# Patient Record
Sex: Male | Born: 1965 | Race: White | Hispanic: No | Marital: Single | Smoking: Former smoker
Health system: Southern US, Community
[De-identification: ages and names within clinical notes are randomized; demographics above are authoritative.]

## PROBLEM LIST (undated history)

## (undated) DIAGNOSIS — K429 Umbilical hernia without obstruction or gangrene: Secondary | ICD-10-CM

## (undated) DIAGNOSIS — G5790 Unspecified mononeuropathy of unspecified lower limb: Secondary | ICD-10-CM

## (undated) DIAGNOSIS — Z789 Other specified health status: Secondary | ICD-10-CM

## (undated) DIAGNOSIS — J302 Other seasonal allergic rhinitis: Secondary | ICD-10-CM

## (undated) DIAGNOSIS — S99929A Unspecified injury of unspecified foot, initial encounter: Secondary | ICD-10-CM

## (undated) HISTORY — DX: Umbilical hernia without obstruction or gangrene: K42.9

## (undated) HISTORY — DX: Unspecified mononeuropathy of unspecified lower limb: G57.90

## (undated) HISTORY — DX: Unspecified injury of unspecified foot, initial encounter: S99.929A

## (undated) HISTORY — DX: Other seasonal allergic rhinitis: J30.2

## (undated) HISTORY — PX: FOOT SURGERY: SHX648

---

## 2001-03-18 HISTORY — PX: FOOT ARTHROPLASTY: SHX1657

## 2001-05-27 ENCOUNTER — Emergency Department (HOSPITAL_COMMUNITY): Admission: EM | Admit: 2001-05-27 | Discharge: 2001-05-27 | Payer: Self-pay

## 2001-06-03 ENCOUNTER — Encounter: Payer: Self-pay | Admitting: Orthopedic Surgery

## 2001-06-03 ENCOUNTER — Observation Stay (HOSPITAL_COMMUNITY): Admission: RE | Admit: 2001-06-03 | Discharge: 2001-06-04 | Payer: Self-pay | Admitting: Orthopedic Surgery

## 2001-06-04 ENCOUNTER — Encounter: Payer: Self-pay | Admitting: Orthopedic Surgery

## 2001-07-01 ENCOUNTER — Inpatient Hospital Stay (HOSPITAL_COMMUNITY): Admission: EM | Admit: 2001-07-01 | Discharge: 2001-07-03 | Payer: Self-pay | Admitting: Emergency Medicine

## 2001-07-01 ENCOUNTER — Encounter: Payer: Self-pay | Admitting: Emergency Medicine

## 2001-07-03 ENCOUNTER — Encounter: Payer: Self-pay | Admitting: Internal Medicine

## 2003-03-19 HISTORY — PX: EYE SURGERY: SHX253

## 2008-07-04 ENCOUNTER — Encounter: Payer: Self-pay | Admitting: Family Medicine

## 2009-08-17 ENCOUNTER — Encounter: Payer: Self-pay | Admitting: Family Medicine

## 2010-02-22 ENCOUNTER — Ambulatory Visit: Payer: Self-pay | Admitting: Family Medicine

## 2010-02-22 DIAGNOSIS — G5790 Unspecified mononeuropathy of unspecified lower limb: Secondary | ICD-10-CM | POA: Insufficient documentation

## 2010-02-22 DIAGNOSIS — G609 Hereditary and idiopathic neuropathy, unspecified: Secondary | ICD-10-CM

## 2010-04-17 NOTE — Letter (Signed)
Summary: Patient Questionnaire  Patient Questionnaire   Imported By: Beau Fanny 02/22/2010 14:54:33  _____________________________________________________________________  External Attachment:    Type:   Image     Comment:   External Document

## 2010-04-17 NOTE — Letter (Signed)
Summary: [facility] Records  [facility] Records   Imported By: Beau Fanny 01/25/2010 13:55:42  _____________________________________________________________________  External Attachment:    Type:   Image     Comment:   External Document

## 2010-04-19 NOTE — Assessment & Plan Note (Signed)
Summary: NEW PT TO EST/CLE   Vital Signs:  Patient profile:   45 year old male Height:      68 inches Weight:      192.50 pounds BMI:     29.38 Temp:     97.5 degrees F oral Pulse rate:   72 / minute Pulse rhythm:   regular BP sitting:   136 / 88  (left arm) Cuff size:   regular  Vitals Entered By: Lewanda Rife LPN (February 22, 2010 11:37 AM)  Serial Vital Signs/Assessments:  Time      Position  BP       Pulse  Resp  Temp     By                     132/85                         Judith Part MD  CC: New pt to establish   History of Present Illness: here as a new pt  used to go to [place] [facility] -- does not remember who he saw -- she was moving back to [place] last month  just there for a year   is on disability for injury  had a beam fall on his foot (2 ton)  multiples fractures and surgeries  no further surgeries are planned  is on gabapentin -- and does fairly well with that  has been on it since 03 since the injury  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 if asthma   136/88 first check - and hx HTN family   some upper body exercise limited by his foot   last screening labs years ago  is not interested in health mt yet- perhaps in the future   is feeling ok now    Preventive Screening-Counseling & Management  Caffeine-Diet-Exercise     Does Patient Exercise: yes      Drug Use:  no.    Allergies (verified): 1)  ! Penicillin  Past History:  Family History: Last updated: 02/22/2010 Father living High blood pressure Mother living High blood pressure, diabetes Paternal grand mother: Heart disease Paternal grandfather: Heart disease uncle with cancer (? kind)   Social History: Last updated: 02/22/2010 Occupation:Employed self -- uses machine to  clean parking lots  Divorced Alcohol use-yes Drug use-no Regular exercise-yes quit smoking after 3 years -- many years ago in his 99s some upper body exercise   Risk Factors: Exercise: yes (02/22/2010)  Past Medical History: nasal seasonal allergies with cough  foot injury - partially disabled from  chronic neuropathy both legs -- thinks it was from working on hard floors and favoring R foot    ortho-- Dr Dareen Piano (for his foot)   Past Surgical History: 2003 - foot surgery with plate put in big toe and pin in 2nd toe due to 2 ton beam falling on foot. (pt had 2 surgeries on foot).  Family History: Father living High blood pressure Mother living High blood pressure, diabetes Paternal grand mother: Heart disease Paternal grandfather: Heart disease uncle with cancer (? 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 Eyes:  Denies blurring and eye irritation. CV:  Denies chest pain or discomfort and lightheadness. Resp:  Denies cough, shortness of breath, and wheezing. GI:  Denies abdominal pain, change in bowel habits, indigestion, and nausea. MS:  Complains of joint pain and stiffness; denies joint redness, joint swelling, and cramps. Derm:  Denies lesion(s), poor wound healing, and rash. Neuro:  Complains of numbness and tingling; denies headaches. Psych:  mood is generally ok . Endo:  Denies cold intolerance, excessive thirst, excessive urination, and heat intolerance. Heme:  Denies abnormal bruising and bleeding.  Physical Exam  General:  overweight but generally well appearing   Head:  normocephalic, atraumatic, and no abnormalities  observed.   Eyes:  vision grossly intact, pupils equal, pupils round, and pupils reactive to light.  no conjunctival pallor, injection or icterus  Mouth:  pharynx pink and moist.   Neck:  supple with full rom and no masses or thyromegally, no JVD or carotid bruit  Lungs:  Normal respiratory effort, chest expands symmetrically. Lungs are clear to auscultation, no crackles or wheezes. Heart:  Normal rate and regular rhythm. S1 and S2 normal without gallop, murmur, click, rub or other extra sounds. Abdomen:  Bowel sounds positive,abdomen soft and non-tender without masses, organomegaly or hernias noted. no renal bruits  Msk:  surgical changes and hyperpigmentation noted on L foot and first toe with high arch limited rom Pulses:  R and L carotid,radial,femoral,dorsalis pedis and posterior tibial pulses are full and equal bilaterally Extremities:  no CCE Neurologic:  dec sens to light touch in L foot   DTRs symmetrical and normal.   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 One capsule by mouth three times a day. 2)  Vitamin B-12 500 Mcg Tabs (Cyanocobalamin) .... One tablet by mouth twice a day 3)  Vitamin D 400 Unit Tabs (Cholecalciferol) .... Take 1 tablet by  mouth once a day 4)  Calcium Carbonate-vitamin D 600-400 Mg-unit Tabs (Calcium carbonate-vitamin d) .... One tablet by mouth twice a day 5)  Vitamin E 400 Unit Caps (Vitamin e) .... Take 1 capsule by mouth once a day  Contraindications/Deferment of Procedures/Staging:    Test/Procedure: FLU VAX    Reason for deferment: patient declined   Patient Instructions: 1)  please send for last 2 prog notes from the [place] clinic  2)  try to keep up good exercise to prevent high blood pressure 3)  also watch salt in diet    Orders Added: 1)  New Patient Level III [16109]    Current Allergies (reviewed today): ! PENICILLIN   Preventive Care Screening  Last Tetanus Booster:    Date:  05/16/2001    Results:  Td

## 2010-04-19 NOTE — Letter (Signed)
Summary: [facility] Records  [facility] Records   Imported By: Beau Fanny 03/01/2010 09:38:38  _____________________________________________________________________  External Attachment:    Type:   Image     Comment:   External Document

## 2010-04-25 ENCOUNTER — Ambulatory Visit: Payer: Self-pay | Admitting: Family Medicine

## 2010-04-25 DIAGNOSIS — Z0289 Encounter for other administrative examinations: Secondary | ICD-10-CM

## 2010-04-30 ENCOUNTER — Encounter: Payer: Self-pay | Admitting: Family Medicine

## 2010-04-30 ENCOUNTER — Ambulatory Visit (INDEPENDENT_AMBULATORY_CARE_PROVIDER_SITE_OTHER): Payer: MEDICARE | Admitting: Family Medicine

## 2010-04-30 DIAGNOSIS — G609 Hereditary and idiopathic neuropathy, unspecified: Secondary | ICD-10-CM

## 2010-05-09 NOTE — Assessment & Plan Note (Signed)
Vital Signs:  Patient profile:   45 year old male Height:      68 inches Weight:      195.50 pounds BMI:     29.83 Temp:     98.2 degrees F oral Pulse rate:   84 / minute Pulse rhythm:   regular BP sitting:   158 / 106  (left arm) Cuff size:   regular  Vitals Entered By: Lewanda Rife LPN (April 30, 2010 2:32 PM)  Serial Vital Signs/Assessments:  Time      Position  BP       Pulse  Resp  Temp     By                     144/88                         Judith Part MD  CC: Left foot pain, injured in 2003. Pt said toes on lt foot hurt to touch at times. Pt thinks neuropathy. Comments Pt said BP up due to pt rushing.   History of Present Illness: here for foot pain and neuropathy in past gabapentin was improving it  for past 2 weeks going more into first toe-- more sensitive to the touch right on the tip  was told by his prev surgery that he needs to get into neurologist to have a block  last nerve block was behind his knee- and it did help  this bothers him more when up and about  ? if he can take extra pill     was by a Dr Yolanda Bonine in [place] -- ? what specialty   did have his orthotics re done- that is helpful  Dr Dareen Piano is his ortho       bp is up - pt says he was rushing to get here  Allergies: 1)  ! Penicillin  Past History:  Past Medical History: Last updated: 02/22/2010 nasal seasonal allergies with cough  foot injury - partially disabled from  chronic neuropathy both legs -- thinks it was from working on hard floors and favoring R foot    ortho-- Dr Dareen Piano (for his foot)   Past Surgical History: Last updated: 02/22/2010 2003 - foot surgery with plate put in big toe and pin in 2nd toe due to 2 ton beam falling on foot. (pt had 2 surgeries on foot).  Family History: Last updated: 02/22/2010 Father living High blood pressure Mother living High blood pressure, diabetes Paternal grand mother: Heart disease Paternal grandfather: Heart  disease uncle with cancer (? 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 Eyes:  Denies blurring and eye irritation. CV:  Denies chest pain or discomfort, lightheadness, and palpitations. Resp:  Denies cough and wheezing. GI:  Denies abdominal pain and change in bowel habits. GU:  Denies dysuria and incontinence. MS:  Complains of joint pain; denies joint redness and joint swelling. Derm:  Denies rash. Neuro:  Complains of numbness and tingling; denies tremors. Heme:  Denies abnormal bruising and bleeding.  Physical Exam  General:  overweight but generally well appearing   Head:  normocephalic, atraumatic,  and no abnormalities observed.   Eyes:  vision grossly intact, pupils equal, pupils round, and pupils reactive to light.  no conjunctival pallor, injection or icterus  Neck:  supple with full rom and no masses or thyromegally, no JVD or carotid bruit  Lungs:  Normal respiratory effort, chest expands symmetrically. Lungs are clear to auscultation, no crackles or wheezes. Heart:  Normal rate and regular rhythm. S1 and S2 normal without gallop, murmur, click, rub or other extra sounds. 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 1-2 by mouth up to three times a day as needed neuropathy symptoms 2)  Vitamin B-12 500 Mcg Tabs (Cyanocobalamin) .... One tablet by mouth twice a day 3)  Vitamin D 400 Unit Tabs (Cholecalciferol) .... Take 1 tablet by mouth once a day 4)  Calcium Carbonate-vitamin D 600-400 Mg-unit Tabs (Calcium carbonate-vitamin d) .... One tablet by mouth twice a day 5)  Vitamin E 400 Unit Caps (Vitamin e) .... Take 1 capsule by mouth once a day  Patient Instructions: 1)  you can take 1-2 gabapentin up to three times a day  2)  you may be a bit sleepy or dizzy getting used to dose  3)  talk to [facility] about Dr Yolanda Bonine at check out - so we can figure out what kind of doctor he is (so I can do referral )    Orders Added: 1)  Pain Clinic Referral [Pain] 2)  Est. Patient Level III [66440]    Current Allergies (reviewed today): ! PENICILLIN

## 2010-05-15 ENCOUNTER — Encounter: Payer: Self-pay | Admitting: Family Medicine

## 2010-05-29 NOTE — Letter (Signed)
Summary: [facility]  [facility]   Imported By: Kassie Mends 05/22/2010 08:27:22  _____________________________________________________________________  External Attachment:    Type:   Image     Comment:   External Document

## 2010-08-03 NOTE — Op Note (Signed)
[facility]  Patient:    ANTWONE, CAPOZZOLI Visit Number: [id number] MRN: [id number]          Service Type: SUR Location: 4W [id number] [id number] Attending Physician:  Sherri Rad Dictated by:   Sherri Rad, M.D. Proc. Date: 06/03/01 Admit Date:  06/03/2001 Discharge Date: 06/04/2001                             Operative Report  PREOPERATIVE DIAGNOSES: 1. Crushed, comminuted left great toe proximal phalanx fracture, closed. 2. Crushed, comminuted left second toe proximal phalanx fracture, closed.  POSTOPERATIVE DIAGNOSES: 1. Crushed, comminuted left great toe proximal phalanx fracture, closed. 2. Crushed, comminuted left second toe proximal phalanx fracture, closed.  OPERATION: 1. Open reduction, internal fixation left closed great toe proximal phalanx    fracture. 2. Open reduction, internal fixation left second toe proximal phalanx    fracture, closed. 3. Left second toe EDB to EDL transfer.  ANESTHESIA:  General endotracheal tube.  SURGEON:  Sherri Rad, M.D.  ASSISTANTJill Side P. Mahar, P.A.-C.  ESTIMATED BLOOD LOSS:  Minimal.  TOURNIQUET TIME:  59 minutes.  COMPLICATIONS:  None.  DISPOSITION:  Stable to PAR.  INDICATIONS:  This is a 45 year old gentleman, who had a 10 ton steel beam fall on his steel-tipped boots on his left foot.  It brushed up against his pretibial area and crushed his great toe and second toe.  He was initially seen by Dr. Kristeen Miss, who referred him to me for further evaluation and treatment.  He has been keeping this elevated.  He was explained the risks of the procedure which include infection, neurovascular injury, malunion, nonunion, hardware irritation, hardware failure, possible hardware removal, DVT, possible PE, arthritis, stiffness, contracture, possible loss of toe were all explained.  Questions were answered.  DESCRIPTION OF OPERATION:  The patient brought to the operating room and placed in  the supine position.  After adequate general endotracheal tube anesthesia was administered as well as Ancef 1 g IV piggyback, the left lower extremity was then prepped and draped in a sterile manner over a proximally-placed thigh tourniquet.  The left lower extremity was gravity exsanguinated as well as exsanguinated with a six inch ACE wrap, and the tourniquet was elevated to 290 mmHg.  The procedure commenced with a dorsal incision just lateral to the EHL.  The EHL was identified and retracted medially.  Fracture site was identified; it was comminuted.  We performed a bridge plate across this area with a quarter tubular T-plate.  We cut the third hole off, making it a two hole T-plate.  We fixed this with 2.7 mm screws once it was provisionally fixed with a K-wire.  Once the plate was applied dorsally and C-arms were obtained in the AP and lateral planes and reduction was adequate with the bridge plate, we then removed the K-wire. Copiously irrigated the wound with normal saline and closed the subcu with 3-0 Vicryl.  Skin was closed with 4-0 nylon.  Toe was well-aligned clinically.  We then made a dorsal incision over the second toe.  Dissection was carried down to extensor tendons.  The EDL was tenotomized proximal-medial, and the EDB was tenotomized distolateral.  We then reflected the tendons.  We then identified the fracture site.  A 4-5 K-wire was then fired antegrade through the distal fragment.  We then open reduced the proximal phalanx.  There was a portion that was comminuted  medially at the base.  Once we reduced the main portion of the toe back to the proximal piece, this piece indirectly reduced, we then fired the K-wire across the MTP joint with the toe held reduced with the ankle in neutral dorsiflexion.  We then observed this under C-arm and in AP and lateral planes, and it was in adequate reduction.  We then copiously irrigated the wound with normal saline.  Skin was closed  with 4-0 nylon.  Skin-relieving incisions were made on either side of the K-wire.  The K-wire was bent to 90 degrees and cut.  Cap was applied.  Sterile dressings were applied.  Jones dressing was applied.  The patient was stable to the PAR. Dictated by:   Sherri Rad, M.D. Attending Physician:  Sherri Rad DD:  06/03/01 TD:  06/04/01 Job: [id number]

## 2010-11-13 ENCOUNTER — Encounter: Payer: Self-pay | Admitting: Family Medicine

## 2010-11-14 ENCOUNTER — Encounter: Payer: Self-pay | Admitting: Family Medicine

## 2010-11-14 ENCOUNTER — Ambulatory Visit (INDEPENDENT_AMBULATORY_CARE_PROVIDER_SITE_OTHER): Payer: Medicare Other | Admitting: Family Medicine

## 2010-11-14 DIAGNOSIS — K429 Umbilical hernia without obstruction or gangrene: Secondary | ICD-10-CM

## 2010-11-14 DIAGNOSIS — G609 Hereditary and idiopathic neuropathy, unspecified: Secondary | ICD-10-CM

## 2010-11-14 HISTORY — DX: Umbilical hernia without obstruction or gangrene: K42.9

## 2010-11-14 NOTE — Progress Notes (Signed)
Subjective:    Patient ID: George Fowler, male    DOB: 1965-06-12, 45 y.o.   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 PERIPHERAL NEUROPATHY, FEET  . ELEVATED BLOOD PRESSURE WITHOUT DIAGNOSIS OF HYPERTENSION  . Umbilical hernia   Past Medical History  Diagnosis Date  . Seasonal allergic rhinitis     with cough  . Foot injury     partially disabled from  . Neuropathy of leg     Chronic; bilateral--thinks it was from working on hard floors and favoring right foot   Past Surgical History  Procedure Date  . Foot surgery 2003    plate in big toe; pin in 2nd toe due to 2 ton beam falling on foot (2 surgeries on foot)   History  Substance Use Topics  . Smoking status: Former Games developer  . Smokeless tobacco: Not on file   Comment: quit after smoking x 3 years---in his 20's  . Alcohol Use: Yes   Family History  Problem Relation Age of Onset  . Hypertension Father   . Hypertension Mother   . Diabetes Mother   . Heart disease Paternal Grandmother   . Heart disease Paternal Grandfather   . Cancer      uncle---unsure of what kind   Allergies  Allergen Reactions  . Penicillins     REACTION: rash   Current Outpatient Prescriptions on File Prior to Visit  Medication Sig Dispense Refill  . Calcium Carbonate-Vitamin D (CALCIUM 600+D) 600-400 MG-UNIT per tablet Take 1 tablet by  mouth 2 (two) times daily.        . cholecalciferol (VITAMIN D) 400 UNITS TABS Take 400 Units by mouth daily.        Marland Kitchen gabapentin (NEURONTIN) 300 MG capsule Take 300-600 mg by mouth 3 (three) times daily as needed.        . vitamin B-12 (CYANOCOBALAMIN) 500 MCG tablet Take 500 mcg by mouth 2 (two) times daily.        . vitamin E 400 UNIT capsule Take 400 Units by mouth daily.             Review of Systems Review of Systems  Constitutional: Negative for fever, appetite change, fatigue and unexpected weight change.  Eyes: Negative for pain and visual disturbance.  Respiratory: Negative for cough and shortness of breath.   Cardiovascular: Negative.for cp or palpitations   Gastrointestinal: Negative for nausea, diarrhea and constipation.  Genitourinary: Negative for urgency and frequency.  Skin: Negative for pallor. or rash  Neurological: Negative for weakness, light-headedness, numbness and headaches. pos for numbness and burning in his L foot -baseline Hematological: Negative for adenopathy. Does not bruise/bleed easily.  Psychiatric/Behavioral: Negative for dysphoric mood. The patient is not nervous/anxious.  Objective:   Physical Exam  Constitutional: He appears well-developed and well-nourished.  HENT:  Head: Normocephalic and atraumatic.  Mouth/Throat: Oropharynx is clear and moist.  Eyes: Conjunctivae and EOM are normal. Pupils are equal, round, and reactive to light.  Neck: Normal range of motion. Neck supple. No JVD present. Carotid bruit is not present.  Cardiovascular: Normal rate, regular rhythm, normal heart sounds and intact distal pulses.   Pulmonary/Chest: Effort normal and breath sounds normal. No respiratory distress. He has no wheezes.  Abdominal: Soft. Bowel sounds are normal. He exhibits no distension and no mass. There is no tenderness.       Small umbilical hernia 1-2 cm  Is reducible and entirely nontender No renal bruits   Musculoskeletal: He  exhibits tenderness. He exhibits no edema.       L ankle and foot are tender with limited rom  Also dec sensation but can feel monofilament  Lymphadenopathy:    He has no cervical adenopathy.  Neurological: He is alert. He has normal reflexes. He exhibits normal muscle tone. Coordination normal.       No tremor   Skin: Skin is warm and dry. No rash noted. No erythema. No pallor.  Psychiatric: He has a normal mood and affect.          Assessment & Plan:

## 2010-11-14 NOTE — Patient Instructions (Signed)
Watch the umbilical hernia- if it gets gets larger/ painful - let me know  Keep working on weight loss Foot seems stable  If you want to do DOT physical - make appt and bring forms at that time

## 2010-11-14 NOTE — Assessment & Plan Note (Signed)
Very small and nl exam today  Recommend further wt loss Will update if he develops pain or it gets larger

## 2010-11-14 NOTE — Assessment & Plan Note (Signed)
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

## 2010-11-30 ENCOUNTER — Encounter: Payer: Medicare Other | Admitting: Family Medicine

## 2011-07-30 ENCOUNTER — Ambulatory Visit (INDEPENDENT_AMBULATORY_CARE_PROVIDER_SITE_OTHER): Payer: Medicare Other | Admitting: Family Medicine

## 2011-07-30 ENCOUNTER — Encounter: Payer: Self-pay | Admitting: Family Medicine

## 2011-07-30 VITALS — BP 118/80 | HR 64 | Temp 97.8°F | Ht 68.0 in | Wt 193.2 lb

## 2011-07-30 DIAGNOSIS — K429 Umbilical hernia without obstruction or gangrene: Secondary | ICD-10-CM

## 2011-07-30 NOTE — Progress Notes (Signed)
Subjective:    Patient ID: George Fowler, male    DOB: 1966/02/07, 46 y.o.   MRN: [id number]  HPI Here for his umbilical hernia -- is more palpable and visible and is bothering him more  ? What caused it  Tender? At times - when he touches it just the right way  Has had it about a year   Has lost some weight over the years- is still there   Is more and more concerned over time  Is still trying to loose wt  Does like to work out and lift weights   Patient Active Problem List  Diagnoses  . PERIPHERAL NEUROPATHY, FEET  . ELEVATED BLOOD PRESSURE WITHOUT DIAGNOSIS OF HYPERTENSION  . Umbilical hernia   Past Medical History  Diagnosis Date  . Seasonal allergic rhinitis     with cough  . Foot injury     partially disabled from  . Neuropathy of leg     Chronic; bilateral--thinks it was from working on hard floors and favoring right foot   Past Surgical History  Procedure Date  . Foot surgery 2003    plate in big toe; pin in 2nd toe due to 2 ton beam falling on foot (2 surgeries on foot)   History  Substance Use Topics  . Smoking status: Former Games developer  . Smokeless tobacco: Not on file   Comment: quit after smoking x 3 years---in his 20's  . Alcohol Use: Yes   Family History  Problem Relation Age of Onset  . Hypertension Father   . Hypertension Mother   . Diabetes Mother   . Heart disease Paternal Grandmother   . Heart disease Paternal Grandfather   . Cancer      uncle---unsure of what kind   Allergies  Allergen Reactions  . Penicillins     REACTION: rash   Current Outpatient Prescriptions on File Prior to Visit  Medication Sig Dispense Refill  . Calcium Carbonate-Vitamin D (CALCIUM 600+D) 600-400 MG-UNIT per tablet Take 1 tablet by mouth 2 (two) times daily.        . cholecalciferol (VITAMIN D) 400 UNITS TABS Take 400 Units by mouth daily.        Marland Kitchen gabapentin (NEURONTIN) 300 MG capsule Take 300-600 mg by mouth 3 (three) times daily as needed.        .  vitamin B-12 (CYANOCOBALAMIN) 500 MCG tablet Take 500 mcg by mouth 2 (two) times daily.        . vitamin E 400 UNIT capsule Take 400 Units by mouth daily.            Review of Systems Review of Systems  Constitutional: Negative for fever, appetite change, fatigue and unexpected weight change.  Eyes: Negative for pain and visual disturbance.  Respiratory: Negative for cough and shortness of breath.   Cardiovascular: Negative for cp or palpitations    Gastrointestinal: Negative for nausea, diarrhea and constipation.pos for pain intermittently over umbilical hernea   Genitourinary: Negative for urgency and frequency. neg for groin hernias  Skin: Negative for pallor or rash   Neurological: Negative for weakness, light-headedness, numbness and headaches.  Hematological: Negative for adenopathy. Does not bruise/bleed easily.  Psychiatric/Behavioral: Negative for dysphoric mood. The patient is not nervous/anxious.         Objective:   Physical Exam  Constitutional: He appears well-developed and well-nourished. No distress.       Obese and well appearing   HENT:  Head: Normocephalic and atraumatic.  Eyes:  Conjunctivae and EOM are normal. Pupils are equal, round, and reactive to light.  Neck: Neck supple. No JVD present. No thyromegaly present.  Cardiovascular: Normal rate, regular rhythm, normal heart sounds and intact distal pulses.   Pulmonary/Chest: Effort normal and breath sounds normal. No respiratory distress. He has no wheezes.  Abdominal: Soft. Bowel sounds are normal. He exhibits no distension and no mass. There is no guarding.       Relatively small umbilical hernia 1-3 cm - more noticeable with upright position/ is reducible/ not incarcerated Is milldy sensitive/ tender  Neurological: He is alert.  Skin: Skin is warm and dry. No erythema. No pallor.  Psychiatric: He has a normal mood and affect.          Assessment & Plan:

## 2011-07-30 NOTE — Patient Instructions (Signed)
We will do a referral at check out to discuss umbilical hernia  If pain or size of hernia increase in the meantime - let me know

## 2011-07-30 NOTE — Assessment & Plan Note (Signed)
Small umbilical hernia that is bothersome to patient - without evidence of strangulation/ incarceration  He desires further eval  Will refer to general surgeon for eval Adv to update [place] if symptoms worsen in meantime Did disc need for wt loss

## 2011-08-16 ENCOUNTER — Ambulatory Visit (INDEPENDENT_AMBULATORY_CARE_PROVIDER_SITE_OTHER): Payer: Medicare Other | Admitting: Surgery

## 2011-08-23 ENCOUNTER — Ambulatory Visit (INDEPENDENT_AMBULATORY_CARE_PROVIDER_SITE_OTHER): Payer: Medicare Other | Admitting: Surgery

## 2011-08-23 ENCOUNTER — Encounter (INDEPENDENT_AMBULATORY_CARE_PROVIDER_SITE_OTHER): Payer: Self-pay | Admitting: Surgery

## 2011-08-23 VITALS — BP 130/90 | HR 60 | Temp 97.1°F | Resp 14 | Ht 70.0 in | Wt 188.2 lb

## 2011-08-23 DIAGNOSIS — M6208 Separation of muscle (nontraumatic), other site: Secondary | ICD-10-CM | POA: Insufficient documentation

## 2011-08-23 DIAGNOSIS — K429 Umbilical hernia without obstruction or gangrene: Secondary | ICD-10-CM

## 2011-08-23 NOTE — Patient Instructions (Signed)
We will schedule surgery to repair the hernia in your belly button

## 2011-08-23 NOTE — Progress Notes (Signed)
Patient ID: George Fowler, male   DOB: [birth year], 46 y.o.   MRN: [id number]  Chief Complaint  Patient presents with  . Umbilical Hernia    New pt. Eval Umb Hernia    HPI George Fowler is a 46 y.o. male.  He thinks he has had an umbilical hernia for about a year. He is lost some weight and has become more noticeable. It is more pronounced when he does sit-ups or other exercises. It is occasional a little tender. He always goes back in when he lies down. He is interested in having it repaired because it has become more symptomatic. This never becomes incarcerated. HPI  Past Medical History  Diagnosis Date  . Seasonal allergic rhinitis     with cough  . Foot injury     partially disabled from  . Neuropathy of leg     Chronic; bilateral--thinks it was from working on hard floors and favoring right foot    Past Surgical History  Procedure Date  . Foot surgery 2003    plate in big toe; pin in 2nd toe due to 2 ton beam falling on foot (2 surgeries on foot)    Family History  Problem Relation Age of Onset  . Hypertension Father   . Hypertension Mother   . Diabetes Mother   . Heart disease Paternal Grandmother   . Heart disease Paternal Grandfather   . Cancer      uncle---unsure of what kind    Social History History  Substance Use Topics  . Smoking status: Former Smoker    Quit date: 03/18/1990  . Smokeless tobacco: Never Used   Comment: quit after smoking x 3 years---in his 20's  . Alcohol Use: 0.0 oz/week    1-2 Cans of beer per week     1-2 per night    Allergies  Allergen Reactions  . Penicillins     REACTION: rash    Current Outpatient Prescriptions  Medication Sig Dispense Refill  . Calcium Carbonate-Vitamin D (CALCIUM 600+D) 600-400 MG-UNIT per tablet Take 1 tablet by mouth 2 (two) times daily.        . cholecalciferol (VITAMIN D) 400 UNITS TABS Take 400 Units by mouth daily.        Marland Kitchen gabapentin (NEURONTIN) 300 MG capsule Take 300-600 mg by mouth 3  (three) times daily as needed.        . vitamin B-12 (CYANOCOBALAMIN) 500 MCG tablet Take 500 mcg by mouth 2 (two) times daily.        . vitamin E 400 UNIT capsule Take 400 Units by mouth daily.          Review of Systems Review of Systems  Constitutional: Negative for fever, chills and unexpected weight change.  HENT: Negative for hearing loss, congestion, sore throat, trouble swallowing and voice change.   Eyes: Negative for visual disturbance.  Respiratory: Negative for cough and wheezing.   Cardiovascular: Negative for chest pain, palpitations and leg swelling.  Gastrointestinal: Negative for nausea, vomiting, abdominal pain, diarrhea, constipation, blood in stool, abdominal distention, anal bleeding and rectal pain.  Genitourinary: Negative for hematuria and difficulty urinating.  Musculoskeletal: Negative for arthralgias.  Skin: Negative for rash and wound.  Neurological: Negative for seizures, syncope, weakness and headaches.  Hematological: Negative for adenopathy. Does not bruise/bleed easily.  Psychiatric/Behavioral: Negative for confusion.    Blood pressure 130/90, pulse 60, temperature 97.1 F (36.2 C), temperature source Temporal, resp. rate 14, height 5\' 10"  (1.778 m),  weight 188 lb 3.2 oz (85.367 kg).  Physical Exam Physical Exam  Vitals reviewed. Constitutional: He is oriented to person, place, and time. He appears well-developed and well-nourished. No distress.  HENT:  Head: Normocephalic and atraumatic.  Eyes: Conjunctivae and EOM are normal. Pupils are equal, round, and reactive to light. Left eye exhibits no discharge.  Neck: Normal range of motion. Neck supple. No tracheal deviation present. No thyromegaly present.  Cardiovascular: Normal rate, regular rhythm, normal heart sounds and intact distal pulses.  Exam reveals no gallop and no friction rub.   No murmur heard. Pulmonary/Chest: Effort normal and breath sounds normal. No respiratory distress. He has no  wheezes. He has no rales.  Abdominal: Soft. Bowel sounds are normal. He exhibits no distension and no mass. There is no tenderness. There is no rebound and no guarding.       There is a small easily reducible umbilical hernia. It presents more towards the top of the umbilicus. He also has a diastasis recti  Musculoskeletal: Normal range of motion. He exhibits no edema.  Neurological: He is alert and oriented to person, place, and time.  Skin: Skin is warm and dry. No erythema.  Psychiatric: He has a normal mood and affect. His behavior is normal. Judgment and thought content normal.    Data Reviewed Notes from Dr Milinda Antis in Epic  Assessment    Reducible, symptomatic umbilical hernia; diastasis recti    Plan    Repair as an OP, general anesthesia. I have discussed with the patient the fact that he has an umbilical hernia and reviewed the pathophysiology of that diagnosis. I have given him educational materials about this. I discussed options for treatment including observation or repair and have discussed both laparoscopic and open inguinal hernia repairs as potential techniques. We have discussed the use of mesh. We have discussed risks of surgery including bleeding, infection, recurrence, postoperative pain and possible chronic groin pain, testicular injury, and potential issues with voiding. I believe the patient's questions have been answered and that he has a good understanding of the issues        George Fowler 08/23/2011, 12:10 PM

## 2011-09-23 ENCOUNTER — Encounter (HOSPITAL_BASED_OUTPATIENT_CLINIC_OR_DEPARTMENT_OTHER): Payer: Self-pay | Admitting: *Deleted

## 2011-09-23 NOTE — Progress Notes (Signed)
No labs needed Denies ht or resp problems

## 2011-09-25 ENCOUNTER — Ambulatory Visit (HOSPITAL_BASED_OUTPATIENT_CLINIC_OR_DEPARTMENT_OTHER)
Admission: RE | Admit: 2011-09-25 | Discharge: 2011-09-25 | Disposition: A | Discharge status: Home / Self Care | Payer: Medicare Other | Source: Ambulatory Visit | Attending: Surgery | Admitting: Surgery

## 2011-09-25 ENCOUNTER — Ambulatory Visit (HOSPITAL_BASED_OUTPATIENT_CLINIC_OR_DEPARTMENT_OTHER): Payer: Medicare Other | Admitting: Anesthesiology

## 2011-09-25 ENCOUNTER — Encounter (HOSPITAL_BASED_OUTPATIENT_CLINIC_OR_DEPARTMENT_OTHER): Payer: Self-pay | Admitting: *Deleted

## 2011-09-25 ENCOUNTER — Encounter (HOSPITAL_BASED_OUTPATIENT_CLINIC_OR_DEPARTMENT_OTHER)
Admission: RE | Disposition: A | Discharge status: Home / Self Care | Payer: Self-pay | Source: Ambulatory Visit | Attending: Surgery

## 2011-09-25 ENCOUNTER — Encounter (HOSPITAL_BASED_OUTPATIENT_CLINIC_OR_DEPARTMENT_OTHER): Payer: Self-pay | Admitting: Anesthesiology

## 2011-09-25 DIAGNOSIS — K429 Umbilical hernia without obstruction or gangrene: Secondary | ICD-10-CM

## 2011-09-25 DIAGNOSIS — G5790 Unspecified mononeuropathy of unspecified lower limb: Secondary | ICD-10-CM | POA: Insufficient documentation

## 2011-09-25 HISTORY — PX: UMBILICAL HERNIA REPAIR: SHX196

## 2011-09-25 HISTORY — DX: Other specified health status: Z78.9

## 2011-09-25 SURGERY — REPAIR, HERNIA, UMBILICAL, ADULT
Anesthesia: General | Site: Abdomen | Wound class: Clean

## 2011-09-25 MED ORDER — PROPOFOL 10 MG/ML IV EMUL
INTRAVENOUS | Status: DC | PRN
  Administered 2011-09-25: 200 mg via INTRAVENOUS

## 2011-09-25 MED ORDER — FENTANYL CITRATE 0.05 MG/ML IJ SOLN
INTRAMUSCULAR | Status: DC | PRN
  Administered 2011-09-25 (×2): 50 ug via INTRAVENOUS

## 2011-09-25 MED ORDER — CIPROFLOXACIN IN D5W 400 MG/200ML IV SOLN
400.0000 mg | Freq: Once | INTRAVENOUS | Status: AC
  Administered 2011-09-25 (×2): 400 mg via INTRAVENOUS

## 2011-09-25 MED ORDER — MIDAZOLAM HCL 5 MG/5ML IJ SOLN
INTRAMUSCULAR | Status: DC | PRN
  Administered 2011-09-25: 2 mg via INTRAVENOUS

## 2011-09-25 MED ORDER — LACTATED RINGERS IV SOLN
INTRAVENOUS | Status: DC
  Administered 2011-09-25: 08:00:00 via INTRAVENOUS

## 2011-09-25 MED ORDER — CHLORHEXIDINE GLUCONATE 4 % EX LIQD: 1.0000 "application " | Freq: Once | CUTANEOUS | Status: DC

## 2011-09-25 MED ORDER — BUPIVACAINE HCL (PF) 0.25 % IJ SOLN
INTRAMUSCULAR | Status: DC | PRN
  Administered 2011-09-25: 17.5 mL

## 2011-09-25 MED ORDER — ONDANSETRON HCL 4 MG/2ML IJ SOLN
INTRAMUSCULAR | Status: DC | PRN
  Administered 2011-09-25: 4 mg via INTRAVENOUS

## 2011-09-25 MED ORDER — HYDROCODONE-ACETAMINOPHEN 5-325 MG PO TABS: 1.0000 | ORAL_TABLET | ORAL | Status: AC | PRN

## 2011-09-25 MED ORDER — LIDOCAINE HCL (CARDIAC) 20 MG/ML IV SOLN
INTRAVENOUS | Status: DC | PRN
  Administered 2011-09-25: 100 mg via INTRAVENOUS

## 2011-09-25 MED ORDER — HYDROMORPHONE HCL PF 1 MG/ML IJ SOLN: 0.2500 mg | INTRAMUSCULAR | Status: DC | PRN

## 2011-09-25 MED ORDER — DEXAMETHASONE SODIUM PHOSPHATE 4 MG/ML IJ SOLN
INTRAMUSCULAR | Status: DC | PRN
  Administered 2011-09-25: 10 mg via INTRAVENOUS

## 2011-09-25 SURGICAL SUPPLY — 58 items
ADH SKN CLS APL DERMABOND .7 (GAUZE/BANDAGES/DRESSINGS) ×1
APL SKNCLS STERI-STRIP NONHPOA (GAUZE/BANDAGES/DRESSINGS)
BALL CTTN LRG ABS STRL LF (GAUZE/BANDAGES/DRESSINGS) ×1
BENZOIN TINCTURE PRP APPL 2/3 (GAUZE/BANDAGES/DRESSINGS) IMPLANT
BLADE HEX COATED 2.75 (ELECTRODE) ×2 IMPLANT
BLADE SURG 15 STRL LF DISP TIS (BLADE) ×1 IMPLANT
BLADE SURG 15 STRL SS (BLADE) ×2
BLADE SURG ROTATE 9660 (MISCELLANEOUS) ×1 IMPLANT
CANISTER SUCTION 1200CC (MISCELLANEOUS) IMPLANT
CHLORAPREP W/TINT 26ML (MISCELLANEOUS) ×2 IMPLANT
CLOTH BEACON ORANGE TIMEOUT ST (SAFETY) ×2 IMPLANT
COTTONBALL LRG STERILE PKG (GAUZE/BANDAGES/DRESSINGS) ×2 IMPLANT
COVER MAYO STAND STRL (DRAPES) ×2 IMPLANT
COVER TABLE BACK 60X90 (DRAPES) ×2 IMPLANT
DECANTER SPIKE VIAL GLASS SM (MISCELLANEOUS) ×2 IMPLANT
DERMABOND ADVANCED (GAUZE/BANDAGES/DRESSINGS) ×1
DERMABOND ADVANCED .7 DNX12 (GAUZE/BANDAGES/DRESSINGS) IMPLANT
DRAPE PED LAPAROTOMY (DRAPES) ×2 IMPLANT
DRAPE UTILITY XL STRL (DRAPES) ×2 IMPLANT
ELECT REM PT RETURN 9FT ADLT (ELECTROSURGICAL) ×2
ELECTRODE REM PT RTRN 9FT ADLT (ELECTROSURGICAL) ×1 IMPLANT
GAUZE SPONGE 4X4 12PLY STRL LF (GAUZE/BANDAGES/DRESSINGS) ×4 IMPLANT
GLOVE BIO SURGEON STRL SZ7 (GLOVE) ×2 IMPLANT
GLOVE BIOGEL PI IND STRL 7.0 (GLOVE) IMPLANT
GLOVE BIOGEL PI INDICATOR 7.0 (GLOVE) ×1
GLOVE EUDERMIC 7 POWDERFREE (GLOVE) ×2 IMPLANT
GOWN PREVENTION PLUS XLARGE (GOWN DISPOSABLE) ×4 IMPLANT
GOWN PREVENTION PLUS XXLARGE (GOWN DISPOSABLE) ×1 IMPLANT
MESH HERNIA 3X6 (Mesh General) ×1 IMPLANT
NDL HYPO 25X1 1.5 SAFETY (NEEDLE) ×1 IMPLANT
NEEDLE HYPO 25X1 1.5 SAFETY (NEEDLE) ×2 IMPLANT
NS IRRIG 1000ML POUR BTL (IV SOLUTION) ×1 IMPLANT
PACK BASIN DAY SURGERY FS (CUSTOM PROCEDURE TRAY) ×2 IMPLANT
PENCIL BUTTON HOLSTER BLD 10FT (ELECTRODE) ×2 IMPLANT
SLEEVE SCD COMPRESS KNEE MED (MISCELLANEOUS) ×1 IMPLANT
SPONGE LAP 4X18 X RAY DECT (DISPOSABLE) ×2 IMPLANT
STRIP CLOSURE SKIN 1/2X4 (GAUZE/BANDAGES/DRESSINGS) IMPLANT
SUT CHROMIC 2 0 SH (SUTURE) IMPLANT
SUT MNCRL AB 4-0 PS2 18 (SUTURE) ×2 IMPLANT
SUT PROLENE 0 CT 1 CR/8 (SUTURE) ×2 IMPLANT
SUT PROLENE 0 CT 2 (SUTURE) IMPLANT
SUT PROLENE 2 0 CT2 30 (SUTURE) IMPLANT
SUT SILK 2 0 TIES 17X18 (SUTURE)
SUT SILK 2-0 18XBRD TIE BLK (SUTURE) IMPLANT
SUT SILK 4 0 TIES 17X18 (SUTURE) IMPLANT
SUT VIC AB 2-0 CT1 27 (SUTURE)
SUT VIC AB 2-0 CT1 TAPERPNT 27 (SUTURE) IMPLANT
SUT VIC AB 3-0 54X BRD REEL (SUTURE) IMPLANT
SUT VIC AB 3-0 BRD 54 (SUTURE)
SUT VIC AB 3-0 CT1 27 (SUTURE) ×2
SUT VIC AB 3-0 CT1 27XBRD (SUTURE) ×1 IMPLANT
SUT VICRYL 3-0 CR8 SH (SUTURE) IMPLANT
SYR CONTROL 10ML LL (SYRINGE) ×2 IMPLANT
TOWEL OR 17X24 6PK STRL BLUE (TOWEL DISPOSABLE) ×2 IMPLANT
TOWEL OR NON WOVEN STRL DISP B (DISPOSABLE) ×1 IMPLANT
TUBE CONNECTING 20X1/4 (TUBING) IMPLANT
WATER STERILE IRR 1000ML POUR (IV SOLUTION) ×1 IMPLANT
YANKAUER SUCT BULB TIP NO VENT (SUCTIONS) IMPLANT

## 2011-09-25 NOTE — Anesthesia Postprocedure Evaluation (Signed)
  Anesthesia Post-op Note  Patient: George Fowler  Procedure(s) Performed: Procedure(s) (LRB): HERNIA REPAIR UMBILICAL ADULT (N/A)  Patient Location: PACU  Anesthesia Type: General  Level of Consciousness: awake and alert   Airway and Oxygen Therapy: Patient Spontanous Breathing and Patient connected to nasal cannula oxygen  Post-op Pain: none  Post-op Assessment: Post-op Vital signs reviewed, Patient's Cardiovascular Status Stable, Respiratory Function Stable, Patent Airway and No signs of Nausea or vomiting  Post-op Vital Signs: Reviewed and stable  Complications: No apparent anesthesia complications

## 2011-09-25 NOTE — Op Note (Signed)
George Fowler 1965/04/20 [id number] 08/23/2011  Preoperative diagnosis: Umbilical hernia   Postoperative diagnosis: same  Procedure: Umbilical Hernia Repair  Surgeon: Currie Paris, MD, FACS  Assistant: Charissa Bash, MSIII  Anesthesia: General Clinical History and Indications: Patient presents with a symptomatic umbilical hernia  Procedure: The patient was seen in the preoperative area and the plans for the procedure reviewed again. He  had no further questions. I marked the area of the umbilicus as the operative site.  The patient was taken to the operating room and after satisfactory General anesthesia had been obtained the area was clipped as needed, prepped and draped. The timeout was performed.  I used some 0.25% marcaine anesthesia to help with postoperative pain management. This was infiltrated around the umbilical area and additional infiltrated as I worked.  A curvilinear incision was made on the inferior aspect of the umbilicus. The umbilical skin was elevated off of the hernia sac. The hernia sac was dissected free of the subcutaneous tissues.  The defect was less than a centimeter and contained some pre-peritoneal fat which was reduced. The sub-q was elevated off the fascia in all directiona and I made sure there was no secondary defect higher up. The defect was then closed with three sutures of 0 prolene with a small Bard mesh plug placed in the defect. Once the repair was complete the incision was closed by using some 3-0 Vicryl subcutaneous and 4-0 Monocryl subcuticular sutures.  The patient tolerated the procedure well. There were no operative complications. There was minimal blood loss. All counts were correct. He was taken to the PACU in satisfactory condition.  Currie Paris, MD, FACS 09/25/2011 9:15 AM

## 2011-09-25 NOTE — Transfer of Care (Signed)
Immediate Anesthesia Transfer of Care Note  Patient: George Fowler  Procedure(s) Performed: Procedure(s) (LRB): HERNIA REPAIR UMBILICAL ADULT (N/A)  Patient Location: PACU  Anesthesia Type: General  Level of Consciousness: awake and alert   Airway & Oxygen Therapy: Patient Spontanous Breathing and Patient connected to face mask oxygen  Post-op Assessment: Report given to PACU RN and Post -op Vital signs reviewed and stable  Post vital signs: Reviewed and stable  Complications: No apparent anesthesia complications

## 2011-09-25 NOTE — H&P (Signed)
Patient ID: George Fowler, male DOB: 02-10-1966, 46 y.o. MRN: [id number]  Chief Complaint   Patient presents with   .  Umbilical Hernia     New pt. Eval Umb Hernia   HPI  George Fowler is a 46 y.o. male. He was seen in the office and scheduled for surgery today. He has had no intercurrent problems.   Past Medical History   Diagnosis  Date   .  Seasonal allergic rhinitis      with cough   .  Foot injury      partially disabled from   .  Neuropathy of leg      Chronic; bilateral--thinks it was from working on hard floors and favoring right foot    Past Surgical History   Procedure  Date   .  Foot surgery  2003     plate in big toe; pin in 2nd toe due to 2 ton beam falling on foot (2 surgeries on foot)    Family History   Problem  Relation  Age of Onset   .  Hypertension  Father    .  Hypertension  Mother    .  Diabetes  Mother    .  Heart disease  Paternal Grandmother    .  Heart disease  Paternal Grandfather    .  Cancer        uncle---unsure of what kind   Social History  History   Substance Use Topics   .  Smoking status:  Former Smoker     Quit date:  03/18/1990   .  Smokeless tobacco:  Never Used     Comment: quit after smoking x 3 years---in his 20's    .  Alcohol Use:  0.0 oz/week     1-2 Cans of beer per week      1-2 per night    Allergies   Allergen  Reactions   .  Penicillins      REACTION: rash    Current Outpatient Prescriptions   Medication  Sig  Dispense  Refill   .  Calcium Carbonate-Vitamin D (CALCIUM 600+D) 600-400 MG-UNIT per tablet  Take 1 tablet by mouth 2 (two) times daily.     .  cholecalciferol (VITAMIN D) 400 UNITS TABS  Take 400 Units by mouth daily.     Marland Kitchen  gabapentin (NEURONTIN) 300 MG capsule  Take 300-600 mg by mouth 3 (three) times daily as needed.     .  vitamin B-12 (CYANOCOBALAMIN) 500 MCG tablet  Take 500 mcg by mouth 2 (two) times daily.     .  vitamin E 400 UNIT capsule  Take 400 Units by mouth daily.     Review of  Systems  Review of Systems  Constitutional: Negative for fever, chills and unexpected weight change.  HENT: Negative for hearing loss, congestion, sore throat, trouble swallowing and voice change.  Eyes: Negative for visual disturbance.  Respiratory: Negative for cough and wheezing.  Cardiovascular: Negative for chest pain, palpitations and leg swelling.  Gastrointestinal: Negative for nausea, vomiting, abdominal pain, diarrhea, constipation, blood in stool, abdominal distention, anal bleeding and rectal pain.  Genitourinary: Negative for hematuria and difficulty urinating.  Musculoskeletal: Negative for arthralgias.  Skin: Negative for rash and wound.  Neurological: Negative for seizures, syncope, weakness and headaches.  Hematological: Negative for adenopathy. Does not bruise/bleed easily.  Psychiatric/Behavioral: Negative for confusion.  Blood pressure 130/90, pulse 60, temperature 97.1 F (36.2 C), temperature source Temporal, resp.  rate 14, height 5\' 10"  (1.778 m), weight 188 lb 3.2 oz (85.367 kg).  Physical Exam  Physical Exam  Vitals reviewed.  Constitutional: He is oriented to person, place, and time. He appears well-developed and well-nourished. No distress.  HENT:  Head: Normocephalic and atraumatic.  Eyes: Conjunctivae and EOM are normal. Pupils are equal, round, and reactive to light. Left eye exhibits no discharge.  Neck: Normal range of motion. Neck supple. No tracheal deviation present. No thyromegaly present.  Cardiovascular: Normal rate, regular rhythm, normal heart sounds and intact distal pulses. Exam reveals no gallop and no friction rub.  No murmur heard.  Pulmonary/Chest: Effort normal and breath sounds normal. No respiratory distress. He has no wheezes. He has no rales.  Abdominal: Soft. Bowel sounds are normal. He exhibits no distension and no mass. There is no tenderness. There is no rebound and no guarding.  There is a small easily reducible umbilical hernia. It  presents more towards the top of the umbilicus. He also has a diastasis recti  Musculoskeletal: Normal range of motion. He exhibits no edema.  Neurological: He is alert and oriented to person, place, and time.  Skin: Skin is warm and dry. No erythema.  Psychiatric: He has a normal mood and affect. His behavior is normal. Judgment and thought content normal.  Data Reviewed  Notes from Dr Milinda Antis in Epic  Assessment  Reducible, symptomatic umbilical hernia; diastasis recti  Plan  Surgical repair today. I have again reviewed the plans with the patient and answered questions. He wishes to proceed.

## 2011-09-25 NOTE — Anesthesia Procedure Notes (Signed)
Procedure Name: LMA Insertion Date/Time: 09/25/2011 8:35 AM Performed by: Zenia Resides D Pre-anesthesia Checklist: Patient identified, Emergency Drugs available, Suction available, Patient being monitored and Timeout performed Patient Re-evaluated:Patient Re-evaluated prior to inductionOxygen Delivery Method: Circle System Utilized Preoxygenation: Pre-oxygenation with 100% oxygen Intubation Type: IV induction Ventilation: Mask ventilation without difficulty LMA: LMA inserted LMA Size: 5.0 Number of attempts: 1 Airway Equipment and Method: bite block Placement Confirmation: positive ETCO2 and breath sounds checked- equal and bilateral Tube secured with: Tape Dental Injury: Teeth and Oropharynx as per pre-operative assessment

## 2011-09-25 NOTE — Anesthesia Preprocedure Evaluation (Signed)
Anesthesia Evaluation  Patient identified by MRN, date of birth, ID band Patient awake    Reviewed: Allergy & Precautions, H&P , NPO status , Patient's Chart, lab work & pertinent test results  Airway Mallampati: I TM Distance: >3 FB Neck ROM: Full    Dental No notable dental hx. (+) Teeth Intact and Dental Advisory Given   Pulmonary neg pulmonary ROS,  breath sounds clear to auscultation  Pulmonary exam normal       Cardiovascular negative cardio ROS  Rhythm:Regular Rate:Normal     Neuro/Psych  Neuromuscular disease negative psych ROS   GI/Hepatic negative GI ROS, Neg liver ROS,   Endo/Other  negative endocrine ROS  Renal/GU negative Renal ROS  negative genitourinary   Musculoskeletal   Abdominal   Peds  Hematology negative hematology ROS (+)   Anesthesia Other Findings   Reproductive/Obstetrics negative OB ROS                           Anesthesia Physical Anesthesia Plan  ASA: I  Anesthesia Plan: General   Post-op Pain Management:    Induction: Intravenous  Airway Management Planned: LMA  Additional Equipment:   Intra-op Plan:   Post-operative Plan: Extubation in OR  Informed Consent: I have reviewed the patients History and Physical, chart, labs and discussed the procedure including the risks, benefits and alternatives for the proposed anesthesia with the patient or authorized representative who has indicated his/her understanding and acceptance.   Dental advisory given  Plan Discussed with: CRNA and Surgeon  Anesthesia Plan Comments:         Anesthesia Quick Evaluation

## 2011-09-27 ENCOUNTER — Encounter (HOSPITAL_BASED_OUTPATIENT_CLINIC_OR_DEPARTMENT_OTHER): Payer: Self-pay | Admitting: Surgery

## 2011-10-11 ENCOUNTER — Encounter (INDEPENDENT_AMBULATORY_CARE_PROVIDER_SITE_OTHER): Payer: Self-pay | Admitting: Surgery

## 2011-10-11 ENCOUNTER — Telehealth (INDEPENDENT_AMBULATORY_CARE_PROVIDER_SITE_OTHER): Payer: Self-pay | Admitting: General Surgery

## 2011-10-11 ENCOUNTER — Ambulatory Visit (INDEPENDENT_AMBULATORY_CARE_PROVIDER_SITE_OTHER): Payer: Medicare Other | Admitting: Surgery

## 2011-10-11 VITALS — BP 136/80 | HR 64 | Temp 97.3°F | Resp 16 | Ht 70.0 in | Wt 186.0 lb

## 2011-10-11 DIAGNOSIS — K429 Umbilical hernia without obstruction or gangrene: Secondary | ICD-10-CM

## 2011-10-11 NOTE — Telephone Encounter (Signed)
Pt's mother calling to ask if OK to drive a straight-drive vehicle.  Told her it is OK, but he should not lift heavy objects, as instructed.

## 2011-10-11 NOTE — Progress Notes (Signed)
NAME: George Fowler                                            DOB: [birth year] DATE: 10/11/2011                                                  MRN: [id number]  CC: Post op   HPI: This patient comes in for post op follow-up.Heunderwent Repair of an umbilical hernia on 09/25/11. He feels that he is doing well.  PE:  VITAL SIGNS: BP 136/80  Pulse 64  Temp 97.3 F (36.3 C) (Temporal)  Resp 16  Ht 5\' 10"  (1.778 m)  Wt 186 lb (84.369 kg)  BMI 26.69 kg/m2  General: The patient appears to be healthy, NAD The incision is healing nicely. The repair appears solid. There is no evidence of infection.  DATA REVIEWED: No new data  IMPRESSION: The patient is doing well S/P Umbilical hernia repair.    PLAN: I recommended that he not lift more than 15 pounds until he is 6 weeks postop. We will see him back here on a when necessary basis.

## 2011-10-11 NOTE — Patient Instructions (Signed)
Don't lift more than 15 pounds until you are six weeks from the surgery We will see you again on an as needed basis. Please call the office at [phone] if you have any questions or concerns. Thank you for allowing [place] to take care of you.

## 2011-11-25 ENCOUNTER — Ambulatory Visit (INDEPENDENT_AMBULATORY_CARE_PROVIDER_SITE_OTHER): Payer: Medicare Other | Admitting: Family Medicine

## 2011-11-25 ENCOUNTER — Encounter: Payer: Self-pay | Admitting: Family Medicine

## 2011-11-25 VITALS — BP 130/72 | HR 62 | Temp 97.7°F | Ht 70.0 in | Wt 188.8 lb

## 2011-11-25 DIAGNOSIS — B351 Tinea unguium: Secondary | ICD-10-CM | POA: Insufficient documentation

## 2011-11-25 DIAGNOSIS — M79672 Pain in left foot: Secondary | ICD-10-CM | POA: Insufficient documentation

## 2011-11-25 DIAGNOSIS — M79609 Pain in unspecified limb: Secondary | ICD-10-CM

## 2011-11-25 NOTE — Patient Instructions (Addendum)
I think you have a toenail fungal infection and also some athlete's foot Get some antifungal spray or powder or cream over the counter (like lotrimin) - and keep feet clean and dry  I think that toenail is growing out  Get appt with Dr Dareen Piano to discuss foot pain and orthotics

## 2011-11-25 NOTE — Progress Notes (Signed)
Subjective:    Patient ID: George Fowler, male    DOB: Dec 12, 1965, 46 y.o.   MRN: [id number]  HPI Here for L foot pain  Last week -- was out walking and his foot felt like it would "give way" Thinks that may be due to a remote injury (2003)  Also great toenail is cracked on the R-no recent trauma  This just started - was painful for a while not improved   He does have less strength in the L foot- with what he thinks is foot drop from previous injury Has been walking more lately  Thinks his orhotic may be worn out  Sees Dr Dareen Piano in [place] for ortho- and likely needs new orthotics  Patient Active Problem List  Diagnosis  . PERIPHERAL NEUROPATHY, FEET  . ELEVATED BLOOD PRESSURE WITHOUT DIAGNOSIS OF HYPERTENSION  . Diastasis recti   Past Medical History  Diagnosis Date  . Seasonal allergic rhinitis     with cough  . Foot injury     partially disabled from  . Neuropathy of leg     Chronic; bilateral--thinks it was from working on hard floors and favoring right foot  . No pertinent past medical history   . Umbilical hernia 11/14/2010    Repaired 09/25/11    Past Surgical History  Procedure Date  . Foot surgery 2003-lt    plate in big toe; pin in 2nd toe due to 2 ton beam falling on foot (2 surgeries on foot)  . Foot arthroplasty 2003    lt foot x3  . Eye surgery 2005    rt   . Umbilical hernia repair 09/25/2011    Procedure: HERNIA REPAIR UMBILICAL ADULT;  Surgeon: Currie Paris, MD;  Location: [facility];  Service: General;  Laterality: N/A;  umbilical hernia repair    History  Substance Use Topics  . Smoking status: Former Smoker    Quit date: 03/18/1990  . Smokeless tobacco: Never Used   Comment: quit after smoking x 3 years---in his 20's  . Alcohol Use: 0.0 oz/week    1-2 Cans of beer per week     1-2 per night   Family History  Problem Relation Age of Onset  . Hypertension Father   . Hypertension Mother   . Diabetes Mother   .  Heart disease Paternal Grandmother   . Heart disease Paternal Grandfather   . Cancer      uncle---unsure of what kind   Allergies  Allergen Reactions  . Penicillins     REACTION: rash   Current Outpatient Prescriptions on File Prior to Visit  Medication Sig Dispense Refill  . Calcium Carbonate-Vitamin D (CALCIUM 600+D) 600-400 MG-UNIT per tablet Take 1 tablet by mouth 2 (two) times daily.        . cholecalciferol (VITAMIN D) 400 UNITS TABS Take 400 Units by mouth daily.        Marland Kitchen gabapentin (NEURONTIN) 300 MG capsule Take 300-600 mg by mouth 3 (three) times daily as needed.       . vitamin B-12 (CYANOCOBALAMIN) 500 MCG tablet Take 500 mcg by mouth 2 (two) times daily.        . vitamin E 400 UNIT capsule Take 400 Units by mouth daily.             Review of Systems Review of Systems  Constitutional: Negative for fever, appetite change, fatigue and unexpected weight change.  Eyes: Negative for pain and visual disturbance.  Respiratory: Negative  for cough and shortness of breath.   Cardiovascular: Negative for cp or palpitations    Gastrointestinal: Negative for nausea, diarrhea and constipation.  Genitourinary: Negative for urgency and frequency.  Skin: Negative for pallor or rash  pos for cracked toe nail MSK pos for foot pain , neg for joint swelling  Neurological: Negative for weakness, light-headedness, numbness and headaches.  Hematological: Negative for adenopathy. Does not bruise/bleed easily.  Psychiatric/Behavioral: Negative for dysphoric mood. The patient is not nervous/anxious.         Objective:   Physical Exam  Constitutional: He appears well-developed and well-nourished. No distress.  HENT:  Head: Normocephalic and atraumatic.  Eyes: Conjunctivae normal and EOM are normal. Pupils are equal, round, and reactive to light.  Neck: Normal range of motion. Neck supple.  Musculoskeletal: He exhibits tenderness. He exhibits no edema.       Very high arches in  feet Surgical changes in L ankle and foot with tenderness in the arch and slt over the heel Well perfused  Fair sensation  Toe and foot dorsiflexion are not as sharp on the L - but no witnessed foot drop with gait (does favor the R foot)  Neurological: He is alert. He has normal reflexes. He exhibits normal muscle tone. Coordination normal.  Skin: Skin is warm and dry. No rash noted. No erythema.       Some maceration of skin between toes of both feet  R great nail is thickened with a horizontal split in middle - and base of nail appears normal and clear   Psychiatric: He has a normal mood and affect.          Assessment & Plan:

## 2011-11-25 NOTE — Assessment & Plan Note (Signed)
In arch  Pt has neuropathy and very high arch- with hx of injury in 2--3 and surgery  Will ref to his surgeon Dr Dareen Piano for re eval- to consider new orthotics ? If AFO would help for mild foot drop also

## 2011-11-25 NOTE — Assessment & Plan Note (Signed)
R great toe with signs of athlete's foot The toenail is 1/2 way grown out - proximal nail looks good Adv to tx feet with antifungal otc products and keep clean and dry- update if no imp

## 2012-05-27 ENCOUNTER — Encounter: Payer: Self-pay | Admitting: Family Medicine

## 2012-05-27 ENCOUNTER — Ambulatory Visit (INDEPENDENT_AMBULATORY_CARE_PROVIDER_SITE_OTHER): Payer: Medicare Other | Admitting: Family Medicine

## 2012-05-27 VITALS — BP 138/88 | HR 72 | Temp 98.7°F | Ht 68.0 in | Wt 197.8 lb

## 2012-05-27 DIAGNOSIS — Z0000 Encounter for general adult medical examination without abnormal findings: Secondary | ICD-10-CM

## 2012-05-27 DIAGNOSIS — Z23 Encounter for immunization: Secondary | ICD-10-CM

## 2012-05-27 LAB — POCT URINALYSIS DIPSTICK
Bilirubin, UA: NEGATIVE
Glucose, UA: NEGATIVE
Nitrite, UA: NEGATIVE
Spec Grav, UA: <=1.005
Urobilinogen, UA: 0.2

## 2012-05-27 NOTE — Patient Instructions (Addendum)
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

## 2012-05-27 NOTE — Assessment & Plan Note (Signed)
Reviewed health habits including diet and exercise and skin cancer prevention Also reviewed health mt list, fam hx and immunizations  Pt declined labs today- enc him to re consider in the future  No restriction for DOT PE Nl DRE Tdap today

## 2012-05-27 NOTE — Progress Notes (Signed)
Subjective:    Patient ID: George Fowler, male    DOB: 1965/09/12, 47 y.o.   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    [employer] is up 9 lb with bmi of 30 Less active in the winter time  Is exercising now - working out about 4 days per week at the gym Health habits- needs to eat better also - now eating grilled chicken and some green vegetables  Flu vaccine- did not get one  Td was 3/03- has not had one since then, will do it today   Declines labs today   Is apprehensive about prostate exam , but will do it  No nocturia    Mood-pretty good , not depressed   Vision ok without correction  Hearing ok  BP Readings from Last 3 Encounters:  05/27/12 138/88  11/25/11 130/72  10/11/11 136/80     fam hx -no cancer  Patient Active Problem List  Diagnosis  . PERIPHERAL NEUROPATHY, FEET  . ELEVATED BLOOD PRESSURE WITHOUT DIAGNOSIS OF HYPERTENSION  . Diastasis recti  . Foot pain, left  . Fungal toenail infection  . Routine general medical examination at a health care facility   Past Medical History  Diagnosis Date  . Seasonal allergic rhinitis     with cough  . Foot injury     partially disabled from  . Neuropathy of leg     Chronic; bilateral--thinks it was from working on hard floors and favoring right foot  . No pertinent past medical history   . Umbilical hernia 11/14/2010    Repaired 09/25/11    Past Surgical History  Procedure Laterality Date  . Foot surgery  2003-lt    plate in big toe; pin in 2nd toe due to 2 ton beam falling on foot (2 surgeries on foot)  . Foot arthroplasty  2003    lt foot x3  . Eye surgery  2005    rt   . Umbilical hernia repair  09/25/2011    Procedure: HERNIA REPAIR UMBILICAL ADULT;  Surgeon: Currie Paris, MD;  Location: [facility];  Service:  General;  Laterality: N/A;  umbilical hernia repair    History  Substance Use Topics  . Smoking status: Former Smoker    Quit date: 03/18/1990  . Smokeless tobacco: Never Used     Comment: quit after smoking x 3 years---in his 20's  . Alcohol Use: 0.0 oz/week    1-2 Cans of beer per week     Comment: 1-2 per night   Family History  Problem Relation Age of Onset  . Hypertension Father   . Hypertension Mother   . Diabetes Mother   . Heart disease Paternal Grandmother   . Heart disease Paternal Grandfather   . Cancer      uncle---unsure of what kind   Allergies  Allergen Reactions  . Penicillins     REACTION: rash   Current Outpatient Prescriptions on File Prior to Visit  Medication Sig Dispense Refill  . Calcium Carbonate-Vitamin D (CALCIUM 600+D) 600-400 MG-UNIT per tablet Take 1 tablet by mouth daily.       . cholecalciferol (VITAMIN D) 400 UNITS TABS Take 400 Units by mouth daily.        Marland Kitchen gabapentin (NEURONTIN) 300  MG capsule Take 300-600 mg by mouth 3 (three) times daily as needed.       . vitamin B-12 (CYANOCOBALAMIN) 500 MCG tablet Take 500 mcg by mouth 2 (two) times daily.        . vitamin E 400 UNIT capsule Take 400 Units by mouth daily.         No current facility-administered medications on file prior to visit.    Review of Systems Review of Systems  Constitutional: Negative for fever, appetite change, fatigue and unexpected weight change.  Eyes: Negative for pain and visual disturbance.  Respiratory: Negative for cough and shortness of breath.   Cardiovascular: Negative for cp or palpitations    Gastrointestinal: Negative for nausea, diarrhea and constipation.  Genitourinary: Negative for urgency and frequency.  Skin: Negative for pallor or rash   Neurological: Negative for weakness, light-headedness, numbness and headaches.  Hematological: Negative for adenopathy. Does not bruise/bleed easily.  Psychiatric/Behavioral: Negative for dysphoric mood. The  patient is not nervous/anxious.         Objective:   Physical Exam  Constitutional: He appears well-developed and well-nourished. No distress.  HENT:  Head: Normocephalic and atraumatic.  Right Ear: External ear normal.  Left Ear: External ear normal.  Nose: Nose normal.  Mouth/Throat: Oropharynx is clear and moist.  Eyes: Conjunctivae and EOM are normal. Pupils are equal, round, and reactive to light. No scleral icterus.  Neck: Normal range of motion. Neck supple. No JVD present. Carotid bruit is not present. No thyromegaly present.  Cardiovascular: Normal rate, regular rhythm, normal heart sounds and intact distal pulses.  Exam reveals no gallop.   Pulmonary/Chest: Effort normal and breath sounds normal. No respiratory distress. He has no wheezes. He exhibits no tenderness.  Abdominal: Soft. Bowel sounds are normal. He exhibits no distension, no abdominal bruit and no mass. There is no tenderness.  Genitourinary: Prostate normal. Prostate is not enlarged and not tender.  Musculoskeletal: He exhibits no edema and no tenderness.  Lymphadenopathy:    He has no cervical adenopathy.  Neurological: He is alert. He has normal reflexes. No cranial nerve deficit. He exhibits normal muscle tone. Coordination normal.  Skin: Skin is warm and dry. No rash noted. No erythema. No pallor.  Psychiatric: He has a normal mood and affect.          Assessment & Plan:

## 2012-11-20 ENCOUNTER — Encounter: Payer: Self-pay | Admitting: Family Medicine

## 2012-11-20 ENCOUNTER — Ambulatory Visit (INDEPENDENT_AMBULATORY_CARE_PROVIDER_SITE_OTHER): Payer: Medicare Other | Admitting: Family Medicine

## 2012-11-20 VITALS — BP 144/88 | HR 82 | Temp 98.7°F | Ht 68.0 in | Wt 184.5 lb

## 2012-11-20 DIAGNOSIS — L259 Unspecified contact dermatitis, unspecified cause: Secondary | ICD-10-CM

## 2012-11-20 DIAGNOSIS — L309 Dermatitis, unspecified: Secondary | ICD-10-CM | POA: Insufficient documentation

## 2012-11-20 DIAGNOSIS — L255 Unspecified contact dermatitis due to plants, except food: Secondary | ICD-10-CM

## 2012-11-20 DIAGNOSIS — S90221A Contusion of right lesser toe(s) with damage to nail, initial encounter: Secondary | ICD-10-CM

## 2012-11-20 DIAGNOSIS — L237 Allergic contact dermatitis due to plants, except food: Secondary | ICD-10-CM

## 2012-11-20 DIAGNOSIS — S90229A Contusion of unspecified lesser toe(s) with damage to nail, initial encounter: Secondary | ICD-10-CM | POA: Insufficient documentation

## 2012-11-20 DIAGNOSIS — S90129A Contusion of unspecified lesser toe(s) without damage to nail, initial encounter: Secondary | ICD-10-CM

## 2012-11-20 MED ORDER — MOMETASONE FUROATE 0.1 % EX CREA: TOPICAL_CREAM | Freq: Every day | CUTANEOUS | Status: DC

## 2012-11-20 NOTE — Progress Notes (Signed)
Subjective:    Patient ID: George Fowler, male    DOB: March 14, 1966, 47 y.o.   MRN: [id number]  HPI Here with a rough spot on his hand  Every winter top of L hand gets red and scaley and rough  Little bit of itching He uses otc "glove in a bottle" No joint swelling  No other areas   ? Due to weather  His hand does sit near window for driving   He avoids using too much detergent for clothing  Uses dove soap   A few bumps on other hand also -new  ? 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 Injury Has been there 2 weeks   Patient Active Problem List   Diagnosis Date Noted  . Eczema 11/20/2012  . 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 Routine general medical examination at a health care facility 05/27/2012  . Foot pain, left 11/25/2011  . Fungal toenail infection 11/25/2011  . Diastasis recti 08/23/2011  . 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 ELEVATED BLOOD PRESSURE WITHOUT DIAGNOSIS OF HYPERTENSION 02/22/2010   Past Medical History  Diagnosis Date  . Seasonal allergic rhinitis     with cough  . Foot injury     partially disabled from  . Neuropathy of leg     Chronic; bilateral--thinks it was from working on hard floors and favoring right foot  . No pertinent past medical history   . Umbilical hernia 11/14/2010    Repaired 09/25/11    Past Surgical History  Procedure Laterality Date  . Foot surgery  2003-lt    plate in big toe; pin in 2nd toe due to 2 ton beam falling on foot (2 surgeries on foot)  . Foot arthroplasty  2003    lt foot x3  . Eye surgery  2005    rt   . Umbilical hernia repair  09/25/2011    Procedure: HERNIA REPAIR UMBILICAL ADULT;  Surgeon: Currie Paris, MD;  Location: [facility];  Service: General;  Laterality: N/A;  umbilical hernia repair    History  Substance Use Topics  . Smoking status: Former Smoker    Quit date: 03/18/1990  .  Smokeless tobacco: Never Used     Comment: quit after smoking x 3 years---in his 20's  . Alcohol Use: 0.0 oz/week    1-2 Cans of beer per week     Comment: 1-2 per night   Family History  Problem Relation Age of Onset  . Hypertension Father   . Hypertension Mother   . Diabetes Mother   . Heart disease Paternal Grandmother   . Heart disease Paternal Grandfather   . Cancer      uncle---unsure of what kind   Allergies  Allergen Reactions  . Penicillins     REACTION: rash   Current Outpatient Prescriptions on File Prior to Visit  Medication Sig Dispense Refill  . Calcium Carbonate-Vitamin D (CALCIUM 600+D) 600-400 MG-UNIT per tablet Take 1 tablet by mouth daily.       . cholecalciferol (VITAMIN D) 400 UNITS TABS Take 400 Units by mouth daily.        Marland Kitchen gabapentin (NEURONTIN) 300 MG capsule Take 300-600 mg by mouth 3 (three) times daily as needed.       . vitamin B-12 (CYANOCOBALAMIN) 500 MCG tablet Take 500 mcg by mouth 2 (two) times daily.        Marland Kitchen  vitamin E 400 UNIT capsule Take 400 Units by mouth daily.         No current facility-administered medications on file prior to visit.    Review of Systems    Review of Systems  Constitutional: Negative for fever, appetite change, fatigue and unexpected weight change.  Eyes: Negative for pain and visual disturbance.  Respiratory: Negative for cough and shortness of breath.   Cardiovascular: Negative for cp or palpitations    Gastrointestinal: Negative for nausea, diarrhea and constipation.  Genitourinary: Negative for urgency and frequency.  Skin: Negative for pallor and pos for rash   Neurological: Negative for weakness, light-headedness, numbness and headaches.  Hematological: Negative for adenopathy. Does not bruise/bleed easily.  Psychiatric/Behavioral: Negative for dysphoric mood. The patient is not nervous/anxious.      Objective:   Physical Exam  Constitutional: He appears well-developed and well-nourished. No distress.   HENT:  Head: Normocephalic and atraumatic.  Eyes: Conjunctivae and EOM are normal. Pupils are equal, round, and reactive to light.  Neck: Normal range of motion. Neck supple.  Musculoskeletal: He exhibits no edema.  Lymphadenopathy:    He has no cervical adenopathy.  Neurological: He is alert. He has normal reflexes.  Skin: Skin is warm and dry. Rash noted. There is erythema.  R hand - several areas of vesicles -erythematous, crusting over- consistent with plant dermatitis  L hand- some mild scale and erythema (dry without cracking) over dorsal hand medially and no nail changes (consistent with eczema)  L foot- some bruising under 2nd nail/ nt     Psychiatric: He has a normal mood and affect.          Assessment & Plan:

## 2012-11-20 NOTE — Patient Instructions (Signed)
If the area on your left 2nd toenail does not grow out in a month- call so I can refer you to dermatology Use the elocon cream on the poison ivy on right hand and also the eczema on right hand when needed Use dove soap Use a "free" clothing detergent  Avoid fragrances also  Avoid very hot water  Use moisturizer without color or fragrance (like eucerin or lubriderm)

## 2012-11-22 NOTE — Assessment & Plan Note (Signed)
Initial encounter- will watch to make sure this grows out  If not - ref to derm for poss nevus

## 2012-11-22 NOTE — Assessment & Plan Note (Signed)
On L hand -initial encounter but has been intermittent for pt  tx with elocon cream prn  Disc avoiding detergents/ hot water and using moisturizers  Will update it not improved  See AVS

## 2012-11-22 NOTE — Assessment & Plan Note (Signed)
Initial encounter tx with elocon for mild dermatitis on R hand  Update if not starting to improve in a week or if worsening  Disc avoidance and cleaning clothing that may have touched the plant

## 2013-03-19 ENCOUNTER — Telehealth: Payer: Self-pay | Admitting: Family Medicine

## 2013-03-19 NOTE — Telephone Encounter (Signed)
See prev note I will call the phone # to authorize his renewal if it is okay with you, please advise

## 2013-03-19 NOTE — Telephone Encounter (Signed)
Pt walked in and needs renewal to [employer].  Stated that there is not a form any longer that "Dr. Melynda Kellerr CMA need to call number on back of [employer] card [phone] to authorize."  Best number to call patient if any questions [phone]

## 2013-03-21 NOTE — Telephone Encounter (Signed)
Please authorize that, thanks

## 2013-03-25 NOTE — Telephone Encounter (Signed)
Called phone # and was on hold for and then automated sys said no one available to take call and to leave message, left message requesting rep to call me back

## 2013-04-01 NOTE — Telephone Encounter (Signed)
Spoke with [employer] rep and they said that the pt has to call his insurance company and they have to authorize it. Pt notified and he is in the process of getting it approved through his insurance now. Pt said they may fax over a form for Dr. Milinda Antisower to fill out but he isn't sure just yet but he will let us know but as of right now we don't need to do anything else.  Copy of his [employer] card sent for scanning so we can have it in his chart

## 2013-04-19 ENCOUNTER — Telehealth: Payer: Self-pay | Admitting: Family Medicine

## 2013-04-19 NOTE — Telephone Encounter (Signed)
Patient Information:  Caller Name: Casimiro NeedleMichael  Phone: [phone]  Patient: Benancio DeedsMurray, Jacub D  Gender: Male  DOB: [birth year]  Age: 48 Years  PCP: Roxy Mannsower, [facility] Practice)  Office Follow Up:  Does the office need to follow up with this patient?: No  Instructions For The Office: N/A  RN Note:  Afebrile. Girlfriend was diagnosed with the flu and she kissed him Friday, 04/17/2013. He has no symptoms and wondering if he needed Tamiflu. RN/CAN increased information regarding Flu/Tamiflu and did not advise him taking the medication after reviewing his chart. He will call back if he gets any symptoms.  Symptoms  Reason For Call & Symptoms: His girlfriend tested positive for the flu.  Reviewed Health History In EMR: Yes  Reviewed Medications In EMR: Yes  Reviewed Allergies In EMR: Yes  Reviewed Surgeries / Procedures: Yes  Date of Onset of Symptoms: 04/19/2013  Guideline(s) Used:  Influenza Exposure  Disposition Per Guideline:   Home Care  Reason For Disposition Reached:   Influenza EXPOSURE (Close Contact) within last 7 days and NO respiratory symptoms  Advice Given:  Reassurance - Influenza Exposure More Than 7 Days Ago:   Although you were exposed to flu, you do not have any symptoms.  There are some things that you can do to help prevent getting flu.  Here is some care advice that should help.  Reassurance - Influenza Exposure Within Past 7 Days  Although you were exposed to flu (influenza), you do not have any symptoms.  Influenza - Key Points:  Influenza is a respiratory illness that is easily spread from person-to-person.  The most common symptoms are the sudden onset of fever, muscle aches, cough, runny nose, sore throat, fatigue and headache.  The best way to prevent flu is to get the vaccine every year.  Influenza - Symptoms:  Symptoms of flu are similar to a common cold: runny nose, sore throat, and a bad cough.  Call Back If  You have more questions.  Influenza -  Treatment With Antiviral Drugs:  There are two antiviral medications that are helpful in treating this infection: oseltamivir (brand name Tamiflu) and zanamivir (brand name Relenza).  Treatment is recommended for High Risk patients (e.g., age over 47 years, pregnant, HIV+, or chronic medical condition) with influenza or any patient with severe symptoms (per CDC).  Treatment is usually not needed for most healthy people who have mild or moderate flu symptoms.. Most patients recover without taking antiviral medications.  The benefits are limited. Antiviral medications may reduce the time you are sick by 1 to 1.5 days. It helps reduce the symptoms, but does not cure the disease.  For best results, antiviral medications should be started within 48 hours of the onset of flu symptoms.  How To Protect Yourself From Getting Sick:  Wash hands often with soap and water.  Alcohol-based hand cleaners are also effective.  Avoid touching the eyes, nose or mouth. Germs on the hands can spread this way.  Do not share eating utensils (e.g., spoon, fork).  Try to avoid close contact with sick people.  Try to avoid unnecessary visits to the emergency department and urgent care centers. You are more likely to be exposed to influenza in these places if you don't have it.  How To Protect Others - Stay Home When Sick:  Cover the nose and mouth with a tissue when coughing or sneezing.  Wash hands often with soap and water, especially after coughing or sneezing. Alcohol-based hand cleaners  are also effective.  Limit contact with others to keep from infecting them.  Stay home from school or work for at least 24 hours after the fever is gone.  Patient Will Follow Care Advice:  YES

## 2013-04-19 NOTE — Telephone Encounter (Signed)
I do not think he needs to take it based on the fact he is healthy and not immunocompromised Do watch out for any flu symptoms and keep us posted

## 2013-04-20 NOTE — Telephone Encounter (Signed)
Pt notified of Dr. Tower's comments and verbalized understanding  

## 2013-04-20 NOTE — Telephone Encounter (Signed)
I have never been able to get ins to cover a gym - since it is not a pharmacologic product or drug - I do not think it will help --however if he checks with his insurance and they say a letter will help- I will happily write it

## 2013-04-20 NOTE — Telephone Encounter (Signed)
Pt advise me that his insurance no longer covers the silver sneakers program. Pt wanted to know if there is anything we can do to help. Pt is requesting that you write a letter saying that due to his foot injury 2846yrs ago and the neuropathy/pain from that injury that he needs to go to the gym and keep exercising that foot. I advise pt that Dr. Milinda Antisower may write the letter but he needs to call his insurance to see if that letter will help get his gym cost covered and/or call a few local gyms to see if they would offer a discount or help if we write the letter

## 2013-04-20 NOTE — Telephone Encounter (Signed)
Mr. Dayton ScrapeMurray notified as instructed by telephone.  He will check with his insurance to find out if a letter will help get coverage for a gym membership and he will call us back if he indeed needs a letter from Dr. Milinda Antisower.

## 2013-05-28 ENCOUNTER — Encounter: Payer: Medicare Other | Admitting: Family Medicine

## 2013-06-01 ENCOUNTER — Encounter: Payer: Medicare Other | Admitting: Family Medicine

## 2013-07-14 ENCOUNTER — Encounter: Payer: Medicare Other | Admitting: Family Medicine

## 2013-11-23 ENCOUNTER — Encounter: Payer: Self-pay | Admitting: Family Medicine

## 2013-11-23 ENCOUNTER — Ambulatory Visit (INDEPENDENT_AMBULATORY_CARE_PROVIDER_SITE_OTHER): Payer: Medicare Other | Admitting: Family Medicine

## 2013-11-23 ENCOUNTER — Ambulatory Visit: Payer: Medicare Other | Admitting: Family Medicine

## 2013-11-23 VITALS — BP 120/82 | HR 80 | Temp 98.6°F | Ht 68.0 in | Wt 182.5 lb

## 2013-11-23 DIAGNOSIS — L259 Unspecified contact dermatitis, unspecified cause: Secondary | ICD-10-CM

## 2013-11-23 DIAGNOSIS — T63441A Toxic effect of venom of bees, accidental (unintentional), initial encounter: Secondary | ICD-10-CM

## 2013-11-23 DIAGNOSIS — T63461A Toxic effect of venom of wasps, accidental (unintentional), initial encounter: Secondary | ICD-10-CM

## 2013-11-23 DIAGNOSIS — T6391XA Toxic effect of contact with unspecified venomous animal, accidental (unintentional), initial encounter: Secondary | ICD-10-CM

## 2013-11-23 DIAGNOSIS — L309 Dermatitis, unspecified: Secondary | ICD-10-CM

## 2013-11-23 MED ORDER — MUPIROCIN 2 % EX OINT: 1.0000 "application " | TOPICAL_OINTMENT | Freq: Every day | CUTANEOUS | Status: DC

## 2013-11-23 MED ORDER — MOMETASONE FUROATE 0.1 % EX CREA: TOPICAL_CREAM | Freq: Every day | CUTANEOUS | Status: DC

## 2013-11-23 NOTE — Assessment & Plan Note (Signed)
Pt has had multiple stings in the past month-in different stages of healing on both hands  Area on proximal L index finger is worst -disc risk of infx Also has sting on L thumb and R dorsal hand and lateral hand Disc hygeine Px bactroban oint to use once daily and then elocon cream to use 12 hours later If worse or fever/other infx symptoms -inst to call

## 2013-11-23 NOTE — Progress Notes (Signed)
Pre visit review using our clinic review tool, if applicable. No additional management support is needed unless otherwise documented below in the visit note. 

## 2013-11-23 NOTE — Progress Notes (Signed)
Subjective:    Patient ID: George Fowler, male    DOB: 13-Dec-1965, 48 y.o.   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 Bee sting reaction 11/23/2013  . Eczema 11/20/2012  . 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 Toenail bruise 11/20/2012  . Routine general medical examination at a health care facility 05/27/2012  . Foot pain, left 11/25/2011  . Fungal toenail infection 11/25/2011  . Diastasis recti 08/23/2011  . 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 ELEVATED BLOOD PRESSURE WITHOUT DIAGNOSIS OF HYPERTENSION 02/22/2010   Past Medical History  Diagnosis Date  . Seasonal allergic rhinitis     with cough  . Foot injury     partially disabled from  . Neuropathy of leg     Chronic; bilateral--thinks it was from working on hard floors and favoring right foot  . No pertinent past medical history   . Umbilical hernia 11/14/2010    Repaired 09/25/11    Past Surgical History  Procedure Laterality Date  . Foot surgery  2003-lt    plate in big toe; pin in 2nd toe due to 2 ton beam falling on foot (2 surgeries on foot)  . Foot arthroplasty  2003    lt foot x3  . Eye surgery  2005    rt   . Umbilical hernia repair  09/25/2011    Procedure: HERNIA REPAIR UMBILICAL ADULT;  Surgeon: Currie Paris, MD;   Location: [facility];  Service: General;  Laterality: N/A;  umbilical hernia repair    History  Substance Use Topics  . Smoking status: Former Smoker    Quit date: 03/18/1990  . Smokeless tobacco: Never Used     Comment: quit after smoking x 3 years---in his 20's  . Alcohol Use: 0.0 oz/week    1-2 Cans of beer per week     Comment: 1-2 per night   Family History  Problem Relation Age of Onset  . Hypertension Father   . Hypertension Mother   . Diabetes Mother   . Heart disease Paternal Grandmother   . Heart disease Paternal Grandfather   . Cancer      uncle---unsure of what kind   Allergies  Allergen Reactions  . Penicillins     REACTION: rash   Current Outpatient Prescriptions on File Prior to Visit  Medication Sig Dispense Refill  . Calcium Carbonate-Vitamin D (CALCIUM 600+D) 600-400 MG-UNIT per tablet Take 1 tablet by mouth daily.       . cholecalciferol (VITAMIN D) 400 UNITS TABS Take 400 Units by mouth daily.        Marland Kitchen gabapentin (NEURONTIN) 300  MG capsule Take 300-600 mg by mouth 3 (three) times daily as needed.       . vitamin B-12 (CYANOCOBALAMIN) 500 MCG tablet Take 500 mcg by mouth 2 (two) times daily.        . vitamin E 400 UNIT capsule Take 400 Units by mouth daily.         No current facility-administered medications on file prior to visit.    Review of Systems    Review of Systems  Constitutional: Negative for fever, appetite change, fatigue and unexpected weight change.  Eyes: Negative for pain and visual disturbance.  Respiratory: Negative for cough and shortness of breath.   Cardiovascular: Negative for cp or palpitations    Gastrointestinal: Negative for nausea, diarrhea and constipation.  Genitourinary: Negative for urgency and frequency.  Skin: Negative for pallor or rash  pos for insect stings on hands  Neurological: Negative for weakness, light-headedness, numbness and headaches.  Hematological: Negative for adenopathy. Does not  bruise/bleed easily.  Psychiatric/Behavioral: Negative for dysphoric mood. The patient is not nervous/anxious.    Objective:   Physical Exam  Constitutional: He appears well-developed and well-nourished.  HENT:  Head: Normocephalic and atraumatic.  Mouth/Throat: Oropharynx is clear and moist.  Eyes: Conjunctivae and EOM are normal. Pupils are equal, round, and reactive to light.  Neck: Normal range of motion. Neck supple.  Cardiovascular: Normal rate, regular rhythm and normal heart sounds.   Pulmonary/Chest: Effort normal and breath sounds normal. He has no wheezes.  Lymphadenopathy:    He has no cervical adenopathy.  Neurological: He is alert.  Skin: Skin is warm and dry. There is erythema. No pallor.  Sites of insect stings L index finger and thumb R dorsal hand and lateral hand   No stingers present  Scabs present with some peeling Erythema and slt swelling w/o discharge on L index finger  No eczema noted today  Psychiatric: He has a normal mood and affect.          Assessment & Plan:   Problem List Items Addressed This Visit     Musculoskeletal and Integument   Eczema - Primary     This has not acted up much yet this season -worse in fall /winter on hands  Enc to keep using dove soap and avoid hot water Refilled elocon cream       Other   Bee sting reaction     Pt has had multiple stings in the past month-in different stages of healing on both hands  Area on proximal L index finger is worst -disc risk of infx Also has sting on L thumb and R dorsal hand and lateral hand Disc hygeine Px bactroban oint to use once daily and then elocon cream to use 12 hours later If worse or fever/other infx symptoms -inst to call

## 2013-11-23 NOTE — Assessment & Plan Note (Signed)
This has not acted up much yet this season -worse in fall /winter on hands  Enc to keep using dove soap and avoid hot water Refilled elocon cream

## 2013-11-23 NOTE — Patient Instructions (Signed)
Wash the wounds carefully with antibacterial soap and water  Use bactroban ointment (this is an antibacterial ointment) once daily  12 hours later use elocon cream (steroid cream)  Update if not starting to improve in a week or if worsening

## 2014-02-07 ENCOUNTER — Other Ambulatory Visit: Payer: Self-pay | Admitting: Family Medicine

## 2014-02-07 NOTE — Telephone Encounter (Signed)
Electronic refill request, please advise  

## 2014-02-07 NOTE — Telephone Encounter (Signed)
Please refill times one  

## 2014-02-28 ENCOUNTER — Ambulatory Visit: Payer: Medicare Other | Admitting: Internal Medicine

## 2014-02-28 ENCOUNTER — Encounter: Payer: Self-pay | Admitting: Family Medicine

## 2014-02-28 ENCOUNTER — Ambulatory Visit (INDEPENDENT_AMBULATORY_CARE_PROVIDER_SITE_OTHER): Payer: Medicare Other | Admitting: Family Medicine

## 2014-02-28 VITALS — BP 138/88 | HR 72 | Temp 97.8°F | Ht 68.0 in | Wt 185.0 lb

## 2014-02-28 DIAGNOSIS — J309 Allergic rhinitis, unspecified: Secondary | ICD-10-CM | POA: Insufficient documentation

## 2014-02-28 MED ORDER — FLUTICASONE PROPIONATE 50 MCG/ACT NA SUSP: 2.0000 | Freq: Every day | NASAL | Status: DC

## 2014-02-28 NOTE — Progress Notes (Signed)
Subjective:    Patient ID: George Fowler, male    DOB: [birth year], 48 y.o.   MRN: [id number]  HPI Here with sinus drainage (about 2 mo)  Coughing -interrupts him when trying to talk -- improved over the weekend  Dry cough  Was blowing nose - originally clear mucous - now cannot get anything out  No sinus pain  No fever   Thinks this may be allergy related  He has moved-different exposures (has a basement with moisture)  Tried mucinex and then regular "allergy med" -- [facility] "allergy med"- generic - did not say what it was  No nasal sprays     Patient Active Problem List   Diagnosis Date Noted  . Bee sting reaction 11/23/2013  . Eczema 11/20/2012  . 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 Toenail bruise 11/20/2012  . Routine general medical examination at a health care facility 05/27/2012  . Foot pain, left 11/25/2011  . Fungal toenail infection 11/25/2011  . Diastasis recti 08/23/2011  . 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 ELEVATED BLOOD PRESSURE WITHOUT DIAGNOSIS OF HYPERTENSION 02/22/2010   Past Medical History  Diagnosis Date  . Seasonal allergic rhinitis     with cough  . Foot injury     partially disabled from  . Neuropathy of leg     Chronic; bilateral--thinks it was from working on hard floors and favoring right foot  . No pertinent past medical history   . Umbilical hernia 11/14/2010    Repaired 09/25/11    Past Surgical History  Procedure Laterality Date  . Foot surgery  2003-lt    plate in big toe; pin in 2nd toe due to 2 ton beam falling on foot (2 surgeries on foot)  . Foot arthroplasty  2003    lt foot x3  . Eye surgery  2005    rt   . Umbilical hernia repair  09/25/2011    Procedure: HERNIA REPAIR UMBILICAL ADULT;  Surgeon: Currie Parishristian J Streck, MD;  Location: [facility];  Service: General;  Laterality: N/A;  umbilical hernia repair    History  Substance Use Topics  . Smoking status: Former Smoker    Quit date:  03/18/1990  . Smokeless tobacco: Never Used     Comment: quit after smoking x 3 years---in his 20's  . Alcohol Use: 0.0 oz/week    1-2 Cans of beer per week     Comment: 1-2 per night   Family History  Problem Relation Age of Onset  . Hypertension Father   . Hypertension Mother   . Diabetes Mother   . Heart disease Paternal Grandmother   . Heart disease Paternal Grandfather   . Cancer      uncle---unsure of what kind   Allergies  Allergen Reactions  . Penicillins     REACTION: rash   Current Outpatient Prescriptions on File Prior to Visit  Medication Sig Dispense Refill  . Calcium Carbonate-Vitamin D (CALCIUM 600+D) 600-400 MG-UNIT per tablet Take 1 tablet by mouth daily.     . cholecalciferol (VITAMIN D) 400 UNITS TABS Take 400 Units by mouth daily.      Marland Kitchen. gabapentin (NEURONTIN) 300 MG capsule Take 300-600 mg by mouth 3 (three) times daily as needed.     . mometasone (ELOCON) 0.1 % cream APPLY  CREAM TOPICALLY TO AFFECTED AREA ONCE DAILY 15 g 0  . mupirocin ointment (BACTROBAN) 2 % Apply 1 application topically daily. 22 g 1  . vitamin B-12 (  CYANOCOBALAMIN) 500 MCG tablet Take 500 mcg by mouth 2 (two) times daily.      . vitamin E 400 UNIT capsule Take 400 Units by mouth daily.       No current facility-administered medications on file prior to visit.     Review of Systems Review of Systems  Constitutional: Negative for fever, appetite change, fatigue and unexpected weight change.  ENT pos for congestion /rhinorrhea and sneezing/ neg for sinus pain  Eyes: Negative for pain and visual disturbance.  Respiratory: Negative for wheeze and shortness of breath.   Cardiovascular: Negative for cp or palpitations    Gastrointestinal: Negative for nausea, diarrhea and constipation.  Genitourinary: Negative for urgency and frequency.  Skin: Negative for pallor or rash   Neurological: Negative for weakness, light-headedness, numbness and headaches.  Hematological: Negative for  adenopathy. Does not bruise/bleed easily.  Psychiatric/Behavioral: Negative for dysphoric mood. The patient is not nervous/anxious.         Objective:   Physical Exam  Constitutional: He appears well-developed and well-nourished. No distress.  HENT:  Head: Normocephalic and atraumatic.  Right Ear: External ear normal.  Left Ear: External ear normal.  Mouth/Throat: Oropharynx is clear and moist. No oropharyngeal exudate.  Nares are pale and boggy No sinus tenderness Throat clear Clear rhinorrhea   Eyes: Conjunctivae and EOM are normal. Right eye exhibits no discharge. Left eye exhibits no discharge.  Neck: Normal range of motion. Neck supple.  Cardiovascular: Normal rate and regular rhythm.   Pulmonary/Chest: Effort normal and breath sounds normal. No respiratory distress. He has no wheezes. He has no rales.  Lymphadenopathy:    He has no cervical adenopathy.  Neurological: He is alert.  Skin: Skin is warm and dry. No rash noted. No erythema.  Psychiatric: He has a normal mood and affect.          Assessment & Plan:   Problem List Items Addressed This Visit      Respiratory   Allergic rhinitis - Primary    With post nasal drip/ cough for 2 mo  ? Due to mold in basement Trial of otc zyrtec and also flonase   Update if not starting to improve in a week or if worsening

## 2014-02-28 NOTE — Progress Notes (Signed)
Pre visit review using our clinic review tool, if applicable. No additional management support is needed unless otherwise documented below in the visit note. 

## 2014-02-28 NOTE — Patient Instructions (Signed)
Get over the counter zyrtec (store brand of zyrtec is fine) and take 10 mg once daily  Also use flonase nasal spray (sent to pharmacy) once daily as directed  If not improved in 1-2 weeks let me know  If worse or sinus pain or fever or worse cough- let me know  Try to get the mold cleaned out of basement if that is the problem

## 2014-02-28 NOTE — Assessment & Plan Note (Signed)
With post nasal drip/ cough for 2 mo  ? Due to mold in basement Trial of otc zyrtec and also flonase   Update if not starting to improve in a week or if worsening

## 2014-03-15 ENCOUNTER — Other Ambulatory Visit: Payer: Self-pay | Admitting: Family Medicine

## 2014-03-22 ENCOUNTER — Encounter: Payer: Medicare Other | Admitting: Family Medicine

## 2014-05-25 ENCOUNTER — Encounter: Payer: Self-pay | Admitting: Family Medicine

## 2014-05-25 ENCOUNTER — Ambulatory Visit (INDEPENDENT_AMBULATORY_CARE_PROVIDER_SITE_OTHER): Payer: Medicare Other | Admitting: Family Medicine

## 2014-05-25 VITALS — BP 138/82 | HR 78 | Temp 98.3°F | Ht 68.0 in | Wt 193.5 lb

## 2014-05-25 DIAGNOSIS — J069 Acute upper respiratory infection, unspecified: Secondary | ICD-10-CM

## 2014-05-25 DIAGNOSIS — B9789 Other viral agents as the cause of diseases classified elsewhere: Principal | ICD-10-CM

## 2014-05-25 MED ORDER — ALBUTEROL SULFATE HFA 108 (90 BASE) MCG/ACT IN AERS: 2.0000 | INHALATION_SPRAY | RESPIRATORY_TRACT | Status: DC | PRN

## 2014-05-25 NOTE — Progress Notes (Signed)
Pre visit review using our clinic review tool, if applicable. No additional management support is needed unless otherwise documented below in the visit note. 

## 2014-05-25 NOTE — Progress Notes (Signed)
Subjective:    Patient ID: George Fowler, male    DOB: Jun 11, 1965, 49 y.o.   MRN: [id number]  HPI Here for uri symptoms since last Friday -and symptoms are worsening  Started with a little cough and post nasal drip    Otc: "severe multi symptom allergy"- like tylenol sinus - generic (from [employer])-   Now nose if running and head feels stuffy and a cough from the chest   no facial pain  Low grade temp last night   Patient Active Problem List   Diagnosis Date Noted  . Allergic rhinitis 02/28/2014  . Bee sting reaction 11/23/2013  . Eczema 11/20/2012  . 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 Toenail bruise 11/20/2012  . Routine general medical examination at a health care facility 05/27/2012  . Foot pain, left 11/25/2011  . Fungal toenail infection 11/25/2011  . Diastasis recti 08/23/2011  . 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 ELEVATED BLOOD PRESSURE WITHOUT DIAGNOSIS OF HYPERTENSION 02/22/2010   Past Medical History  Diagnosis Date  . Seasonal allergic rhinitis     with cough  . Foot injury     partially disabled from  . Neuropathy of leg     Chronic; bilateral--thinks it was from working on hard floors and favoring right foot  . No pertinent past medical history   . Umbilical hernia 11/14/2010    Repaired 09/25/11    Past Surgical History  Procedure Laterality Date  . Foot surgery  2003-lt    plate in big toe; pin in 2nd toe due to 2 ton beam falling on foot (2 surgeries on foot)  . Foot arthroplasty  2003    lt foot x3  . Eye surgery  2005    rt   . Umbilical hernia repair  09/25/2011    Procedure: HERNIA REPAIR UMBILICAL ADULT;  Surgeon: Currie Paris, MD;  Location: [facility];  Service: General;  Laterality: N/A;  umbilical hernia repair    History  Substance Use Topics  . Smoking status: Former Smoker    Quit date: 03/18/1990  . Smokeless tobacco: Never Used     Comment: quit after smoking x 3 years---in his  20's  . Alcohol Use: 0.0 oz/week    1-2 Cans of beer per week     Comment: 1-2 per night   Family History  Problem Relation Age of Onset  . Hypertension Father   . Hypertension Mother   . Diabetes Mother   . Heart disease Paternal Grandmother   . Heart disease Paternal Grandfather   . Cancer      uncle---unsure of what kind   Allergies  Allergen Reactions  . Penicillins     REACTION: rash   Current Outpatient Prescriptions on File Prior to Visit  Medication Sig Dispense Refill  . Calcium Carbonate-Vitamin D (CALCIUM 600+D) 600-400 MG-UNIT per tablet Take 1 tablet by mouth daily.     . cholecalciferol (VITAMIN D) 400 UNITS TABS Take 400 Units by mouth daily.      . fluticasone (FLONASE) 50 MCG/ACT nasal spray Place 2 sprays into both nostrils daily. 16 g 6  . gabapentin (NEURONTIN) 300 MG capsule Take 300-600 mg by mouth 3 (three) times daily as needed.     . mometasone (ELOCON) 0.1 % cream APPLY TO AFFECTED AREA ONCE DAILY 15 g 0  . mupirocin ointment (BACTROBAN) 2 % Apply 1 application topically daily. 22 g 1  . vitamin B-12 (CYANOCOBALAMIN) 500 MCG tablet  Take 500 mcg by mouth 2 (two) times daily.      . vitamin E 400 UNIT capsule Take 400 Units by mouth daily.       No current facility-administered medications on file prior to visit.        Review of Systems Review of Systems  Constitutional: Negative for fever, appetite change,  and unexpected weight change. pos for fatigue ENT pos for cong and rhinorrhea and st and drip Eyes: Negative for pain and visual disturbance.  Respiratory: Negative for shortnessof breath.  pos for cough and mild wheeze at night  Cardiovascular: Negative for cp or palpitations    Gastrointestinal: Negative for nausea, diarrhea and constipation.  Genitourinary: Negative for urgency and frequency.  Skin: Negative for pallor or rash   Neurological: Negative for weakness, light-headedness, numbness and headaches.  Hematological: Negative for  adenopathy. Does not bruise/bleed easily.  Psychiatric/Behavioral: Negative for dysphoric mood. The patient is not nervous/anxious.         Objective:   Physical Exam  Constitutional: He appears well-developed and well-nourished. No distress.  HENT:  Head: Normocephalic and atraumatic.  Right Ear: External ear normal.  Left Ear: External ear normal.  Mouth/Throat: Oropharynx is clear and moist. No oropharyngeal exudate.  Nares are injected and congested  No sinus tenderness TMs dull  Clear rhinorrhea   Eyes: Conjunctivae and EOM are normal. Pupils are equal, round, and reactive to light. Right eye exhibits no discharge. Left eye exhibits no discharge.  Neck: Normal range of motion. Neck supple.  Cardiovascular: Normal rate and regular rhythm.   Pulmonary/Chest: Effort normal and breath sounds normal. No respiratory distress. He has no wheezes. He has no rales. He exhibits no tenderness.  Lymphadenopathy:    He has no cervical adenopathy.  Neurological: He is alert.  Skin: Skin is warm and dry. No rash noted.  Psychiatric: He has a normal mood and affect.          Assessment & Plan:   Problem List Items Addressed This Visit      Respiratory   Viral URI with cough - Primary    Re assuring exam Given albuterol mdi px with inst for use for wheezing if needed Disc symptomatic care - see instructions on AVS  Update if not starting to improve in a week or if worsening

## 2014-05-25 NOTE — Assessment & Plan Note (Signed)
Re assuring exam Given albuterol mdi px with inst for use for wheezing if needed Disc symptomatic care - see instructions on AVS  Update if not starting to improve in a week or if worsening

## 2014-05-25 NOTE — Patient Instructions (Signed)
I think you have a viral head and chest cold  It may still get worse before it gets better  Rest when you can and drink lots of fluids Use the inhaler as needed for wheezing -if much worse let me know  Tylenol for fever if needed mucinex for congestion and cough (mucinex DM would help cough)  Nasal saline spray may also help congestion    Update if not starting to improve in a week or if worsening

## 2014-06-03 ENCOUNTER — Telehealth: Payer: Self-pay | Admitting: Family Medicine

## 2014-06-03 MED ORDER — LEVOFLOXACIN 500 MG PO TABS: 500.0000 mg | ORAL_TABLET | Freq: Every day | ORAL | Status: DC

## 2014-06-03 NOTE — Telephone Encounter (Signed)
Patient notified as instructed by telephone and verbalized understanding. 

## 2014-06-03 NOTE — Telephone Encounter (Signed)
I sent levaquin to [facility]  Take as directed  Update if not starting to improve in a week or if worsening

## 2014-06-03 NOTE — Telephone Encounter (Signed)
Pt came in office today requesting abx for his sinus infection. Pt could not get inhaler filled due to cost. 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 Pt has pressure under and above eyes. He was taking generic sudafed. His cough is still bad with coughing up green mucus. Pt bp was high during cpe yesterday; 174 systolic, does not remember bottom number. He states his bp is usually very good. He states it could have been nervousness yesterday but not sure.   [facility] [address]  Pt request c/b with what he should do b/c his work wont let him come back until his sinus inf is gone # [phone]

## 2014-06-24 ENCOUNTER — Ambulatory Visit (INDEPENDENT_AMBULATORY_CARE_PROVIDER_SITE_OTHER): Payer: Medicare Other | Admitting: Family Medicine

## 2014-06-24 ENCOUNTER — Encounter: Payer: Self-pay | Admitting: Family Medicine

## 2014-06-24 VITALS — BP 141/82 | HR 64 | Temp 97.8°F | Ht 68.0 in | Wt 186.0 lb

## 2014-06-24 DIAGNOSIS — R030 Elevated blood-pressure reading, without diagnosis of hypertension: Secondary | ICD-10-CM | POA: Insufficient documentation

## 2014-06-24 NOTE — Patient Instructions (Signed)
You may be developing high blood pressure  Aim to work up to exercise 30 minutes or more 5 days per week  Take a look at the [facility] diet plan  Follow up with me in about 3 weeks

## 2014-06-24 NOTE — Progress Notes (Signed)
Subjective:    Patient ID: George Fowler, male    DOB: Mar 24, 1965, 49 y.o.   MRN: [id number]  HPI Here for concerns about blood pressure   Was told to f/u after sinus infection  bp was elevated - it was 176/81  He has to keep up with DOT physicals -has until May   BP Readings from Last 3 Encounters:  06/24/14 140/88  05/25/14 138/82  02/28/14 138/88   re check 141/82 Is up just a little today   Has fam hx of HTN - both parents and his older brother Is overwt  Does exercise (unless he is sick) - goes to the gym  Over his sinus illness - feels better now and ready to go back   Diet - eats chicken / green beans / corn etc Avoids junk food -is pretty careful   Avoids bread/ french fries - except occ on the weekends  Eats a lot cheese   Patient Active Problem List   Diagnosis Date Noted  . Viral URI with cough 05/25/2014  . Allergic rhinitis 02/28/2014  . Bee sting reaction 11/23/2013  . Eczema 11/20/2012  . 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 Toenail bruise 11/20/2012  . Routine general medical examination at a health care facility 05/27/2012  . Foot pain, left 11/25/2011  . Fungal toenail infection 11/25/2011  . Diastasis recti 08/23/2011  . 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 ELEVATED BLOOD PRESSURE WITHOUT DIAGNOSIS OF HYPERTENSION 02/22/2010   Past Medical History  Diagnosis Date  . Seasonal allergic rhinitis     with cough  . Foot injury     partially disabled from  . Neuropathy of leg     Chronic; bilateral--thinks it was from working on hard floors and favoring right foot  . No pertinent past medical history   . Umbilical hernia 11/14/2010    Repaired 09/25/11    Past Surgical History  Procedure Laterality Date  . Foot surgery  2003-lt    plate in big toe; pin in 2nd toe due to 2 ton beam falling on foot (2 surgeries on foot)  . Foot arthroplasty  2003    lt foot x3  . Eye surgery  2005    rt   . Umbilical hernia repair   09/25/2011    Procedure: HERNIA REPAIR UMBILICAL ADULT;  Surgeon: Currie Paris, MD;  Location: [facility];  Service: General;  Laterality: N/A;  umbilical hernia repair    History  Substance Use Topics  . Smoking status: Former Smoker    Quit date: 03/18/1990  . Smokeless tobacco: Never Used     Comment: quit after smoking x 3 years---in his 20's  . Alcohol Use: 0.0 oz/week    1-2 Cans of beer per week     Comment: 1-2 per night   Family History  Problem Relation Age of Onset  . Hypertension Father   . Hypertension Mother   . Diabetes Mother   . Heart disease Paternal Grandmother   . Heart disease Paternal Grandfather   . Cancer      uncle---unsure of what kind   Allergies  Allergen Reactions  . Penicillins     REACTION: rash   Current Outpatient Prescriptions on File Prior to Visit  Medication Sig Dispense Refill  . albuterol (PROVENTIL HFA;VENTOLIN HFA) 108 (90 BASE) MCG/ACT inhaler Inhale 2 puffs into the lungs every 4 (four) hours as needed for wheezing. 1 Inhaler 0  . Calcium Carbonate-Vitamin D (  CALCIUM 600+D) 600-400 MG-UNIT per tablet Take 1 tablet by mouth daily.     . Chlorphen-PE-Acetaminophen (COLD MULTI-SYMPTOM PO) Take by mouth as needed.    . cholecalciferol (VITAMIN D) 400 UNITS TABS Take 400 Units by mouth daily.      . fluticasone (FLONASE) 50 MCG/ACT nasal spray Place 2 sprays into both nostrils daily. 16 g 6  . gabapentin (NEURONTIN) 300 MG capsule Take 300-600 mg by mouth 3 (three) times daily as needed.     . mometasone (ELOCON) 0.1 % cream APPLY TO AFFECTED AREA ONCE DAILY 15 g 0  . mupirocin ointment (BACTROBAN) 2 % Apply 1 application topically daily. 22 g 1  . vitamin B-12 (CYANOCOBALAMIN) 500 MCG tablet Take 500 mcg by mouth 2 (two) times daily.      . vitamin E 400 UNIT capsule Take 400 Units by mouth daily.       No current facility-administered medications on file prior to visit.       Review of Systems Review of  Systems  Constitutional: Negative for fever, appetite change, fatigue and unexpected weight change.  Eyes: Negative for pain and visual disturbance.  Respiratory: Negative for cough and shortness of breath.   Cardiovascular: Negative for cp or palpitations    Gastrointestinal: Negative for nausea, diarrhea and constipation.  Genitourinary: Negative for urgency and frequency.  Skin: Negative for pallor or rash   Neurological: Negative for weakness, light-headedness, numbness and headaches.  Hematological: Negative for adenopathy. Does not bruise/bleed easily.  Psychiatric/Behavioral: Negative for dysphoric mood. The patient is not nervous/anxious.         Objective:   Physical Exam  Constitutional: He appears well-developed and well-nourished. No distress.  overwt and well appearing   HENT:  Head: Normocephalic and atraumatic.  Mouth/Throat: Oropharynx is clear and moist.  Eyes: Conjunctivae and EOM are normal. Pupils are equal, round, and reactive to light. Right eye exhibits no discharge. Left eye exhibits no discharge. No scleral icterus.  Neck: Normal range of motion. Neck supple. No JVD present. Carotid bruit is not present. No thyromegaly present.  Cardiovascular: Normal rate, regular rhythm, normal heart sounds and intact distal pulses.  Exam reveals no gallop.   Pulmonary/Chest: Effort normal and breath sounds normal. No respiratory distress. He has no wheezes. He has no rales. He exhibits no tenderness.  No crackles  Abdominal: Soft. Bowel sounds are normal. He exhibits no distension, no abdominal bruit and no mass. There is no tenderness.  Musculoskeletal: He exhibits no edema.  Lymphadenopathy:    He has no cervical adenopathy.  Neurological: He is alert. He has normal reflexes. No cranial nerve deficit. He exhibits normal muscle tone. Coordination normal.  Skin: Skin is warm and dry. No rash noted. No erythema. No pallor.  Psychiatric: He has a normal mood and affect.           Assessment & Plan:   Problem List Items Addressed This Visit      Other   Elevated blood pressure (not hypertension) - Primary   ELEVATED BLOOD PRESSURE WITHOUT DIAGNOSIS OF HYPERTENSION    BP: (!) 141/82 mmHg    Will work on lifestyle change  Rev fam hx and soc hx  Given copy of DASH eating plan  Will work on exercise F/u in 3 mo

## 2014-06-24 NOTE — Progress Notes (Signed)
Pre visit review using our clinic review tool, if applicable. No additional management support is needed unless otherwise documented below in the visit note. 

## 2014-06-26 NOTE — Assessment & Plan Note (Addendum)
BP: (!) 141/82 mmHg    Will work on lifestyle change  Rev fam hx and soc hx  Given copy of DASH eating plan  Will work on exercise F/u in 3 weeks

## 2014-07-15 ENCOUNTER — Ambulatory Visit (INDEPENDENT_AMBULATORY_CARE_PROVIDER_SITE_OTHER): Payer: Medicare Other | Admitting: Family Medicine

## 2014-07-15 ENCOUNTER — Encounter: Payer: Self-pay | Admitting: Family Medicine

## 2014-07-15 VITALS — BP 135/85 | HR 54 | Temp 98.2°F | Ht 68.0 in | Wt 182.0 lb

## 2014-07-15 DIAGNOSIS — N181 Chronic kidney disease, stage 1: Secondary | ICD-10-CM | POA: Insufficient documentation

## 2014-07-15 DIAGNOSIS — I10 Essential (primary) hypertension: Secondary | ICD-10-CM | POA: Diagnosis not present

## 2014-07-15 DIAGNOSIS — R030 Elevated blood-pressure reading, without diagnosis of hypertension: Secondary | ICD-10-CM

## 2014-07-15 DIAGNOSIS — I129 Hypertensive chronic kidney disease with stage 1 through stage 4 chronic kidney disease, or unspecified chronic kidney disease: Secondary | ICD-10-CM | POA: Insufficient documentation

## 2014-07-15 DIAGNOSIS — N1831 Chronic kidney disease, stage 3a: Secondary | ICD-10-CM | POA: Insufficient documentation

## 2014-07-15 MED ORDER — AMLODIPINE BESYLATE 5 MG PO TABS: 5.0000 mg | ORAL_TABLET | Freq: Every day | ORAL | Status: DC

## 2014-07-15 NOTE — Progress Notes (Signed)
Pre visit review using our clinic review tool, if applicable. No additional management support is needed unless otherwise documented below in the visit note. 

## 2014-07-15 NOTE — Patient Instructions (Signed)
Blood pressure is improving  Great job with lifestyle change  Keep up the great exercise  Try amlodipine 5 mg daily - if any side effects let me know  Then you can try coming off of it if bp stays below 140/90

## 2014-07-15 NOTE — Progress Notes (Signed)
Subjective:    Patient ID: George Fowler, male    DOB: 1966/01/16, 49 y.o.   MRN: [id number]  HPI Here for f/u of elevated bp   Wt is down 4-5 lb with bmi of 27  Following DASH eating plan  Watching processed foods and salt Walking for exercise and working out at the gym  Uses an app that counts steps also   BP Readings from Last 3 Encounters:  07/15/14 130/86  06/24/14 141/82  05/25/14 138/82    Checked it at home - on Wed was 137/81 with 73 pulse   Has DOT physical coming up   Patient Active Problem List   Diagnosis Date Noted  . Elevated blood pressure (not hypertension) 06/24/2014  . Viral URI with cough 05/25/2014  . Allergic rhinitis 02/28/2014  . Bee sting reaction 11/23/2013  . Eczema 11/20/2012  . 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 Toenail bruise 11/20/2012  . Routine general medical examination at a health care facility 05/27/2012  . Foot pain, left 11/25/2011  . Fungal toenail infection 11/25/2011  . Diastasis recti 08/23/2011  . 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 ELEVATED BLOOD PRESSURE WITHOUT DIAGNOSIS OF HYPERTENSION 02/22/2010   Past Medical History  Diagnosis Date  . Seasonal allergic rhinitis     with cough  . Foot injury     partially disabled from  . Neuropathy of leg     Chronic; bilateral--thinks it was from working on hard floors and favoring right foot  . No pertinent past medical history   . Umbilical hernia 11/14/2010    Repaired 09/25/11    Past Surgical History  Procedure Laterality Date  . Foot surgery  2003-lt    plate in big toe; pin in 2nd toe due to 2 ton beam falling on foot (2 surgeries on foot)  . Foot arthroplasty  2003    lt foot x3  . Eye surgery  2005    rt   . Umbilical hernia repair  09/25/2011    Procedure: HERNIA REPAIR UMBILICAL ADULT;  Surgeon: Currie Paris, MD;  Location: [facility];  Service: General;  Laterality: N/A;  umbilical hernia repair    History    Substance Use Topics  . Smoking status: Former Smoker    Quit date: 03/18/1990  . Smokeless tobacco: Never Used     Comment: quit after smoking x 3 years---in his 20's  . Alcohol Use: 0.0 oz/week    1-2 Cans of beer per week     Comment: 1-2 per night   Family History  Problem Relation Age of Onset  . Hypertension Father   . Hypertension Mother   . Diabetes Mother   . Heart disease Paternal Grandmother   . Heart disease Paternal Grandfather   . Cancer      uncle---unsure of what kind   Allergies  Allergen Reactions  . Penicillins     REACTION: rash   Current Outpatient Prescriptions on File Prior to Visit  Medication Sig Dispense Refill  . albuterol (PROVENTIL HFA;VENTOLIN HFA) 108 (90 BASE) MCG/ACT inhaler Inhale 2 puffs into the lungs every 4 (four) hours as needed for wheezing. 1 Inhaler 0  . Calcium Carbonate-Vitamin D (CALCIUM 600+D) 600-400 MG-UNIT per tablet Take 1 tablet by mouth daily.     . Chlorphen-PE-Acetaminophen (COLD MULTI-SYMPTOM PO) Take by mouth as needed.    . cholecalciferol (VITAMIN D) 400 UNITS TABS Take 400 Units by mouth daily.      Marland Kitchen  fluticasone (FLONASE) 50 MCG/ACT nasal spray Place 2 sprays into both nostrils daily. 16 g 6  . gabapentin (NEURONTIN) 300 MG capsule Take 300-600 mg by mouth 3 (three) times daily as needed.     . mometasone (ELOCON) 0.1 % cream APPLY TO AFFECTED AREA ONCE DAILY 15 g 0  . mupirocin ointment (BACTROBAN) 2 % Apply 1 application topically daily. 22 g 1  . vitamin B-12 (CYANOCOBALAMIN) 500 MCG tablet Take 500 mcg by mouth 2 (two) times daily.      . vitamin E 400 UNIT capsule Take 400 Units by mouth daily.       No current facility-administered medications on file prior to visit.      Review of Systems Review of Systems  Constitutional: Negative for fever, appetite change, fatigue and unexpected weight change.  Eyes: Negative for pain and visual disturbance.  Respiratory: Negative for cough and shortness of breath.    Cardiovascular: Negative for cp or palpitations    Gastrointestinal: Negative for nausea, diarrhea and constipation.  Genitourinary: Negative for urgency and frequency.  Skin: Negative for pallor or rash   Neurological: Negative for weakness, light-headedness, numbness and headaches.  Hematological: Negative for adenopathy. Does not bruise/bleed easily.  Psychiatric/Behavioral: Negative for dysphoric mood. The patient is not nervous/anxious.         Objective:   Physical Exam  Constitutional: He appears well-developed and well-nourished. No distress.  overwt and well app  HENT:  Head: Normocephalic and atraumatic.  Mouth/Throat: Oropharynx is clear and moist.  Eyes: Conjunctivae and EOM are normal. Pupils are equal, round, and reactive to light.  Neck: Normal range of motion. Neck supple. No JVD present. Carotid bruit is not present. No thyromegaly present.  Cardiovascular: Normal rate, regular rhythm, normal heart sounds and intact distal pulses.  Exam reveals no gallop.   Pulmonary/Chest: Effort normal and breath sounds normal. No respiratory distress. He has no wheezes. He has no rales.  No crackles  Abdominal: Soft. Bowel sounds are normal. He exhibits no distension, no abdominal bruit and no mass. There is no tenderness.  Musculoskeletal: He exhibits no edema.  Lymphadenopathy:    He has no cervical adenopathy.  Neurological: He is alert. He has normal reflexes.  Skin: Skin is warm and dry. No rash noted.  Psychiatric: He has a normal mood and affect.          Assessment & Plan:   Problem List Items Addressed This Visit      Cardiovascular and Mediastinum   Essential hypertension, benign    Doing very well with lifestyle change BP: 135/85 mmHg   Much imp  Will take amlodipine 5 mg daily- disc poss side eff  After DOT physical if well controlled-will likely be able to come off of it -esp if more wt loss   F/u 3 mo  Needs labs/ is needle phobic-will disc next  time      Relevant Medications   amLODipine (NORVASC) 5 MG tablet     Other   RESOLVED: Elevated blood pressure (not hypertension) - Primary

## 2014-07-15 NOTE — Assessment & Plan Note (Signed)
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

## 2014-10-14 ENCOUNTER — Ambulatory Visit: Payer: Medicare Other | Admitting: Family Medicine

## 2014-10-14 ENCOUNTER — Telehealth: Payer: Self-pay | Admitting: Family Medicine

## 2014-10-14 ENCOUNTER — Ambulatory Visit (INDEPENDENT_AMBULATORY_CARE_PROVIDER_SITE_OTHER): Payer: Medicare Other | Admitting: Family Medicine

## 2014-10-14 ENCOUNTER — Encounter: Payer: Self-pay | Admitting: Family Medicine

## 2014-10-14 VITALS — BP 130/86 | HR 66 | Temp 98.5°F | Ht 68.0 in | Wt 184.8 lb

## 2014-10-14 DIAGNOSIS — I10 Essential (primary) hypertension: Secondary | ICD-10-CM

## 2014-10-14 NOTE — Telephone Encounter (Signed)
Patient was seen this morning by Dr.Tower.  Dr.Tower mentioned lab work being done.  Patient called back and said he would like to have lab work done.

## 2014-10-14 NOTE — Patient Instructions (Signed)
Stop the blood pressure medicine if you do not want to take it  By living a healthstyle- that may help a little - exercise and low sodium diet and weight control  If you change your mind- come back  If you are ever interested in any health preventative measures - come back

## 2014-10-14 NOTE — Addendum Note (Signed)
Addended by: Roxy Manns A on: 10/14/2014 04:57 PM   Modules accepted: Orders

## 2014-10-14 NOTE — Progress Notes (Signed)
Subjective:    Patient ID: George Fowler, male    DOB: 05/13/1965, 49 y.o.   MRN: [id number]  HPI Here for f/u of HTN  bp is stable today  No cp or palpitations or headaches or edema  No side effects to medicines  BP Readings from Last 3 Encounters:  10/14/14 130/86  07/15/14 135/85  06/24/14 141/82     Pretty stable bp overall  Today is the first time he has taken his medication  Does not want to take it   At home -it was "a bit high" - ? 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 Essential hypertension, benign 07/15/2014  . Allergic rhinitis 02/28/2014  . Bee sting reaction 11/23/2013  . Eczema 11/20/2012  . 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 Toenail bruise 11/20/2012  . Routine general medical examination at a health care facility 05/27/2012  . Foot pain, left 11/25/2011  . Fungal toenail infection 11/25/2011  . Diastasis recti 08/23/2011  . 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 ELEVATED BLOOD PRESSURE WITHOUT DIAGNOSIS OF HYPERTENSION 02/22/2010   Past Medical History  Diagnosis Date  . Seasonal allergic rhinitis     with cough  . Foot injury     partially disabled from  . Neuropathy of leg     Chronic; bilateral--thinks it was from working on hard floors and favoring right foot  . No pertinent past medical history   . Umbilical hernia 11/14/2010    Repaired 09/25/11    Past Surgical History  Procedure Laterality Date  . Foot surgery  2003-lt    plate in big toe; pin in 2nd toe due to 2 ton beam falling on foot (2 surgeries on foot)  . Foot arthroplasty  2003    lt foot x3  . Eye surgery  2005    rt   . Umbilical hernia repair  09/25/2011    Procedure:  HERNIA REPAIR UMBILICAL ADULT;  Surgeon: Currie Paris, MD;  Location: [facility];  Service: General;  Laterality: N/A;  umbilical hernia repair    History  Substance Use Topics  . Smoking status: Former Smoker    Quit date: 03/18/1990  . Smokeless tobacco: Never Used     Comment: quit after smoking x 3 years---in his 20's  . Alcohol Use: 0.0 oz/week    1-2 Cans of beer per week     Comment: 1-2 per night   Family History  Problem Relation Age of Onset  . Hypertension Father   . Hypertension Mother   . Diabetes Mother   . Heart disease Paternal Grandmother   . Heart disease Paternal Grandfather   . Cancer      uncle---unsure of what kind   Allergies  Allergen Reactions  . Penicillins     REACTION: rash   Current Outpatient Prescriptions on File Prior to Visit  Medication Sig Dispense Refill  . albuterol (PROVENTIL HFA;VENTOLIN HFA) 108 (90 BASE) MCG/ACT inhaler Inhale 2 puffs into the lungs every 4 (four) hours as needed for wheezing. 1 Inhaler 0  . amLODipine (NORVASC) 5 MG tablet Take 1 tablet (  5 mg total) by mouth daily. 30 tablet 5  . Calcium Carbonate-Vitamin D (CALCIUM 600+D) 600-400 MG-UNIT per tablet Take 1 tablet by mouth daily.     . Chlorphen-PE-Acetaminophen (COLD MULTI-SYMPTOM PO) Take by mouth as needed.    . cholecalciferol (VITAMIN D) 400 UNITS TABS Take 400 Units by mouth daily.      . fluticasone (FLONASE) 50 MCG/ACT nasal spray Place 2 sprays into both nostrils daily. 16 g 6  . gabapentin (NEURONTIN) 300 MG capsule Take 300-600 mg by mouth 3 (three) times daily as needed.     . mometasone (ELOCON) 0.1 % cream APPLY TO AFFECTED AREA ONCE DAILY 15 g 0  . mupirocin ointment (BACTROBAN) 2 % Apply 1 application topically daily. 22 g 1  . vitamin B-12 (CYANOCOBALAMIN) 500 MCG tablet Take 500 mcg by mouth 2 (two) times daily.      . vitamin E 400 UNIT capsule Take 400 Units by mouth daily.       No current facility-administered medications on  file prior to visit.    Review of Systems    Review of Systems  Constitutional: Negative for fever, appetite change, fatigue and unexpected weight change.  Eyes: Negative for pain and visual disturbance.  Respiratory: Negative for cough and shortness of breath.   Cardiovascular: Negative for cp or palpitations    Gastrointestinal: Negative for nausea, diarrhea and constipation.  Genitourinary: Negative for urgency and frequency.  Skin: Negative for pallor or rash   Neurological: Negative for weakness, light-headedness, numbness and headaches.  Hematological: Negative for adenopathy. Does not bruise/bleed easily.  Psychiatric/Behavioral: Negative for dysphoric mood. The patient is not nervous/anxious.      Objective:   Physical Exam  Constitutional: He appears well-developed and well-nourished. No distress.  overwt and well app  HENT:  Head: Normocephalic and atraumatic.  Mouth/Throat: Oropharynx is clear and moist.  Eyes: Conjunctivae and EOM are normal. Pupils are equal, round, and reactive to light.  Neck: Normal range of motion. Neck supple. No JVD present. Carotid bruit is not present. No thyromegaly present.  Cardiovascular: Normal rate, regular rhythm, normal heart sounds and intact distal pulses.  Exam reveals no gallop.   Pulmonary/Chest: Effort normal and breath sounds normal. No respiratory distress. He has no wheezes. He has no rales.  No crackles  Abdominal: Soft. Bowel sounds are normal. He exhibits no distension, no abdominal bruit and no mass. There is no tenderness.  Musculoskeletal: He exhibits no edema.  Lymphadenopathy:    He has no cervical adenopathy.  Neurological: He is alert. He has normal reflexes.  Skin: Skin is warm and dry. No rash noted.  Psychiatric: He has a normal mood and affect.          Assessment & Plan:   Problem List Items Addressed This Visit    Essential hypertension, benign - Primary    Pt took one dose of amlodipine this am - but  has decided not to take any more  He states he has thought about the risks and would rather not treat the condition Disc risks of HTN over time incl vascular dz/MI/CVA/vision problems and kidney damage- he voiced understanding  He also declines any health mt at all / declines preventative measures or screening    I urged him to return if he changes his mind   Also urged him to continue good lifestyle habits with diet/exercise (DASH)

## 2014-10-14 NOTE — Assessment & Plan Note (Addendum)
Pt took one dose of amlodipine this am - but has decided not to take any more  He states he has thought about the risks and would rather not treat the condition Disc risks of HTN over time incl vascular dz/MI/CVA/vision problems and kidney damage- he voiced understanding  He also declines any health mt at all / declines preventative measures or screening    I urged him to return if he changes his mind   Also urged him to continue good lifestyle habits with diet/exercise (DASH)

## 2014-10-14 NOTE — Telephone Encounter (Signed)
I will put in a future lab order for whenever he wants to schedule  Come fasting please (or eat light)

## 2014-10-14 NOTE — Progress Notes (Signed)
Pre visit review using our clinic review tool, if applicable. No additional management support is needed unless otherwise documented below in the visit note. 

## 2014-10-17 NOTE — Telephone Encounter (Signed)
appt scheduled

## 2014-10-19 ENCOUNTER — Other Ambulatory Visit (INDEPENDENT_AMBULATORY_CARE_PROVIDER_SITE_OTHER): Payer: Medicare Other

## 2014-10-19 DIAGNOSIS — I10 Essential (primary) hypertension: Secondary | ICD-10-CM | POA: Diagnosis not present

## 2014-10-19 LAB — CBC WITH DIFFERENTIAL/PLATELET
BASOS ABS: 0 10*3/uL (ref 0.0–0.1)
BASOS PCT: 0.5 % (ref 0.0–3.0)
Eosinophils Absolute: 0.1 10*3/uL (ref 0.0–0.7)
Eosinophils Relative: 1.5 % (ref 0.0–5.0)
HEMATOCRIT: 45.4 % (ref 39.0–52.0)
HEMOGLOBIN: 15.4 g/dL (ref 13.0–17.0)
Lymphocytes Relative: 24 % (ref 12.0–46.0)
Lymphs Abs: 1.8 10*3/uL (ref 0.7–4.0)
MCHC: 33.8 g/dL (ref 30.0–36.0)
MCV: 93.8 fl (ref 78.0–100.0)
MONO ABS: 0.5 10*3/uL (ref 0.1–1.0)
Monocytes Relative: 7.1 % (ref 3.0–12.0)
NEUTROS ABS: 5 10*3/uL (ref 1.4–7.7)
NEUTROS PCT: 66.9 % (ref 43.0–77.0)
Platelets: 166 10*3/uL (ref 150.0–400.0)
RBC: 4.84 Mil/uL (ref 4.22–5.81)
RDW: 12.8 % (ref 11.5–15.5)
WBC: 7.5 10*3/uL (ref 4.0–10.5)

## 2014-10-19 LAB — COMPREHENSIVE METABOLIC PANEL
ALBUMIN: 4.3 g/dL (ref 3.5–5.2)
ALT: 28 U/L (ref 0–53)
AST: 23 U/L (ref 0–37)
Alkaline Phosphatase: 70 U/L (ref 39–117)
BUN: 18 mg/dL (ref 6–23)
CALCIUM: 9.3 mg/dL (ref 8.4–10.5)
CHLORIDE: 104 meq/L (ref 96–112)
CO2: 30 meq/L (ref 19–32)
CREATININE: 1.28 mg/dL (ref 0.40–1.50)
GFR: 63.86 mL/min (ref >60.00)
Glucose, Bld: 97 mg/dL (ref 70–99)
Potassium: 4.5 mEq/L (ref 3.5–5.1)
Sodium: 140 mEq/L (ref 135–145)
TOTAL PROTEIN: 6.7 g/dL (ref 6.0–8.3)
Total Bilirubin: 0.7 mg/dL (ref 0.2–1.2)

## 2014-10-19 LAB — LIPID PANEL
CHOLESTEROL: 192 mg/dL (ref 0–200)
HDL: 35.8 mg/dL — AB (ref >39.00)
NonHDL: 156.57
Total CHOL/HDL Ratio: 5
Triglycerides: 265 mg/dL — ABNORMAL HIGH (ref 0.0–149.0)
VLDL: 53 mg/dL — AB (ref 0.0–40.0)

## 2014-10-19 LAB — LDL CHOLESTEROL, DIRECT: Direct LDL: 105 mg/dL

## 2014-10-19 LAB — TSH: TSH: 1.5 u[IU]/mL (ref 0.35–4.50)

## 2014-10-21 ENCOUNTER — Encounter: Payer: Self-pay | Admitting: *Deleted

## 2015-04-17 ENCOUNTER — Encounter: Payer: Self-pay | Admitting: Family Medicine

## 2015-04-17 ENCOUNTER — Ambulatory Visit (INDEPENDENT_AMBULATORY_CARE_PROVIDER_SITE_OTHER): Payer: Medicare Other | Admitting: Family Medicine

## 2015-04-17 VITALS — BP 122/80 | HR 62 | Temp 98.5°F | Ht 68.0 in | Wt 190.5 lb

## 2015-04-17 DIAGNOSIS — I10 Essential (primary) hypertension: Secondary | ICD-10-CM | POA: Diagnosis not present

## 2015-04-17 DIAGNOSIS — E785 Hyperlipidemia, unspecified: Secondary | ICD-10-CM | POA: Insufficient documentation

## 2015-04-17 DIAGNOSIS — E786 Lipoprotein deficiency: Secondary | ICD-10-CM

## 2015-04-17 MED ORDER — AMLODIPINE BESYLATE 5 MG PO TABS: 5.0000 mg | ORAL_TABLET | Freq: Every day | ORAL | Status: DC

## 2015-04-17 NOTE — Assessment & Plan Note (Signed)
bp in fair control at this time  BP Readings from Last 1 Encounters:  04/17/15 122/80   No changes needed Disc lifstyle change with low sodium diet and exercise  Pleased with how he is doing  Commended the exercise  Enc wt loss

## 2015-04-17 NOTE — Progress Notes (Signed)
Subjective:    Patient ID: George Fowler, male    DOB: 1965/08/06, 50 y.o.   MRN: [id number]  HPI Here for f/u of chronic health problems   bp is stable today  No cp or palpitations or headaches or edema  No side effects to medicines  BP Readings from Last 3 Encounters:  04/17/15 132/88  10/14/14 130/86  07/15/14 135/85     Takes amlodipine 5 mg daily   Wt is up 6 lb with bmi of 28 - in overwt category  Is trying to eat a healthy diet- staying away from bread in general/carbs  occ pita bread  Also sweets (occasional)   Is getting exercise - still goes to the gym for that 3 days per week    Lab on 10/19/2014  Component Date Value Ref Range Status  . WBC 10/19/2014 7.5  4.0 - 10.5 K/uL Final  . RBC 10/19/2014 4.84  4.22 - 5.81 Mil/uL Final  . Hemoglobin 10/19/2014 15.4  13.0 - 17.0 g/dL Final  . HCT 40/98/1191 45.4  39.0 - 52.0 % Final  . MCV 10/19/2014 93.8  78.0 - 100.0 fl Final  . MCHC 10/19/2014 33.8  30.0 - 36.0 g/dL Final  . RDW 47/82/9562 12.8  11.5 - 15.5 % Final  . Platelets 10/19/2014 166.0  150.0 - 400.0 K/uL Final  . Neutrophils Relative % 10/19/2014 66.9  43.0 - 77.0 % Final  . Lymphocytes Relative 10/19/2014 24.0  12.0 - 46.0 % Final  . Monocytes Relative 10/19/2014 7.1  3.0 - 12.0 % Final  . Eosinophils Relative 10/19/2014 1.5  0.0 - 5.0 % Final  . Basophils Relative 10/19/2014 0.5  0.0 - 3.0 % Final  . Neutro Abs 10/19/2014 5.0  1.4 - 7.7 K/uL Final  . Lymphs Abs 10/19/2014 1.8  0.7 - 4.0 K/uL Final  . Monocytes Absolute 10/19/2014 0.5  0.1 - 1.0 K/uL Final  . Eosinophils Absolute 10/19/2014 0.1  0.0 - 0.7 K/uL Final  . Basophils Absolute 10/19/2014 0.0  0.0 - 0.1 K/uL Final  . Sodium 10/19/2014 140  135 - 145 mEq/L Final  . Potassium 10/19/2014 4.5  3.5 - 5.1 mEq/L Final  . Chloride 10/19/2014 104  96 - 112 mEq/L Final  . CO2 10/19/2014 30  19 - 32 mEq/L Final  . Glucose, Bld 10/19/2014 97  70 - 99 mg/dL Final  . BUN 13/10/6576 18  6 - 23 mg/dL  Final  . Creatinine, Ser 10/19/2014 1.28  0.40 - 1.50 mg/dL Final  . Total Bilirubin 10/19/2014 0.7  0.2 - 1.2 mg/dL Final  . Alkaline Phosphatase 10/19/2014 70  39 - 117 U/L Final  . AST 10/19/2014 23  0 - 37 U/L Final  . ALT 10/19/2014 28  0 - 53 U/L Final  . Total Protein 10/19/2014 6.7  6.0 - 8.3 g/dL Final  . Albumin 46/96/2952 4.3  3.5 - 5.2 g/dL Final  . Calcium 84/13/2440 9.3  8.4 - 10.5 mg/dL Final  . GFR 01/12/2535 63.86  >60.00 mL/min Final  . Cholesterol 10/19/2014 192  0 - 200 mg/dL Final   ATP III Classification       Desirable:  < 200 mg/dL               Borderline High:  200 - 239 mg/dL          High:  > = 644 mg/dL  . Triglycerides 10/19/2014 265.0* 0.0 - 149.0 mg/dL Final   Normal:  <034  mg/dLBorderline High:  150 - 199 mg/dL  . HDL 10/19/2014 35.80* >39.00 mg/dL Final  . VLDL 13/10/6576 53.0* 0.0 - 40.0 mg/dL Final  . Total CHOL/HDL Ratio 10/19/2014 5   Final                  Men          Women1/2 Average Risk     3.4          3.3Average Risk          5.0          4.42X Average Risk          9.6          7.13X Average Risk          15.0          11.0                      . NonHDL 10/19/2014 156.57   Final   NOTE:  Non-HDL goal should be 30 mg/dL higher than patient's LDL goal (i.e. LDL goal of < 70 mg/dL, would have non-HDL goal of < 100 mg/dL)  . TSH 10/19/2014 1.50  0.35 - 4.50 uIU/mL Final  . Direct LDL 10/19/2014 105.0   Final   Optimal:  <100 mg/dLNear or Above Optimal:  100-129 mg/dLBorderline High:  130-159 mg/dLHigh:  160-189 mg/dLVery High:  >190 mg/dL    Triglycerides high and HDL not optimal  Has never taken fish oil/does not like fish  For triglycerides - he is already watching his sugar, and avoids fat by eating grilled foods Perhaps too much cheese  Laid off the whole eggs    Patient Active Problem List   Diagnosis Date Noted  . Low HDL (under 40) 04/17/2015  . Essential hypertension, benign 07/15/2014  . Allergic rhinitis 02/28/2014  . Bee sting  reaction 11/23/2013  . Eczema 11/20/2012  . 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 Toenail bruise 11/20/2012  . Fungal toenail infection 11/25/2011  . Diastasis recti 08/23/2011  . PERIPHERAL NEUROPATHY, FEET 02/22/2010   Past Medical History  Diagnosis Date  . Seasonal allergic rhinitis     with cough  . Foot injury     partially disabled from  . Neuropathy of leg     Chronic; bilateral--thinks it was from working on hard floors and favoring right foot  . No pertinent past medical history   . Umbilical hernia 11/14/2010    Repaired 09/25/11    Past Surgical History  Procedure Laterality Date  . Foot surgery  2003-lt    plate in big toe; pin in 2nd toe due to 2 ton beam falling on foot (2 surgeries on foot)  . Foot arthroplasty  2003    lt foot x3  . Eye surgery  2005    rt   . Umbilical hernia repair  09/25/2011    Procedure: HERNIA REPAIR UMBILICAL ADULT;  Surgeon: Currie Paris, MD;  Location: [facility];  Service: General;  Laterality: N/A;  umbilical hernia repair    Social History  Substance Use Topics  . Smoking status: Former Smoker    Quit date: 03/18/1990  . Smokeless tobacco: Never Used     Comment: quit after smoking x 3 years---in his 20's  . Alcohol Use: 0.0 oz/week    1-2 Cans of beer per week     Comment: 1-2 per night   Family History  Problem Relation  Age of Onset  . Hypertension Father   . Hypertension Mother   . Diabetes Mother   . Heart disease Paternal Grandmother   . Heart disease Paternal Grandfather   . Cancer      uncle---unsure of what kind   Allergies  Allergen Reactions  . Penicillins     REACTION: rash   Current Outpatient Prescriptions on File Prior to Visit  Medication Sig Dispense Refill  . albuterol (PROVENTIL HFA;VENTOLIN HFA) 108 (90 BASE) MCG/ACT inhaler Inhale 2 puffs into the lungs every 4 (four) hours as needed for wheezing. 1 Inhaler 0  . Calcium Carbonate-Vitamin D (CALCIUM 600+D) 600-400  MG-UNIT per tablet Take 1 tablet by mouth daily.     . Chlorphen-PE-Acetaminophen (COLD MULTI-SYMPTOM PO) Take by mouth as needed.    . cholecalciferol (VITAMIN D) 400 UNITS TABS Take 400 Units by mouth daily.      . fluticasone (FLONASE) 50 MCG/ACT nasal spray Place 2 sprays into both nostrils daily. 16 g 6  . gabapentin (NEURONTIN) 300 MG capsule Take 300-600 mg by mouth 3 (three) times daily as needed.     . mometasone (ELOCON) 0.1 % cream APPLY TO AFFECTED AREA ONCE DAILY 15 g 0  . mupirocin ointment (BACTROBAN) 2 % Apply 1 application topically daily. 22 g 1  . vitamin B-12 (CYANOCOBALAMIN) 500 MCG tablet Take 500 mcg by mouth 2 (two) times daily.      . vitamin E 400 UNIT capsule Take 400 Units by mouth daily.       No current facility-administered medications on file prior to visit.     Review of Systems Review of Systems  Constitutional: Negative for fever, appetite change, fatigue and unexpected weight change.  Eyes: Negative for pain and visual disturbance.  Respiratory: Negative for cough and shortness of breath.   Cardiovascular: Negative for cp or palpitations    Gastrointestinal: Negative for nausea, diarrhea and constipation.  Genitourinary: Negative for urgency and frequency.  Skin: Negative for pallor or rash   Neurological: Negative for weakness, light-headedness, numbness and headaches.  Hematological: Negative for adenopathy. Does not bruise/bleed easily.  Psychiatric/Behavioral: Negative for dysphoric mood. The patient is not nervous/anxious.         Objective:   Physical Exam  Constitutional: He appears well-developed and well-nourished. No distress.  overwt and well appearing  HENT:  Head: Normocephalic and atraumatic.  Mouth/Throat: Oropharynx is clear and moist.  Eyes: Conjunctivae and EOM are normal. Pupils are equal, round, and reactive to light.  Neck: Normal range of motion. Neck supple. No JVD present. Carotid bruit is not present. No thyromegaly  present.  Cardiovascular: Normal rate, regular rhythm, normal heart sounds and intact distal pulses.  Exam reveals no gallop.   Pulmonary/Chest: Effort normal and breath sounds normal. No respiratory distress. He has no wheezes. He has no rales.  No crackles  Abdominal: Soft. Bowel sounds are normal. He exhibits no distension, no abdominal bruit and no mass. There is no tenderness.  Musculoskeletal: He exhibits no edema.  Lymphadenopathy:    He has no cervical adenopathy.  Neurological: He is alert. He has normal reflexes.  Skin: Skin is warm and dry. No rash noted.  Psychiatric: He has a normal mood and affect.          Assessment & Plan:   Problem List Items Addressed This Visit      Cardiovascular and Mediastinum   Essential hypertension, benign - Primary    bp in fair control at this  time  BP Readings from Last 1 Encounters:  04/17/15 122/80   No changes needed Disc lifstyle change with low sodium diet and exercise  Pleased with how he is doing  Commended the exercise  Enc wt loss       Relevant Medications   amLODipine (NORVASC) 5 MG tablet     Other   Low HDL (under 40)    Enc exercise Since he does not like fish-enc trial of omega 3 fish oil 1000-3000 mg daily as tolerated Continue to follow  Disc goals for lipids and reasons to control them Rev labs with pt Rev low sat fat diet in detail

## 2015-04-17 NOTE — Assessment & Plan Note (Signed)
Enc exercise Since he does not like fish-enc trial of omega 3 fish oil 1000-3000 mg daily as tolerated Continue to follow  Disc goals for lipids and reasons to control them Rev labs with pt Rev low sat fat diet in detail

## 2015-04-17 NOTE — Progress Notes (Signed)
Pre visit review using our clinic review tool, if applicable. No additional management support is needed unless otherwise documented below in the visit note. 

## 2015-04-17 NOTE — Patient Instructions (Signed)
BP is good  Take care of yourself  For triglycerides eat less sugar and fat  For HDL (to raise it)- try omega 3 fish oil - 1000- 3000 mg daily and also keep exercising

## 2015-08-08 DIAGNOSIS — M79673 Pain in unspecified foot: Secondary | ICD-10-CM | POA: Diagnosis not present

## 2015-08-21 ENCOUNTER — Encounter: Payer: Self-pay | Admitting: Family Medicine

## 2015-08-21 ENCOUNTER — Ambulatory Visit (INDEPENDENT_AMBULATORY_CARE_PROVIDER_SITE_OTHER): Payer: Medicare Other | Admitting: Family Medicine

## 2015-08-21 VITALS — BP 122/80 | HR 74 | Temp 98.3°F | Ht 68.0 in | Wt 187.0 lb

## 2015-08-21 DIAGNOSIS — R197 Diarrhea, unspecified: Secondary | ICD-10-CM | POA: Diagnosis not present

## 2015-08-21 LAB — CBC WITH DIFFERENTIAL/PLATELET
BASOS ABS: 0 10*3/uL (ref 0.0–0.1)
BASOS PCT: 0.4 % (ref 0.0–3.0)
Eosinophils Absolute: 0.2 10*3/uL (ref 0.0–0.7)
Eosinophils Relative: 2.8 % (ref 0.0–5.0)
HEMATOCRIT: 44.8 % (ref 39.0–52.0)
Hemoglobin: 15.1 g/dL (ref 13.0–17.0)
LYMPHS PCT: 25.6 % (ref 12.0–46.0)
Lymphs Abs: 2.2 10*3/uL (ref 0.7–4.0)
MCHC: 33.7 g/dL (ref 30.0–36.0)
MCV: 92.9 fl (ref 78.0–100.0)
MONOS PCT: 6.4 % (ref 3.0–12.0)
Monocytes Absolute: 0.6 10*3/uL (ref 0.1–1.0)
NEUTROS ABS: 5.6 10*3/uL (ref 1.4–7.7)
Neutrophils Relative %: 64.8 % (ref 43.0–77.0)
PLATELETS: 182 10*3/uL (ref 150.0–400.0)
RBC: 4.82 Mil/uL (ref 4.22–5.81)
RDW: 12.6 % (ref 11.5–15.5)
WBC: 8.7 10*3/uL (ref 4.0–10.5)

## 2015-08-21 LAB — COMPREHENSIVE METABOLIC PANEL
ALBUMIN: 4.4 g/dL (ref 3.5–5.2)
ALK PHOS: 76 U/L (ref 39–117)
ALT: 23 U/L (ref 0–53)
AST: 23 U/L (ref 0–37)
BILIRUBIN TOTAL: 0.7 mg/dL (ref 0.2–1.2)
BUN: 13 mg/dL (ref 6–23)
CALCIUM: 9.7 mg/dL (ref 8.4–10.5)
CO2: 30 meq/L (ref 19–32)
Chloride: 105 mEq/L (ref 96–112)
Creatinine, Ser: 1.35 mg/dL (ref 0.40–1.50)
GFR: 59.84 mL/min — AB (ref >60.00)
Glucose, Bld: 112 mg/dL — ABNORMAL HIGH (ref 70–99)
Potassium: 4.9 mEq/L (ref 3.5–5.1)
Sodium: 139 mEq/L (ref 135–145)
Total Protein: 6.9 g/dL (ref 6.0–8.3)

## 2015-08-21 LAB — TSH: TSH: 2.52 u[IU]/mL (ref 0.35–4.50)

## 2015-08-21 NOTE — Assessment & Plan Note (Signed)
More frequent stools that are looser than usual w/o other GI symptoms or diet change  Disc poss of IBS and literature given (enc to try a fiber supplement)  Lab today  Also stool cx and c diff test  If all neg and no improvement - would consider ref to GI  Has not had colonoscopy yet due to age less than 50

## 2015-08-21 NOTE — Patient Instructions (Signed)
Labs today and stool tests for frequent stools We will see how results look and send you to a GI specialist if all is normal  Keep drinking water  You can try a fiber supplement like citrucel to help bulk up and slow down stools in the meantime

## 2015-08-21 NOTE — Progress Notes (Signed)
Pre visit review using our clinic review tool, if applicable. No additional management support is needed unless otherwise documented below in the visit note. 

## 2015-08-21 NOTE — Progress Notes (Signed)
Subjective:    Patient ID: George Fowler, male    DOB: Sep 25, 1965, 50 y.o.   MRN: [id number]  HPI Here for bowel issues   He has bm 2-3 per day  Not normal for him Started 2 1/2 weeks ago == improved a little bit  Looser than they usually are  No urgency  Does not wake up to have bm  Typically happens right after he eats   He initially d/c all his vitamins except calcium (was formerly on a bunch of new vitamins) Takes gabapentin and bp med   No other GI symptoms at all  No susp illness  ? Food poisoning initially - wondered if he ate cereal with milk that was expired - but it smelled and tasted fine No hx of food sensitivity or lactose intol   Has not had a colonoscopy yet due to age  No family hx of bowel dz or cancer that he knows of   No abx or visit to health facility   No change in diet Eats very healthy   Patient Active Problem List   Diagnosis Date Noted  . Diarrhea 08/21/2015  . Low HDL (under 40) 04/17/2015  . Essential hypertension, benign 07/15/2014  . Allergic rhinitis 02/28/2014  . Bee sting reaction 11/23/2013  . Eczema 11/20/2012  . 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 Toenail bruise 11/20/2012  . Fungal toenail infection 11/25/2011  . Diastasis recti 08/23/2011  . PERIPHERAL NEUROPATHY, FEET 02/22/2010   Past Medical History  Diagnosis Date  . Seasonal allergic rhinitis     with cough  . Foot injury     partially disabled from  . Neuropathy of leg     Chronic; bilateral--thinks it was from working on hard floors and favoring right foot  . No pertinent past medical history   . Umbilical hernia 11/14/2010    Repaired 09/25/11    Past Surgical History  Procedure Laterality Date  . Foot surgery  2003-lt    plate in big toe; pin in 2nd toe due to 2 ton beam falling on foot (2 surgeries on foot)  . Foot arthroplasty  2003    lt foot x3  . Eye surgery  2005    rt   . Umbilical hernia repair  09/25/2011    Procedure: HERNIA  REPAIR UMBILICAL ADULT;  Surgeon: Currie Parishristian J Streck, MD;  Location: [facility];  Service: General;  Laterality: N/A;  umbilical hernia repair    Social History  Substance Use Topics  . Smoking status: Former Smoker    Quit date: 03/18/1990  . Smokeless tobacco: Never Used     Comment: quit after smoking x 3 years---in his 20's  . Alcohol Use: 0.0 oz/week    1-2 Cans of beer per week     Comment: 1-2 drinks at night on the weekend   Family History  Problem Relation Age of Onset  . Hypertension Father   . Hypertension Mother   . Diabetes Mother   . Heart disease Paternal Grandmother   . Heart disease Paternal Grandfather   . Cancer      uncle---unsure of what kind   Allergies  Allergen Reactions  . Penicillins     REACTION: rash   Current Outpatient Prescriptions on File Prior to Visit  Medication Sig Dispense Refill  . albuterol (PROVENTIL HFA;VENTOLIN HFA) 108 (90 BASE) MCG/ACT inhaler Inhale 2 puffs into the lungs every 4 (four) hours as needed for wheezing. 1 Inhaler  0  . amLODipine (NORVASC) 5 MG tablet Take 1 tablet (5 mg total) by mouth daily. 90 tablet 3  . Calcium Carbonate-Vitamin D (CALCIUM 600+D) 600-400 MG-UNIT per tablet Take 1 tablet by mouth daily.     . Chlorphen-PE-Acetaminophen (COLD MULTI-SYMPTOM PO) Take by mouth as needed.    . cholecalciferol (VITAMIN D) 400 UNITS TABS Take 400 Units by mouth daily.      . fluticasone (FLONASE) 50 MCG/ACT nasal spray Place 2 sprays into both nostrils daily. 16 g 6  . gabapentin (NEURONTIN) 300 MG capsule Take 300-600 mg by mouth 3 (three) times daily as needed.     . mometasone (ELOCON) 0.1 % cream APPLY TO AFFECTED AREA ONCE DAILY 15 g 0  . vitamin B-12 (CYANOCOBALAMIN) 500 MCG tablet Take 500 mcg by mouth 2 (two) times daily.      . vitamin E 400 UNIT capsule Take 400 Units by mouth daily.       No current facility-administered medications on file prior to visit.    Review of Systems Review of Systems   Constitutional: Negative for fever, appetite change, fatigue and unexpected weight change.  Eyes: Negative for pain and visual disturbance.  Respiratory: Negative for cough and shortness of breath.   Cardiovascular: Negative for cp or palpitations    Gastrointestinal: Negative for nausea, diarrhea and constipation. pos for loose stool that is more frequent, neg for blood in stool/dark stool or abd pain  Genitourinary: Negative for urgency and frequency.  Skin: Negative for pallor or rash   Neurological: Negative for weakness, light-headedness, numbness and headaches.  Hematological: Negative for adenopathy. Does not bruise/bleed easily.  Psychiatric/Behavioral: Negative for dysphoric mood. The patient is not nervous/anxious.         Objective:   Physical Exam  Constitutional: He appears well-developed and well-nourished. No distress.  Well appearing   HENT:  Head: Normocephalic and atraumatic.  Mouth/Throat: Oropharynx is clear and moist.  Eyes: Conjunctivae and EOM are normal. Pupils are equal, round, and reactive to light. No scleral icterus.  Neck: Normal range of motion. Neck supple.  Cardiovascular: Normal rate, regular rhythm and normal heart sounds.   Pulmonary/Chest: Effort normal and breath sounds normal. No respiratory distress. He has no wheezes. He has no rales.  Abdominal: Soft. Bowel sounds are normal. He exhibits no distension, no abdominal bruit and no mass. There is no hepatosplenomegaly. There is no tenderness. There is no rebound, no guarding, no CVA tenderness, no tenderness at McBurney's point and negative Murphy's sign.  Lymphadenopathy:    He has no cervical adenopathy.  Neurological: He is alert.  Skin: Skin is warm and dry. No erythema. No pallor.  Psychiatric: He has a normal mood and affect.          Assessment & Plan:   Problem List Items Addressed This Visit      Other   Diarrhea - Primary    More frequent stools that are looser than usual w/o  other GI symptoms or diet change  Disc poss of IBS and literature given (enc to try a fiber supplement)  Lab today  Also stool cx and c diff test  If all neg and no improvement - would consider ref to GI  Has not had colonoscopy yet due to age less than 50      Relevant Orders   CBC with Differential/Platelet (Completed)   Comprehensive metabolic panel (Completed)   TSH (Completed)   C. difficile GDH and Toxin A/B  Stool culture

## 2015-08-22 ENCOUNTER — Encounter: Payer: Self-pay | Admitting: *Deleted

## 2015-08-22 NOTE — Addendum Note (Signed)
Addended by: Baldomero LamyHAVERS, NATASHA C on: 08/22/2015 02:41 PM   Modules accepted: Orders

## 2015-10-22 ENCOUNTER — Encounter: Payer: Self-pay | Admitting: Family Medicine

## 2015-10-22 ENCOUNTER — Telehealth: Payer: Self-pay | Admitting: Family Medicine

## 2015-10-22 DIAGNOSIS — R739 Hyperglycemia, unspecified: Secondary | ICD-10-CM | POA: Insufficient documentation

## 2015-10-22 DIAGNOSIS — I10 Essential (primary) hypertension: Secondary | ICD-10-CM

## 2015-10-22 NOTE — Telephone Encounter (Signed)
-----   Message from Alvina Chouerri J Walsh sent at 10/20/2015  8:50 AM EDT ----- Regarding: Lab orders for Wednesday, 8.9.17 Lab orders for a f/u

## 2015-10-25 ENCOUNTER — Other Ambulatory Visit: Payer: Medicare Other

## 2015-11-02 ENCOUNTER — Other Ambulatory Visit (INDEPENDENT_AMBULATORY_CARE_PROVIDER_SITE_OTHER): Payer: Medicare Other

## 2015-11-02 DIAGNOSIS — R739 Hyperglycemia, unspecified: Secondary | ICD-10-CM

## 2015-11-02 DIAGNOSIS — R7989 Other specified abnormal findings of blood chemistry: Secondary | ICD-10-CM

## 2015-11-02 DIAGNOSIS — I10 Essential (primary) hypertension: Secondary | ICD-10-CM

## 2015-11-02 LAB — LIPID PANEL
CHOL/HDL RATIO: 6
Cholesterol: 210 mg/dL — ABNORMAL HIGH (ref 0–200)
HDL: 36.8 mg/dL — ABNORMAL LOW (ref >39.00)
NONHDL: 173.34
Triglycerides: 225 mg/dL — ABNORMAL HIGH (ref 0.0–149.0)
VLDL: 45 mg/dL — ABNORMAL HIGH (ref 0.0–40.0)

## 2015-11-02 LAB — COMPREHENSIVE METABOLIC PANEL
ALBUMIN: 4.4 g/dL (ref 3.5–5.2)
ALT: 24 U/L (ref 0–53)
AST: 20 U/L (ref 0–37)
Alkaline Phosphatase: 77 U/L (ref 39–117)
BUN: 14 mg/dL (ref 6–23)
CHLORIDE: 103 meq/L (ref 96–112)
CO2: 31 mEq/L (ref 19–32)
Calcium: 9.4 mg/dL (ref 8.4–10.5)
Creatinine, Ser: 1.53 mg/dL — ABNORMAL HIGH (ref 0.40–1.50)
GFR: 51.75 mL/min — ABNORMAL LOW (ref >60.00)
Glucose, Bld: 103 mg/dL — ABNORMAL HIGH (ref 70–99)
POTASSIUM: 4.3 meq/L (ref 3.5–5.1)
SODIUM: 139 meq/L (ref 135–145)
Total Bilirubin: 0.9 mg/dL (ref 0.2–1.2)
Total Protein: 6.9 g/dL (ref 6.0–8.3)

## 2015-11-02 LAB — LDL CHOLESTEROL, DIRECT: LDL DIRECT: 130 mg/dL

## 2015-11-02 LAB — HEMOGLOBIN A1C: HEMOGLOBIN A1C: 5.7 % (ref 4.6–6.5)

## 2015-11-06 ENCOUNTER — Encounter: Payer: Self-pay | Admitting: Family Medicine

## 2015-11-06 ENCOUNTER — Ambulatory Visit (INDEPENDENT_AMBULATORY_CARE_PROVIDER_SITE_OTHER): Payer: Medicare Other | Admitting: Family Medicine

## 2015-11-06 VITALS — BP 116/72 | HR 65 | Temp 98.1°F | Ht 68.0 in | Wt 187.2 lb

## 2015-11-06 DIAGNOSIS — E786 Lipoprotein deficiency: Secondary | ICD-10-CM | POA: Diagnosis not present

## 2015-11-06 DIAGNOSIS — R7989 Other specified abnormal findings of blood chemistry: Secondary | ICD-10-CM

## 2015-11-06 DIAGNOSIS — R739 Hyperglycemia, unspecified: Secondary | ICD-10-CM | POA: Diagnosis not present

## 2015-11-06 DIAGNOSIS — R748 Abnormal levels of other serum enzymes: Secondary | ICD-10-CM

## 2015-11-06 NOTE — Progress Notes (Signed)
Subjective:    Patient ID: George DeedsMichael D Fowler, male    DOB: 12/16/1965, 50 y.o.   MRN: [id number]  HPI Here for f/u of lab results  Wt Readings from Last 3 Encounters:  11/06/15 187 lb 4 oz (84.9 kg)  08/21/15 187 lb (84.8 kg)  04/17/15 190 lb 8 oz (86.4 kg)   bmi is 28.4    Chemistry      Component Value Date/Time   NA 139 11/02/2015 0824   K 4.3 11/02/2015 0824   CL 103 11/02/2015 0824   CO2 31 11/02/2015 0824   BUN 14 11/02/2015 0824   CREATININE 1.53 (H) 11/02/2015 0824      Component Value Date/Time   CALCIUM 9.4 11/02/2015 0824   ALKPHOS 77 11/02/2015 0824   AST 20 11/02/2015 0824   ALT 24 11/02/2015 0824   BILITOT 0.9 11/02/2015 0824    Has not drank enough water in general No urinary symptoms   Lab Results  Component Value Date   CHOL 210 (H) 11/02/2015   CHOL 192 10/19/2014   Lab Results  Component Value Date   HDL 36.80 (L) 11/02/2015   HDL 35.80 (L) 10/19/2014   No results found for: [facility] Lab Results  Component Value Date   TRIG 225.0 (H) 11/02/2015   TRIG 265.0 (H) 10/19/2014   Lab Results  Component Value Date   CHOLHDL 6 11/02/2015   CHOLHDL 5 10/19/2014   Lab Results  Component Value Date   LDLDIRECT 130.0 11/02/2015   LDLDIRECT 105.0 10/19/2014   HDL is always been low  Is exercising more  LDL is up a bit      Lab Results  Component Value Date   HGBA1C 5.7 11/02/2015  glucose is 29103  Mother- DM Father was dx with DM also   Diet is very good overall  He did go on vacation 2 weeks ago-ate badly- ate pizza and other unhealthy things   When not on vacation  Does not eat sweets  He does drink sweet tea (2-3 glasses a day)  He cannot tolerate unsweet tea  Bread is his weakness , but not pasta and rice and potatoes as a rule (except for macaroni salad)   He exercises regularly  Goes to the gym - as much as an hour per day Review of Systems Review of Systems  Constitutional: Negative for fever, appetite change, fatigue  and unexpected weight change.  Eyes: Negative for pain and visual disturbance.  Respiratory: Negative for cough and shortness of breath.   Cardiovascular: Negative for cp or palpitations    Gastrointestinal: Negative for nausea, diarrhea and constipation.  Genitourinary: Negative for urgency and frequency. no polyuria or dipsia  Skin: Negative for pallor or rash   Neurological: Negative for weakness, light-headedness, numbness and headaches.  Hematological: Negative for adenopathy. Does not bruise/bleed easily.  Psychiatric/Behavioral: Negative for dysphoric mood. The patient is not nervous/anxious.         Objective:   Physical Exam  Constitutional: He appears well-developed and well-nourished.  overwt and well appearing   HENT:  Head: Normocephalic and atraumatic.  Musculoskeletal: He exhibits no edema.  Psychiatric: He has a normal mood and affect.          Assessment & Plan:   Problem List Items Addressed This Visit      Other   Low HDL (under 40)    Disc goals for lipids and reasons to control them Rev labs with pt Rev low sat fat  diet in detail Is going to start more exercise/ should be helpful       Hyperglycemia - Primary    Pt has DM in both mother and father  No symptoms  Lab Results  Component Value Date   HGBA1C 5.7 11/02/2015   Adv to stop sweet tea/sugar drinks  Disc plan to dec carb servings also  Will re check 3-6 mo        Elevated serum creatinine    Pt thinks this is from dehydration/poor water intake Rev intake of fluids and goals for water to prevent dehydration  Re check planned  No urinary symptoms        Other Visit Diagnoses   None.

## 2015-11-06 NOTE — Assessment & Plan Note (Signed)
Pt thinks this is from dehydration/poor water intake Rev intake of fluids and goals for water to prevent dehydration  Re check planned  No urinary symptoms

## 2015-11-06 NOTE — Assessment & Plan Note (Signed)
Disc goals for lipids and reasons to control them Rev labs with pt Rev low sat fat diet in detail Is going to start more exercise/ should be helpful

## 2015-11-06 NOTE — Assessment & Plan Note (Addendum)
Pt has DM in both mother and father  Long discussion regarding origin of this problem- incl genetics and obesity as risk factors No symptoms  Lab Results  Component Value Date   HGBA1C 5.7 11/02/2015   Adv to stop sweet tea/sugar drinks  Disc plan to dec carb servings also  Will re check 3-6 mo

## 2015-11-06 NOTE — Patient Instructions (Addendum)
Stop sweet tea and drink water instead  Cut back carbs like macaroni salad and bread (also potato and rice and pasta)  Goal for exercise is 30 minutes or more a day 5 days per week  Pump up the water intake for kidney health (don't forget on vacation   Take care of yourself  I would like to re check labs for hyperglycemia and elevated creatinine in 3-6 months

## 2015-11-07 ENCOUNTER — Ambulatory Visit: Payer: Medicare Other | Admitting: Family Medicine

## 2016-02-16 ENCOUNTER — Ambulatory Visit: Payer: Medicare Other | Admitting: Family Medicine

## 2016-02-19 ENCOUNTER — Ambulatory Visit (INDEPENDENT_AMBULATORY_CARE_PROVIDER_SITE_OTHER): Payer: Medicare Other | Admitting: Family Medicine

## 2016-02-19 ENCOUNTER — Encounter: Payer: Self-pay | Admitting: Family Medicine

## 2016-02-19 VITALS — BP 122/70 | HR 75 | Temp 99.1°F | Ht 68.0 in | Wt 173.2 lb

## 2016-02-19 DIAGNOSIS — J209 Acute bronchitis, unspecified: Secondary | ICD-10-CM

## 2016-02-19 MED ORDER — GUAIFENESIN-CODEINE 100-10 MG/5ML PO SYRP: 5.0000 mL | ORAL_SOLUTION | Freq: Every evening | ORAL | 0 refills | Status: DC | PRN

## 2016-02-19 MED ORDER — AZITHROMYCIN 250 MG PO TABS: ORAL_TABLET | ORAL | 0 refills | Status: DC

## 2016-02-19 NOTE — Progress Notes (Signed)
Subjective:    Patient ID: George Fowler, male    DOB: 06-Sep-1965, 50 y.o.   MRN: [id number]  HPI Here with uri symptoms -not getting better , ? Sinus infection  10 days ago - ST and pnd  Followed by congestion  Hoarse voice Coughing a lot - productive green mucous   Did have sinus pain and pressure- that is improving   Had elevated temp at night -did not check it  99.1 now  Took tylenol this am   delysm - does help a little with cough   Patient Active Problem List   Diagnosis Date Noted  . Acute bronchitis 02/19/2016  . Elevated serum creatinine 11/06/2015  . Hyperglycemia 10/22/2015  . Diarrhea 08/21/2015  . Low HDL (under 40) 04/17/2015  . Essential hypertension, benign 07/15/2014  . Allergic rhinitis 02/28/2014  . Bee sting reaction 11/23/2013  . Eczema 11/20/2012  . 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 Toenail bruise 11/20/2012  . Fungal toenail infection 11/25/2011  . Diastasis recti 08/23/2011  . PERIPHERAL NEUROPATHY, FEET 02/22/2010   Past Medical History:  Diagnosis Date  . Foot injury    partially disabled from  . Neuropathy of leg    Chronic; bilateral--thinks it was from working on hard floors and favoring right foot  . No pertinent past medical history   . Seasonal allergic rhinitis    with cough  . Umbilical hernia 11/14/2010   Repaired 09/25/11    Past Surgical History:  Procedure Laterality Date  . EYE SURGERY  2005   rt   . FOOT ARTHROPLASTY  2003   lt foot x3  . FOOT SURGERY  2003-lt   plate in big toe; pin in 2nd toe due to 2 ton beam falling on foot (2 surgeries on foot)  . UMBILICAL HERNIA REPAIR  09/25/2011   Procedure: HERNIA REPAIR UMBILICAL ADULT;  Surgeon: Currie Parishristian J Streck, MD;  Location: [facility];  Service: General;  Laterality: N/A;  umbilical hernia repair    Social History  Substance Use Topics  . Smoking status: Former Smoker    Quit date: 03/18/1990  . Smokeless tobacco: Never Used   Comment: quit after smoking x 3 years---in his 20's  . Alcohol use 0.0 oz/week    1 - 2 Cans of beer per week     Comment: 1-2 drinks at night on the weekend   Family History  Problem Relation Age of Onset  . Hypertension Father   . Hypertension Mother   . Diabetes Mother   . Heart disease Paternal Grandmother   . Heart disease Paternal Grandfather   . Cancer      uncle---unsure of what kind   Allergies  Allergen Reactions  . Penicillins     REACTION: rash   Current Outpatient Prescriptions on File Prior to Visit  Medication Sig Dispense Refill  . albuterol (PROVENTIL HFA;VENTOLIN HFA) 108 (90 BASE) MCG/ACT inhaler Inhale 2 puffs into the lungs every 4 (four) hours as needed for wheezing. 1 Inhaler 0  . amLODipine (NORVASC) 5 MG tablet Take 1 tablet (5 mg total) by mouth daily. 90 tablet 3  . Calcium Carbonate-Vitamin D (CALCIUM 600+D) 600-400 MG-UNIT per tablet Take 1 tablet by mouth daily.     . Chlorphen-PE-Acetaminophen (COLD MULTI-SYMPTOM PO) Take by mouth as needed.    . cholecalciferol (VITAMIN D) 400 UNITS TABS Take 400 Units by mouth daily.      . fluticasone (FLONASE) 50 MCG/ACT nasal spray Place  2 sprays into both nostrils daily. 16 g 6  . gabapentin (NEURONTIN) 300 MG capsule Take 300-600 mg by mouth 3 (three) times daily as needed.     . mometasone (ELOCON) 0.1 % cream APPLY TO AFFECTED AREA ONCE DAILY 15 g 0  . vitamin B-12 (CYANOCOBALAMIN) 500 MCG tablet Take 500 mcg by mouth 2 (two) times daily.      . vitamin E 400 UNIT capsule Take 400 Units by mouth daily.       No current facility-administered medications on file prior to visit.       Review of Systems  Constitutional: Positive for appetite change, fatigue and fever.  HENT: Positive for congestion, postnasal drip, rhinorrhea, sinus pressure, sneezing and sore throat. Negative for ear pain.   Eyes: Negative for pain and discharge.  Respiratory: Positive for cough. Negative for shortness of breath, wheezing  and stridor.   Cardiovascular: Negative for chest pain.  Gastrointestinal: Negative for diarrhea, nausea and vomiting.  Genitourinary: Negative for frequency, hematuria and urgency.  Musculoskeletal: Negative for arthralgias and myalgias.  Skin: Negative for rash.  Neurological: Positive for headaches. Negative for dizziness, weakness and light-headedness.  Psychiatric/Behavioral: Negative for confusion and dysphoric mood.       Objective:   Physical Exam  Constitutional: He appears well-developed and well-nourished. No distress.  Well appearing   HENT:  Head: Normocephalic and atraumatic.  Right Ear: External ear normal.  Left Ear: External ear normal.  Mouth/Throat: Oropharynx is clear and moist.  Nares are injected and congested  No sinus tenderness Clear rhinorrhea and post nasal drip   Eyes: Conjunctivae and EOM are normal. Pupils are equal, round, and reactive to light. Right eye exhibits no discharge. Left eye exhibits no discharge.  Neck: Normal range of motion. Neck supple.  Cardiovascular: Normal rate and normal heart sounds.   Pulmonary/Chest: Effort normal and breath sounds normal. No respiratory distress. He has no wheezes. He has no rales. He exhibits no tenderness.  Harsh bs occ rhonchi No wheeze or stridor  Good air exch   Lymphadenopathy:    He has no cervical adenopathy.  Neurological: He is alert.  Skin: Skin is warm and dry. No rash noted.  Psychiatric: He has a normal mood and affect.          Assessment & Plan:   Problem List Items Addressed This Visit      Respiratory   Acute bronchitis    S/p uri day 10 Cover with zpak in light of length of illness Fluids/rest Disc symptomatic care - see instructions on AVS  Robitussin ac at bedtime for cough prn  Update if not starting to improve in a week or if worsening

## 2016-02-19 NOTE — Patient Instructions (Signed)
I think you have some early bronchitis Drink lots of fluids  Rest when you can  Take the zpak as directed  Robitussin AC cough syrup at night- caution of sedation   Update if not starting to improve in a week or if worsening

## 2016-02-19 NOTE — Assessment & Plan Note (Signed)
S/p uri day 10 Cover with zpak in light of length of illness Fluids/rest Disc symptomatic care - see instructions on AVS  Robitussin ac at bedtime for cough prn  Update if not starting to improve in a week or if worsening

## 2016-02-19 NOTE — Progress Notes (Signed)
Pre visit review using our clinic review tool, if applicable. No additional management support is needed unless otherwise documented below in the visit note. 

## 2016-02-26 ENCOUNTER — Other Ambulatory Visit: Payer: Medicare Other

## 2016-03-22 ENCOUNTER — Other Ambulatory Visit (INDEPENDENT_AMBULATORY_CARE_PROVIDER_SITE_OTHER): Payer: Medicare Other

## 2016-03-22 DIAGNOSIS — R739 Hyperglycemia, unspecified: Secondary | ICD-10-CM | POA: Diagnosis not present

## 2016-03-22 DIAGNOSIS — R7989 Other specified abnormal findings of blood chemistry: Secondary | ICD-10-CM

## 2016-03-22 LAB — BASIC METABOLIC PANEL
BUN: 13 mg/dL (ref 6–23)
CALCIUM: 8.8 mg/dL (ref 8.4–10.5)
CO2: 30 mEq/L (ref 19–32)
CREATININE: 1.17 mg/dL (ref 0.40–1.50)
Chloride: 108 mEq/L (ref 96–112)
GFR: 70.41 mL/min (ref >60.00)
Glucose, Bld: 99 mg/dL (ref 70–99)
POTASSIUM: 4 meq/L (ref 3.5–5.1)
Sodium: 142 mEq/L (ref 135–145)

## 2016-03-22 LAB — HEMOGLOBIN A1C: Hgb A1c MFr Bld: 5.7 % (ref 4.6–6.5)

## 2016-05-02 ENCOUNTER — Ambulatory Visit (INDEPENDENT_AMBULATORY_CARE_PROVIDER_SITE_OTHER): Payer: Medicare Other | Admitting: Internal Medicine

## 2016-05-02 ENCOUNTER — Encounter: Payer: Self-pay | Admitting: Internal Medicine

## 2016-05-02 ENCOUNTER — Encounter: Payer: Self-pay | Admitting: General Surgery

## 2016-05-02 VITALS — BP 130/90 | HR 68 | Resp 16 | Ht 68.0 in | Wt 182.0 lb

## 2016-05-02 DIAGNOSIS — S97102S Crushing injury of unspecified left toe(s), sequela: Secondary | ICD-10-CM

## 2016-05-02 DIAGNOSIS — I10 Essential (primary) hypertension: Secondary | ICD-10-CM | POA: Diagnosis not present

## 2016-05-02 DIAGNOSIS — M79672 Pain in left foot: Secondary | ICD-10-CM | POA: Diagnosis not present

## 2016-05-02 DIAGNOSIS — L723 Sebaceous cyst: Secondary | ICD-10-CM | POA: Diagnosis not present

## 2016-05-02 DIAGNOSIS — R7301 Impaired fasting glucose: Secondary | ICD-10-CM

## 2016-05-02 DIAGNOSIS — S9782XS Crushing injury of left foot, sequela: Secondary | ICD-10-CM

## 2016-05-02 NOTE — Progress Notes (Signed)
Pre visit review using our clinic review tool, if applicable. No additional management support is needed unless otherwise documented below in the visit note. 

## 2016-05-02 NOTE — Progress Notes (Signed)
Subjective:  Patient ID: George Fowler Fulwider, male    DOB: 25-Aug-1965  Age: 51 y.o. MRN: [id number] I CC: njury left foot 2 ton beam . I George Fowler Gabrielle presents for transition of care  He has a history of hypertension,   History of work related crush to left foot in 2003  When a 2 ton beam fell on it.  Had multiple surgeries including pins in first 2 toes .  Last surgery by  Dr Tillman Sersobert Anderson in [place]   Taking gabapentin 600 mg three times daily  No swelling   Now self employed,  Needs a good podiatrist  Every 3 years.   Diagnosed with HTN  2 years ago,  Uses amlodipine .  Does not check blood pressure away from here   Discussed relevance of a1c of 5.7   Left sided neck mass present for 2 years    Lab Results  Component Value Date   HGBA1C 5.7 03/22/2016    Lab Results  Component Value Date   CREATININE 1.17 03/22/2016    Outpatient Medications Prior to Visit  Medication Sig Dispense Refill  . albuterol (PROVENTIL HFA;VENTOLIN HFA) 108 (90 BASE) MCG/ACT inhaler Inhale 2 puffs into the lungs every 4 (four) hours as needed for wheezing. 1 Inhaler 0  . amLODipine (NORVASC) 5 MG tablet Take 1 tablet (5 mg total) by mouth daily. 90 tablet 3  . Calcium Carbonate-Vitamin Fowler (CALCIUM 600+Fowler) 600-400 MG-UNIT per tablet Take 1 tablet by mouth daily.     . Chlorphen-PE-Acetaminophen (COLD MULTI-SYMPTOM PO) Take by mouth as needed.    . cholecalciferol (VITAMIN Fowler) 400 UNITS TABS Take 400 Units by mouth daily.      . fluticasone (FLONASE) 50 MCG/ACT nasal spray Place 2 sprays into both nostrils daily. 16 g 6  . gabapentin (NEURONTIN) 300 MG capsule Take 300-600 mg by mouth 3 (three) times daily as needed.     Marland Kitchen. guaiFENesin-codeine (ROBITUSSIN AC) 100-10 MG/5ML syrup Take 5-10 mLs by mouth at bedtime as needed for cough. 120 mL 0  . mometasone (ELOCON) 0.1 % cream APPLY TO AFFECTED AREA ONCE DAILY 15 g 0  . vitamin B-12 (CYANOCOBALAMIN) 500 MCG tablet Take 500 mcg by mouth 2 (two)  times daily.      . vitamin E 400 UNIT capsule Take 400 Units by mouth daily.      Marland Kitchen. azithromycin (ZITHROMAX Z-PAK) 250 MG tablet Take 2 pills by mouth today and then 1 pill daily for 4 days 6 tablet 0   No facility-administered medications prior to visit.     Review of Systems;  Patient denies headache, fevers, malaise, unintentional weight loss, skin rash, eye pain, sinus congestion and sinus pain, sore throat, dysphagia,  hemoptysis , cough, dyspnea, wheezing, chest pain, palpitations, orthopnea, edema, abdominal pain, nausea, melena, diarrhea, constipation, flank pain, dysuria, hematuria, urinary  Frequency, nocturia, numbness, tingling, seizures,  Focal weakness, Loss of consciousness,  Tremor, insomnia, depression, anxiety, and suicidal ideation.      Objective:  BP 130/90   Pulse 68   Resp 16   Ht 5\' 8"  (1.727 m)   Wt 182 lb (82.6 kg)   SpO2 94%   BMI 27.67 kg/m   BP Readings from Last 3 Encounters:  05/02/16 130/90  02/19/16 122/70  11/06/15 116/72    Wt Readings from Last 3 Encounters:  05/02/16 182 lb (82.6 kg)  02/19/16 173 lb 4 oz (78.6 kg)  11/06/15 187 lb 4 oz (84.9 kg)  General appearance: alert, cooperative and appears stated age Ears: normal TM's and external ear canals both ears Throat: lips, mucosa, and tongue normal; teeth and gums normal Neck: no adenopathy, no carotid bruit, supple, symmetrical, trachea midline and thyroid not enlarged, symmetric, no tenderness/mass/nodules Back: symmetric, no curvature. ROM normal. No CVA tenderness. Lungs: clear to auscultation bilaterally Heart: regular rate and rhythm, S1, S2 normal, no murmur, click, rub or gallop Abdomen: soft, non-tender; bowel sounds normal; no masses,  no organomegaly Pulses: 2+ and symmetric Skin: Skin color, texture, turgor normal. No rashes or lesions Lymph nodes: Cervical, supraclavicular, and axillary nodes normal.  Lab Results  Component Value Date   HGBA1C 5.7 03/22/2016    HGBA1C 5.7 11/02/2015    Lab Results  Component Value Date   CREATININE 1.17 03/22/2016   CREATININE 1.53 (H) 11/02/2015   CREATININE 1.35 08/21/2015    Lab Results  Component Value Date   WBC 8.7 08/21/2015   HGB 15.1 08/21/2015   HCT 44.8 08/21/2015   PLT 182.0 08/21/2015   GLUCOSE 99 03/22/2016   CHOL 210 (H) 11/02/2015   TRIG 225.0 (H) 11/02/2015   HDL 36.80 (L) 11/02/2015   LDLDIRECT 130.0 11/02/2015   ALT 24 11/02/2015   AST 20 11/02/2015   NA 142 03/22/2016   K 4.0 03/22/2016   CL 108 03/22/2016   CREATININE 1.17 03/22/2016   BUN 13 03/22/2016   CO2 30 03/22/2016   TSH 2.52 08/21/2015   HGBA1C 5.7 03/22/2016    No results found.  Assessment & Plan:   Problem List Items Addressed This Visit    Crushing injury of foot with toe, left, sequela    He is requesting referral to podiatry for anticipation of future problems       Essential hypertension, benign    Well controlled on current regimen. Renal function stable, no changes today.  Lab Results  Component Value Date   CREATININE 1.17 03/22/2016   Lab Results  Component Value Date   NA 142 03/22/2016   K 4.0 03/22/2016   CL 108 03/22/2016   CO2 30 03/22/2016         Impaired fasting glucose    His random glucose is again elevated but not diagnostic of diabetes .  I recommend he follow a low glycemic index diet and particpate regularly in an aerobic  exercise activity.  We should check an A1c in 6 months.       Sebaceous cyst    Referral to dr byrnett to excision of neck mass      Relevant Orders   Ambulatory referral to General Surgery    Other Visit Diagnoses    Left foot pain    -  Primary   Relevant Orders   Ambulatory referral to Podiatry     A total of 25 minutes of face to face time was spent with patient more than half of which was spent in counselling about the above mentioned conditions  and coordination of care  I have discontinued Mr. Pollard azithromycin. I am also having  him maintain his Calcium Carbonate-Vitamin Fowler, gabapentin, vitamin B-12, cholecalciferol, vitamin E, fluticasone, mometasone, Chlorphen-PE-Acetaminophen (COLD MULTI-SYMPTOM PO), albuterol, amLODipine, and guaiFENesin-codeine.  No orders of the defined types were placed in this encounter.   Medications Discontinued During This Encounter  Medication Reason  . azithromycin (ZITHROMAX Z-PAK) 250 MG tablet Completed Course    Follow-up: Return in about 6 months (around 10/30/2016).   Sherlene Shams, MD

## 2016-05-02 NOTE — Patient Instructions (Addendum)
YOU ARE NOT A DIABETIC!  YOUR A1C  Is higher than normal so it suggests that you should cut back on the sugar in your diet and reume exercising  Sweet tea and granola bars are BIG SOURCES OF SUGAR  TRY THE KIND BARS THAT ARE 5 G SUGAR instead of granola bars    You should try [employer]'s Sinus rinse ;  It is a stong sinus "flush" using water and medicated salts.  Do it over the sink because it can be a bit messy.

## 2016-05-04 DIAGNOSIS — S9782XS Crushing injury of left foot, sequela: Secondary | ICD-10-CM

## 2016-05-04 DIAGNOSIS — R7301 Impaired fasting glucose: Secondary | ICD-10-CM | POA: Insufficient documentation

## 2016-05-04 DIAGNOSIS — S97102S Crushing injury of unspecified left toe(s), sequela: Secondary | ICD-10-CM | POA: Insufficient documentation

## 2016-05-04 DIAGNOSIS — L723 Sebaceous cyst: Secondary | ICD-10-CM | POA: Insufficient documentation

## 2016-05-04 NOTE — Assessment & Plan Note (Signed)
He is requesting referral to podiatry for anticipation of future problems

## 2016-05-04 NOTE — Assessment & Plan Note (Signed)
Referral to dr byrnett to excision of neck mass

## 2016-05-04 NOTE — Assessment & Plan Note (Signed)
Well controlled on current regimen. Renal function stable, no changes today.  Lab Results  Component Value Date   CREATININE 1.17 03/22/2016   Lab Results  Component Value Date   NA 142 03/22/2016   K 4.0 03/22/2016   CL 108 03/22/2016   CO2 30 03/22/2016

## 2016-05-04 NOTE — Assessment & Plan Note (Signed)
His random glucose is again elevated but not diagnostic of diabetes .  I recommend he follow a low glycemic index diet and particpate regularly in an aerobic  exercise activity.  We should check an A1c in 6 months.  

## 2016-05-16 ENCOUNTER — Other Ambulatory Visit: Payer: Self-pay | Admitting: Family Medicine

## 2016-05-20 ENCOUNTER — Other Ambulatory Visit: Payer: Self-pay | Admitting: *Deleted

## 2016-05-20 MED ORDER — AMLODIPINE BESYLATE 5 MG PO TABS: 5.0000 mg | ORAL_TABLET | Freq: Every day | ORAL | 3 refills | Status: DC

## 2016-05-21 ENCOUNTER — Encounter: Payer: Self-pay | Admitting: General Surgery

## 2016-05-21 ENCOUNTER — Ambulatory Visit (INDEPENDENT_AMBULATORY_CARE_PROVIDER_SITE_OTHER): Payer: Medicare Other | Admitting: General Surgery

## 2016-05-21 VITALS — BP 141/78 | HR 68 | Resp 16 | Ht 70.0 in | Wt 183.0 lb

## 2016-05-21 DIAGNOSIS — L723 Sebaceous cyst: Secondary | ICD-10-CM | POA: Diagnosis not present

## 2016-05-21 DIAGNOSIS — L720 Epidermal cyst: Secondary | ICD-10-CM | POA: Diagnosis not present

## 2016-05-21 DIAGNOSIS — IMO0002 Reserved for concepts with insufficient information to code with codable children: Secondary | ICD-10-CM

## 2016-05-21 NOTE — Progress Notes (Signed)
Patient ID: George Fowler, male   DOB: 06-28-1965, 51 y.o.   MRN: [id number]  Chief Complaint  Patient presents with  . Cyst    on neck    HPI George Fowler is a 51 y.o. male here today for an evaluation of a cyst located on his neck. Patient states it has been present for about 2 years. He states it started as a bump he popped and came back much larger. Has increased in size over the past two years. Patient states no pain or tenderness. Denies draining or fever. He states Dr. Darrick Huntsmanullo did notice some redness when he saw her on 05/02/16.   HPI  Past Medical History:  Diagnosis Date  . Foot injury    partially disabled from  . Neuropathy of leg    Chronic; bilateral--thinks it was from working on hard floors and favoring right foot  . No pertinent past medical history   . Seasonal allergic rhinitis    with cough  . Umbilical hernia 11/14/2010   Repaired 09/25/11     Past Surgical History:  Procedure Laterality Date  . EYE SURGERY  2005   rt   . FOOT ARTHROPLASTY  2003   lt foot x3  . FOOT SURGERY  2003-lt   plate in big toe; pin in 2nd toe due to 2 ton beam falling on foot (2 surgeries on foot)  . UMBILICAL HERNIA REPAIR  09/25/2011   Procedure: HERNIA REPAIR UMBILICAL ADULT;  Surgeon: Currie Parishristian J Streck, MD;  Location: [facility];  Service: General;  Laterality: N/A;  umbilical hernia repair    Family History  Problem Relation Age of Onset  . Hypertension Father   . Hypertension Mother   . Diabetes Mother   . Heart disease Paternal Grandmother   . Heart disease Paternal Grandfather   . Cancer      uncle---unsure of what kind    Social History Social History  Substance Use Topics  . Smoking status: Former Smoker    Quit date: 03/18/1990  . Smokeless tobacco: Never Used     Comment: quit after smoking x 3 years---in his 20's  . Alcohol use 0.0 oz/week    1 - 2 Cans of beer per week     Comment: 1-2 drinks at night on the weekend    Allergies   Allergen Reactions  . Penicillins     REACTION: rash    Current Outpatient Prescriptions  Medication Sig Dispense Refill  . amLODipine (NORVASC) 5 MG tablet Take 1 tablet (5 mg total) by mouth daily. 90 tablet 3  . Calcium Carbonate-Vitamin D (CALCIUM 600+D) 600-400 MG-UNIT per tablet Take 1 tablet by mouth daily.     . cholecalciferol (VITAMIN D) 400 UNITS TABS Take 400 Units by mouth daily.      Marland Kitchen. gabapentin (NEURONTIN) 300 MG capsule Take 300-600 mg by mouth 3 (three) times daily as needed.     . mometasone (ELOCON) 0.1 % cream APPLY TO AFFECTED AREA ONCE DAILY 15 g 0  . Omega-3 Fatty Acids (FISH OIL) 1200 MG CAPS Take by mouth.    . vitamin B-12 (CYANOCOBALAMIN) 500 MCG tablet Take 500 mcg by mouth 2 (two) times daily.      . vitamin E 400 UNIT capsule Take 400 Units by mouth daily.       No current facility-administered medications for this visit.     Review of Systems Review of Systems  Constitutional: Negative.   Respiratory: Negative.  Cardiovascular: Negative.     Blood pressure (!) 141/78, pulse 68, resp. rate 16, height 5\' 10"  (1.778 m), weight 183 lb (83 kg).  Physical Exam Physical Exam  Constitutional: He is oriented to person, place, and time. He appears well-developed and well-nourished.  Eyes: Conjunctivae are normal. No scleral icterus.  Neck:    Cardiovascular: Normal rate, regular rhythm and normal heart sounds.   Pulmonary/Chest: Effort normal and breath sounds normal.  Neurological: He is alert and oriented to person, place, and time.  Skin: Skin is warm and dry.  2 x 3 cm posterior left neck    Data Reviewed PCP notes.  Assessment    Sebaceous cyst left posterior neck.    Plan      Recommend excision of neck cyst days prior as the area is gradually increased in size and is frequently symptomatic with local discomfort.  The area was cleansed with alcohol followed by the injection of 10 mL of 0.5% Xylocaine with 0.25% Marcaine with  1-200,000 of epinephrine. This was supplemented with 3 mL of 1% plain Xylocaine. The area was cleansed with ChloraPrep and draped. Through a transversely oriented elliptical incision the central pore was excised as well as the entire cyst wall. Hemostasis required interrupted 3-0 Vicryl figure-of-eight sutures. The lesion extended down to the underlying superficial muscle fascia. No direct muscle involvement. The deep tissue was approximated with interrupted 3-0 Vicryl figure-of-eight sutures. The skin was approximated with interrupted 3-0 Vicryl septic sutures. Telfa and Tegaderm dressing applied.  Postoperative wound care reviewed. Nursing follow-up in one week.  This information has been scribed by Milas Kocher, CMA         [name] 05/22/2016, 11:08 AM

## 2016-05-21 NOTE — Patient Instructions (Signed)
Epidermal Cyst Removal, Care After Refer to this sheet in the next few weeks. These instructions provide you with information about caring for yourself after your procedure. Your health care provider may also give you more specific instructions. Your treatment has been planned according to current medical practices, but problems sometimes occur. Call your health care provider if you have any problems or questions after your procedure. What can I expect after the procedure? After the procedure, it is common to have:  Soreness in the area where your cyst was removed.  Tightness or itching from your skin sutures. Follow these instructions at home:  Take medicines only as directed by your health care provider.  If you were prescribed an antibiotic medicine, finish all of it even if you start to feel better.  Use antibiotic ointment as directed by your health care provider. Follow the instructions carefully.  There are many different ways to close and cover an incision, including stitches (sutures), skin glue, and adhesive strips. Follow your health care provider's instructions about:  Incision care.  Bandage (dressing) changes and removal.  Incision closure removal.  Keep the bandage (dressing) dry until your health care provider says that it can be removed. Take sponge baths only. Ask your health care provider when you can start showering or taking a bath.  After your dressing is off, check your incision every day for signs of infection. Watch for:  Redness, swelling, or pain.  Fluid, blood, or pus.  You can return to your normal activities. Do not do anything that stretches or puts pressure on your incision.  You can return to your normal diet.  Keep all follow-up visits as directed by your health care provider. This is important. Contact a health care provider if:  You have a fever.  Your incision bleeds.  You have redness, swelling, or pain in the incision area.  You have  fluid, blood, or pus coming from your incision.  Your cyst comes back after surgery. This information is not intended to replace advice given to you by your health care provider. Make sure you discuss any questions you have with your health care provider. Document Released: 03/25/2014 Document Revised: 08/10/2015 Document Reviewed: 11/17/2013 Elsevier Interactive Patient Education  2017 [employer]  

## 2016-05-22 DIAGNOSIS — IMO0002 Reserved for concepts with insufficient information to code with codable children: Secondary | ICD-10-CM | POA: Insufficient documentation

## 2016-05-24 ENCOUNTER — Telehealth: Payer: Self-pay | Admitting: *Deleted

## 2016-05-24 NOTE — Telephone Encounter (Signed)
-----   Message from Earline MayotteJeffrey W Byrnett, MD sent at 05/23/2016  7:44 PM EST ----- Please notify the patient that the pathology showed only an inflamed skin cyst. No other issues. Follow-up as scheduled. ----- Message ----- From: Interface, Lab In [facility] Sent: 05/23/2016   5:01 PM To: Earline MayotteJeffrey W Byrnett, MD

## 2016-05-24 NOTE — Telephone Encounter (Signed)
Notified patient as instructed, patient pleased. Discussed follow-up appointments, patient agrees  

## 2016-05-28 ENCOUNTER — Telehealth: Payer: Self-pay | Admitting: Internal Medicine

## 2016-05-28 ENCOUNTER — Ambulatory Visit (INDEPENDENT_AMBULATORY_CARE_PROVIDER_SITE_OTHER): Payer: Medicare Other | Admitting: *Deleted

## 2016-05-28 DIAGNOSIS — IMO0002 Reserved for concepts with insufficient information to code with codable children: Secondary | ICD-10-CM

## 2016-05-28 DIAGNOSIS — L723 Sebaceous cyst: Secondary | ICD-10-CM

## 2016-05-28 MED ORDER — GABAPENTIN 300 MG PO CAPS: 300.0000 mg | ORAL_CAPSULE | Freq: Three times a day (TID) | ORAL | 5 refills | Status: DC | PRN

## 2016-05-28 NOTE — Telephone Encounter (Signed)
Doesn't look like with medication has ever been filled here. Last Ov: 05/02/2016 Next Ov: 10/30/2016. Is it ok to refill?

## 2016-05-28 NOTE — Progress Notes (Signed)
Patient ID: George Fowler, male   DOB: 1965-09-09, 51 y.o.   MRN: [id number]   Patient came in today for a wound check.  The wound is clean, with no signs of infection noted. Follow up as needed.

## 2016-05-28 NOTE — Telephone Encounter (Signed)
refilled 

## 2016-05-28 NOTE — Patient Instructions (Signed)
The patient is aware to call back for any questions or concerns.  

## 2016-05-28 NOTE — Telephone Encounter (Signed)
Pt is requesting to have his gabapentin (NEURONTIN) 300 MG capsule refilled. Please advise pt.  Thanks

## 2016-05-29 NOTE — Telephone Encounter (Signed)
Pt notified rx was sent in.

## 2016-05-31 ENCOUNTER — Ambulatory Visit (INDEPENDENT_AMBULATORY_CARE_PROVIDER_SITE_OTHER): Payer: Medicare Other

## 2016-05-31 VITALS — BP 128/80 | HR 84 | Resp 14 | Ht 69.0 in | Wt 182.8 lb

## 2016-05-31 DIAGNOSIS — Z0000 Encounter for general adult medical examination without abnormal findings: Secondary | ICD-10-CM

## 2016-05-31 NOTE — Progress Notes (Signed)
Subjective:   George Fowler is a 51 y.o. male who presents for an Initial Medicare Annual Wellness Visit.  Review of Systems  No ROS.  Medicare Wellness Visit. Cardiac Risk Factors include: male gender;hypertension    Objective:    Today's Vitals   05/31/16 1415  BP: 128/80  Pulse: 84  Resp: 14  SpO2: 98%  Weight: 182 lb 12.8 oz (82.9 kg)  Height: 5\' 9"  (1.753 m)   Body mass index is 26.99 kg/m.  Current Medications (verified) Outpatient Encounter Prescriptions as of 05/31/2016  Medication Sig  . amLODipine (NORVASC) 5 MG tablet Take 1 tablet (5 mg total) by mouth daily.  . Calcium Carbonate-Vitamin D (CALCIUM 600+D) 600-400 MG-UNIT per tablet Take 1 tablet by mouth daily.   . cholecalciferol (VITAMIN D) 400 UNITS TABS Take 400 Units by mouth daily.    Marland Kitchen gabapentin (NEURONTIN) 300 MG capsule Take 1-2 capsules (300-600 mg total) by mouth 3 (three) times daily as needed.  . mometasone (ELOCON) 0.1 % cream APPLY TO AFFECTED AREA ONCE DAILY  . Omega-3 Fatty Acids (FISH OIL) 1200 MG CAPS Take by mouth.  . vitamin B-12 (CYANOCOBALAMIN) 500 MCG tablet Take 500 mcg by mouth 2 (two) times daily.    . vitamin E 400 UNIT capsule Take 400 Units by mouth daily.     No facility-administered encounter medications on file as of 05/31/2016.     Allergies (verified) Penicillins   History: Past Medical History:  Diagnosis Date  . Foot injury    partially disabled from  . Neuropathy of leg    Chronic; bilateral--thinks it was from working on hard floors and favoring right foot  . No pertinent past medical history   . Seasonal allergic rhinitis    with cough  . Umbilical hernia 11/14/2010   Repaired 09/25/11    Past Surgical History:  Procedure Laterality Date  . EYE SURGERY  2005   rt   . FOOT ARTHROPLASTY  2003   lt foot x3  . FOOT SURGERY  2003-lt   plate in big toe; pin in 2nd toe due to 2 ton beam falling on foot (2 surgeries on foot)  . UMBILICAL HERNIA REPAIR   09/25/2011   Procedure: HERNIA REPAIR UMBILICAL ADULT;  Surgeon: Currie Paris, MD;  Location: [facility];  Service: General;  Laterality: N/A;  umbilical hernia repair   Family History  Problem Relation Age of Onset  . Hypertension Father   . Hypertension Mother   . Diabetes Mother   . Heart disease Paternal Grandmother   . Heart disease Paternal Grandfather   . Cancer      uncle---unsure of what kind   Social History   Occupational History  . Self-employed    Social History Main Topics  . Smoking status: Former Smoker    Quit date: 03/18/1990  . Smokeless tobacco: Never Used     Comment: quit after smoking x 3 years---in his 20's  . Alcohol use 0.0 oz/week    1 - 2 Cans of beer per week     Comment: 1-2 drinks at night on the weekend  . Drug use: No  . Sexual activity: Not Currently   Tobacco Counseling Counseling given: Not Answered   Activities of Daily Living In your present state of health, do you have any difficulty performing the following activities: 05/31/2016  Hearing? N  Vision? N  Difficulty concentrating or making decisions? Y  Walking or climbing stairs? Jeannie Fend  Dressing or bathing? N  Doing errands, shopping? N  Preparing Food and eating ? N  Using the Toilet? N  In the past six months, have you accidently leaked urine? N  Do you have problems with loss of bowel control? N  Managing your Medications? N  Managing your Finances? N  Housekeeping or managing your Housekeeping? N  Some recent data might be hidden    Immunizations and Health Maintenance Immunization History  Administered Date(s) Administered  . Td 05/16/2001  . Tdap 05/27/2012   Health Maintenance Due  Topic Date Due  . HIV Screening  03/11/1982    Patient Care Team: Sherlene Shamseresa L Tullo, MD as PCP - General (Internal Medicine) Sherlene Shamseresa L Tullo, MD (Internal Medicine) Earline MayotteJeffrey W Byrnett, MD (General Surgery)  Indicate any recent Medical Services you may have received from  other than [facility] providers in the past year (date may be approximate).    Assessment:   This is a routine wellness examination for George NeedleMichael. The goal of the wellness visit is to assist the patient how to close the gaps in care and create a preventative care plan for the patient.   Taking calcium VIT D as appropriate/Osteoporosis risk reviewed.  Medications reviewed; taking without issues or barriers.  Safety issues reviewed; smoke detectors in the home.  Firearms locked up in the home. Wears seatbelts when driving or riding with others. Patient does wear sunscreen or protective clothing when in direct sunlight. No violence in the home.  Patient is alert, normal appearance, oriented to person/place/and time. Correctly identified the president of the [place], recall of 3/3 words, and performing simple calculations.  Patient displays appropriate judgement and can read correct time from watch face.  No new identified risk were noted.  No failures at ADL's or IADL's.   BMI- discussed the importance of a healthy diet, water intake and exercise. Educational material provided.   HTN- followed by PCP.  Dental- every six months.  Eye- Visual acuity not assessed per patient preference since they have regular follow up with the ophthalmologist.  Wears corrective lenses.  Sleep patterns- Sleeps 5-6 hours at night.  Wakes feeling rested.  HIV screening; discussed.    Patient Concerns: None at this time. Follow up with PCP as needed.  Hearing/Vision screen Hearing Screening Comments: Patient is able to hear conversational tones without difficulty.  No issues reported.  Vision Screening Comments: Visual acuity not assessed per patient preference since they have regular follow up with the ophthalmologist  Dietary issues and exercise activities discussed: Current Exercise Habits: Home exercise routine, Type of exercise: walking, Frequency (Times/Week): 3, Intensity: Mild  Goals    . Increase  physical activity          Exercise 5 days a week to reach weight below 150lb, 30-60 minutes, moderate pace     . Increase water intake          Stay hydrated and drink plenty of fluids/water      Depression Screen PHQ 2/9 Scores 05/31/2016 05/27/2012  PHQ - 2 Score 0 0    Fall Risk Fall Risk  05/31/2016  Falls in the past year? No    Cognitive Function: MMSE - Mini Mental State Exam 05/31/2016  Orientation to time 5  Orientation to Place 5  Registration 3  Attention/ Calculation 5  Recall 2  Recall-comments Recalled 2 out of 3 words  Language- name 2 objects 2  Language- repeat 1  Language- follow 3 step command 3  Language-  read & follow direction 1  Write a sentence 1  Copy design 1  Total score 29        Screening Tests Health Maintenance  Topic Date Due  . HIV Screening  03/11/1982  . INFLUENZA VACCINE  06/15/2016 (Originally 10/17/2015)  . TETANUS/TDAP  05/28/2022        Plan:    End of life planning; Advanced aging; Advanced directives discussed.  No HCPOA/Living Will.  Additional information provided to help them start the conversation with family.  Copy of HCPOA/Living Will requested upon completion. Time spent on this topic is 25 minutes.  Medicare Attestation I have personally reviewed: The patient's medical and social history Their use of alcohol, tobacco or illicit drugs Their current medications and supplements The patient's functional ability including ADLs,fall risks, home safety risks, cognitive, and hearing and visual impairment Diet and physical activities Evidence for depression   The patient's weight, height, BMI, and visual acuity have been recorded in the chart.  I have made referrals and provided education to the patient based on review of the above and I have provided the patient with a written personalized care plan for preventive services.    During the course of the visit Azari was educated and counseled about the following  appropriate screening and preventive services:   Vaccines to include Pneumoccal, Influenza, Hepatitis B, Td, Zostavax, HCV  Patient Instructions (the written plan) were given to the patient.   Ashok Pall, LPN   1/61/0960

## 2016-05-31 NOTE — Patient Instructions (Addendum)
  George Fowler , Thank you for taking time to come for your Medicare Wellness Visit. I appreciate your ongoing commitment to your health goals. Please review the following plan we discussed and let me know if I can assist you in the future.   Follow up with Dr. Darrick Huntsmanullo as needed.    Bring a copy of your Health Care Power of Attorney and/or Living Will to be scanned into chart.  Have a great day!  These are the goals we discussed: Goals    . Increase physical activity          Exercise 5 days a week to reach weight below 150lb, 30-60 minutes, moderate pace     . Increase water intake          Stay hydrated and drink plenty of fluids/water       This is a list of the screening recommended for you and due dates:  Health Maintenance  Topic Date Due  . HIV Screening  03/11/1982  . Flu Shot  06/15/2016*  . Tetanus Vaccine  05/28/2022  *Topic was postponed. The date shown is not the original due date.

## 2016-06-02 NOTE — Progress Notes (Signed)
  I have reviewed the above information and agree with above.   Anaika Santillano, MD 

## 2016-07-16 ENCOUNTER — Encounter: Payer: Self-pay | Admitting: Family Medicine

## 2016-07-16 ENCOUNTER — Ambulatory Visit (INDEPENDENT_AMBULATORY_CARE_PROVIDER_SITE_OTHER): Payer: Medicare Other | Admitting: Family Medicine

## 2016-07-16 DIAGNOSIS — J3089 Other allergic rhinitis: Secondary | ICD-10-CM | POA: Diagnosis not present

## 2016-07-16 MED ORDER — FLUTICASONE PROPIONATE 50 MCG/ACT NA SUSP: 2.0000 | Freq: Every day | NASAL | 6 refills | Status: DC

## 2016-07-16 NOTE — Progress Notes (Signed)
  Marikay Alar, MD Phone: [phone]  George Fowler is a 51 y.o. male who presents today for same-day visit.  Patient notes a week or so of nasal congestion, rhinorrhea, and postnasal drip. He is blowing clear mucus out of his nose. Some chest congestion with postnasal drip. Some cough of clear mucus. No shortness of breath. No fevers. No sneezing. He has similar symptoms yearly and has been treated for allergies in the past with good benefit. He has been taking a codeine cough syrup for his cough. Claritin and Benadryl as well. Claritin has not been very beneficial. In the past he has been on Flonase.  ROS see history of present illness  Objective  Physical Exam Vitals:   07/16/16 1004  BP: (!) 138/92  Pulse: 71  Temp: 98.1 F (36.7 C)    BP Readings from Last 3 Encounters:  07/16/16 (!) 138/92  05/31/16 128/80  05/21/16 (!) 141/78   Wt Readings from Last 3 Encounters:  07/16/16 186 lb 6.4 oz (84.6 kg)  05/31/16 182 lb 12.8 oz (82.9 kg)  05/21/16 183 lb (83 kg)    Physical Exam  Constitutional: No distress.  HENT:  Head: Normocephalic and atraumatic.  Mouth/Throat: Oropharynx is clear and moist. No oropharyngeal exudate.  Normal TMs  Eyes: Conjunctivae are normal. Pupils are equal, round, and reactive to light.  Cardiovascular: Normal rate, regular rhythm and normal heart sounds.   Pulmonary/Chest: Effort normal and breath sounds normal.  Skin: He is not diaphoretic.     Assessment/Plan: Please see individual problem list.  Allergic rhinitis Symptoms consistent with allergic rhinitis. We will treat with Flonase. He would like to switch to Zyrtec. He can continue the cough syrup as previously prescribed. Advised that this could make him drowsy so he should be wary of this. His symptoms are not improving he'll let us know.   No orders of the defined types were placed in this encounter.   Meds ordered this encounter  Medications  . guaiFENesin-codeine  (ROBITUSSIN AC) 100-10 MG/5ML syrup    Sig: Take 5 mLs by mouth 3 (three) times daily as needed for cough.  . fluticasone (FLONASE) 50 MCG/ACT nasal spray    Sig: Place 2 sprays into both nostrils daily.    Dispense:  16 g    Refill:  6   Marikay Alar, MD [facility]

## 2016-07-16 NOTE — Assessment & Plan Note (Signed)
Symptoms consistent with allergic rhinitis. We will treat with Flonase. He would like to switch to Zyrtec. He can continue the cough syrup as previously prescribed. Advised that this could make him drowsy so he should be wary of this. His symptoms are not improving he'll let us know.

## 2016-07-16 NOTE — Patient Instructions (Signed)
Nice to see you. Your symptoms are likely related to allergies. We'll start you on Flonase. You should continue Claritin. You can continue the cough medicine as needed. If your symptoms do not improve with this please let us know.

## 2016-07-16 NOTE — Progress Notes (Signed)
Pre visit review using our clinic review tool, if applicable. No additional management support is needed unless otherwise documented below in the visit note. 

## 2016-10-29 ENCOUNTER — Telehealth: Payer: Self-pay | Admitting: Internal Medicine

## 2016-10-29 NOTE — Telephone Encounter (Signed)
Patient Name: George Fowler  DOB: 1965-05-28    Initial Comment Caller got bit by mosquitos, he was weed eating, a lot had bit him, passed out and hit the floor of his building and has a knot on his head, headache. His speech isn't right.   Nurse Assessment  Nurse: Annye Englisharmon, RN, Denise Date/Time (Eastern Time): 10/29/2016 2:52:19 PM  Confirm and document reason for call. If symptomatic, describe symptoms. ---Caller got bit by mosquitos, he was weed eating, a lot had bit him, passed out and hit the floor of his building and has a knot on his head, headache. His speech isn't right. Caller states the pt speech has cleared up and he is no longer confused.  Does the patient have any new or worsening symptoms? ---Yes  Will a triage be completed? ---Yes  Related visit to physician within the last 2 weeks? ---No  Does the PT have any chronic conditions? (i.e. diabetes, asthma, etc.) ---No  Is this a behavioral health or substance abuse call? ---No     Guidelines    Guideline Title Affirmed Question Affirmed Notes  Head Injury [1] ACUTE NEURO SYMPTOM AND [2] now fine (DEFINITION: difficult to awaken OR confused thinking and talking OR slurred speech OR weakness of arms OR unsteady walking)    Final Disposition User   Go to ED Now (or PCP triage) Annye Englisharmon, RN, Angelique Blonderenise    Comments  RN phone auto disconnected the call for unknown reason.  Pt states he has a MD appt at 0800 with his PCP in the am.   Referrals  GO TO FACILITY REFUSED  GO TO FACILITY REFUSED   Disagree/Comply: Disagree  Disagree/Comply Reason: Wait and see

## 2016-10-29 NOTE — Telephone Encounter (Signed)
Spoke with the patient, he was doing yard work and believes he got bit by some mosquitos and the next thing he remembers was waking up on the floor of his bldg. He lives alone.  He hit the back of his head, there is a visible knot he said.  denies head ache or other injuries at this time.  Would prefer to just see Dr. Darrick Huntsmanullo tomorrow morning.  Already had a appt scheduled.  I advised patient that if he has any nausea and vomiting, any headache, any shortness of breath to go to the ED immediately tonight.  He agreed.  Will follow up in the am with PCP.

## 2016-10-29 NOTE — Telephone Encounter (Signed)
Attempted to reach the patient, left a VM to return my call.  

## 2016-10-30 ENCOUNTER — Ambulatory Visit (INDEPENDENT_AMBULATORY_CARE_PROVIDER_SITE_OTHER): Payer: Medicare Other | Admitting: Internal Medicine

## 2016-10-30 ENCOUNTER — Encounter: Payer: Self-pay | Admitting: Internal Medicine

## 2016-10-30 ENCOUNTER — Ambulatory Visit
Admission: RE | Admit: 2016-10-30 | Discharge: 2016-10-30 | Disposition: A | Discharge status: Home / Self Care | Payer: Medicare Other | Source: Ambulatory Visit | Attending: Internal Medicine | Admitting: Internal Medicine

## 2016-10-30 VITALS — BP 162/90 | HR 82 | Temp 98.1°F | Resp 15 | Ht 69.0 in | Wt 182.8 lb

## 2016-10-30 DIAGNOSIS — S060X9A Concussion with loss of consciousness of unspecified duration, initial encounter: Secondary | ICD-10-CM | POA: Diagnosis not present

## 2016-10-30 DIAGNOSIS — R7301 Impaired fasting glucose: Secondary | ICD-10-CM

## 2016-10-30 DIAGNOSIS — E785 Hyperlipidemia, unspecified: Secondary | ICD-10-CM

## 2016-10-30 DIAGNOSIS — L080 Pyoderma: Secondary | ICD-10-CM

## 2016-10-30 DIAGNOSIS — S060X1A Concussion with loss of consciousness of 30 minutes or less, initial encounter: Secondary | ICD-10-CM | POA: Insufficient documentation

## 2016-10-30 DIAGNOSIS — R4020 Unspecified coma: Secondary | ICD-10-CM | POA: Diagnosis not present

## 2016-10-30 DIAGNOSIS — R51 Headache: Secondary | ICD-10-CM | POA: Diagnosis not present

## 2016-10-30 DIAGNOSIS — I10 Essential (primary) hypertension: Secondary | ICD-10-CM

## 2016-10-30 DIAGNOSIS — R55 Syncope and collapse: Secondary | ICD-10-CM | POA: Diagnosis not present

## 2016-10-30 DIAGNOSIS — W19XXXA Unspecified fall, initial encounter: Secondary | ICD-10-CM | POA: Insufficient documentation

## 2016-10-30 LAB — CBC WITH DIFFERENTIAL/PLATELET
BASOS ABS: 0 10*3/uL (ref 0.0–0.1)
BASOS PCT: 0.2 % (ref 0.0–3.0)
EOS PCT: 2.1 % (ref 0.0–5.0)
Eosinophils Absolute: 0.2 10*3/uL (ref 0.0–0.7)
HEMATOCRIT: 46.1 % (ref 39.0–52.0)
Hemoglobin: 15.3 g/dL (ref 13.0–17.0)
LYMPHS PCT: 16.1 % (ref 12.0–46.0)
Lymphs Abs: 1.6 10*3/uL (ref 0.7–4.0)
MCHC: 33.1 g/dL (ref 30.0–36.0)
MCV: 96.5 fl (ref 78.0–100.0)
MONOS PCT: 7.9 % (ref 3.0–12.0)
Monocytes Absolute: 0.8 10*3/uL (ref 0.1–1.0)
NEUTROS ABS: 7.1 10*3/uL (ref 1.4–7.7)
Neutrophils Relative %: 73.7 % (ref 43.0–77.0)
PLATELETS: 187 10*3/uL (ref 150.0–400.0)
RBC: 4.78 Mil/uL (ref 4.22–5.81)
RDW: 12.5 % (ref 11.5–15.5)
WBC: 9.6 10*3/uL (ref 4.0–10.5)

## 2016-10-30 LAB — COMPREHENSIVE METABOLIC PANEL
ALT: 27 U/L (ref 0–53)
AST: 27 U/L (ref 0–37)
Albumin: 4.4 g/dL (ref 3.5–5.2)
Alkaline Phosphatase: 69 U/L (ref 39–117)
BUN: 19 mg/dL (ref 6–23)
CHLORIDE: 105 meq/L (ref 96–112)
CO2: 29 meq/L (ref 19–32)
Calcium: 9.1 mg/dL (ref 8.4–10.5)
Creatinine, Ser: 1.32 mg/dL (ref 0.40–1.50)
GFR: 61.11 mL/min (ref >60.00)
GLUCOSE: 114 mg/dL — AB (ref 70–99)
POTASSIUM: 4 meq/L (ref 3.5–5.1)
SODIUM: 140 meq/L (ref 135–145)
TOTAL PROTEIN: 6.3 g/dL (ref 6.0–8.3)
Total Bilirubin: 0.8 mg/dL (ref 0.2–1.2)

## 2016-10-30 MED ORDER — PREDNISONE 10 MG PO TABS: ORAL_TABLET | ORAL | 0 refills | Status: DC

## 2016-10-30 MED ORDER — DOXYCYCLINE HYCLATE 100 MG PO TABS: 100.0000 mg | ORAL_TABLET | Freq: Two times a day (BID) | ORAL | 0 refills | Status: DC

## 2016-10-30 MED ORDER — GABAPENTIN 300 MG PO CAPS: 900.0000 mg | ORAL_CAPSULE | Freq: Three times a day (TID) | ORAL | 5 refills | Status: DC | PRN

## 2016-10-30 NOTE — Progress Notes (Signed)
Subjective:  Patient ID: George Fowler, male    DOB: 16-Nov-1965  Age: 51 y.o. MRN: [id number]  CC: The primary encounter diagnosis was Loss of consciousness (HCC). Diagnoses of Essential hypertension, benign, Concussion with less than 1 hour loss of consciousness, Pustular rash, Impaired fasting glucose, and Dyslipidemia (high LDL; low HDL) were also pertinent to this visit.  HPI George Fowler presents for 6 month follow up on hypertension dyslipidemia and IPG and management of acute symptoms. .  Diffuse pustular rash started yesterday on arms and legs, less than an hour after doing some weed eating in his yard.  Denies direct contact with plants.  No obvious insect bites. Suddenly developed intense itching over his entire body ,  Without shortness of breath, trouble swallowing or thick tongue.  Within 30 minutes of onset of itching , he developed tunnel vision while putting away the lawn mower and tools in his garage.  He became dizzy and presyncopal so he tried to sit down to avoid falling but fell backward and hit his head on concrete floor and lost consciousness for an unclear period of time.  During his LOC he lost control of bowels and passes a solid stool.  He woke up fully oriented, with a headache.  Did not go to ER.  Went inside and tool 50 mg benadryl, which he he repeated before he went to bed  Used hydrocortisone on the papular rash hat is now covering arms and upper thighs.    Continues to have a  mild headache right occipital area,  But denies visual changes ,  dizziness.  Overnight the rash has become pustular and he notes diffuse redness and swelling of arms.     Outpatient Medications Prior to Visit  Medication Sig Dispense Refill  . amLODipine (NORVASC) 5 MG tablet Take 1 tablet (5 mg total) by mouth daily. 90 tablet 3  . Calcium Carbonate-Vitamin D (CALCIUM 600+D) 600-400 MG-UNIT per tablet Take 1 tablet by mouth daily.     . mometasone (ELOCON) 0.1 % cream APPLY TO  AFFECTED AREA ONCE DAILY 15 g 0  . Omega-3 Fatty Acids (FISH OIL) 1200 MG CAPS Take by mouth.    . vitamin B-12 (CYANOCOBALAMIN) 500 MCG tablet Take 500 mcg by mouth 2 (two) times daily.      . vitamin E 400 UNIT capsule Take 400 Units by mouth daily.      Marland Kitchen gabapentin (NEURONTIN) 300 MG capsule Take 1-2 capsules (300-600 mg total) by mouth 3 (three) times daily as needed. 180 capsule 5  . cholecalciferol (VITAMIN D) 400 UNITS TABS Take 400 Units by mouth daily.      . fluticasone (FLONASE) 50 MCG/ACT nasal spray Place 2 sprays into both nostrils daily. (Patient not taking: Reported on 10/30/2016) 16 g 6  . guaiFENesin-codeine (ROBITUSSIN AC) 100-10 MG/5ML syrup Take 5 mLs by mouth 3 (three) times daily as needed for cough.     No facility-administered medications prior to visit.     Review of Systems;  Patient denies  fevers, malaise, unintentional weight loss, , eye pain, sinus congestion and sinus pain, sore throat, dysphagia,  hemoptysis , cough, dyspnea, wheezing, chest pain, palpitations, orthopnea, edema, abdominal pain, nausea, melena, diarrhea, constipation, flank pain, dysuria, hematuria, urinary  Frequency, nocturia, numbness, tingling, seizures,  Focal weakness, Loss of consciousness,  Tremor, insomnia, depression, anxiety, and suicidal ideation.      Objective:  BP (!) 162/90 (BP Location: Left Arm, Patient Position: Sitting, Cuff Size:  Normal)   Pulse 82   Temp 98.1 F (36.7 C) (Oral)   Resp 15   Ht 5\' 9"  (1.753 m)   Wt 182 lb 12.8 oz (82.9 kg)   SpO2 96%   BMI 26.99 kg/m   BP Readings from Last 3 Encounters:  10/30/16 (!) 162/90  07/16/16 (!) 138/92  05/31/16 128/80    Wt Readings from Last 3 Encounters:  10/30/16 182 lb 12.8 oz (82.9 kg)  07/16/16 186 lb 6.4 oz (84.6 kg)  05/31/16 182 lb 12.8 oz (82.9 kg)    General appearance: alert, cooperative and appears stated age Neck: no adenopathy, no carotid bruit, supple, symmetrical, trachea midline and thyroid not  enlarged, symmetric, no tenderness/mass/nodules Back: symmetric, no curvature. ROM normal. No CVA tenderness. Lungs: clear to auscultation bilaterally Heart: regular rate and rhythm, S1, S2 normal, no murmur, click, rub or gallop Abdomen: soft, non-tender; bowel sounds normal; no masses,  no organomegaly Pulses: 2+ and symmetric Skin: diffuse erythema of both arms,..  diffuse pustular rash covering arms and thighs.  Lymph nodes: Cervical, supraclavicular, and axillary nodes normal. Neuro:  awake and interactive with normal mood and affect. Higher cortical functions are normal. Speech is clear without word-finding difficulty or dysarthria. Extraocular movements are intact. Visual fields of both eyes are grossly intact. Sensation to light touch is grossly intact bilaterally of upper and lower extremities. Motor examination shows 4+/5 symmetric hand grip and upper extremity and 5/5 lower extremity strength. There is no pronation or drift. Gait is non-ataxic    Lab Results  Component Value Date   HGBA1C 5.7 03/22/2016   HGBA1C 5.7 11/02/2015    Lab Results  Component Value Date   CREATININE 1.32 10/30/2016   CREATININE 1.17 03/22/2016   CREATININE 1.53 (H) 11/02/2015    Lab Results  Component Value Date   WBC 9.6 10/30/2016   HGB 15.3 10/30/2016   HCT 46.1 10/30/2016   PLT 187.0 10/30/2016   GLUCOSE 114 (H) 10/30/2016   CHOL 210 (H) 11/02/2015   TRIG 225.0 (H) 11/02/2015   HDL 36.80 (L) 11/02/2015   LDLDIRECT 130.0 11/02/2015   ALT 27 10/30/2016   AST 27 10/30/2016   NA 140 10/30/2016   K 4.0 10/30/2016   CL 105 10/30/2016   CREATININE 1.32 10/30/2016   BUN 19 10/30/2016   CO2 29 10/30/2016   TSH 2.52 08/21/2015   HGBA1C 5.7 03/22/2016    No results found.  Assessment & Plan:   Problem List Items Addressed This Visit    Pustular rash    Unclear cause (insect vs plant mediated allergic reaction) complicated by bacterial cellulitis.  He has a PCN allergy.  Will use  doxycycline and prednisone taper.  Lab Results  Component Value Date   WBC 9.6 10/30/2016   HGB 15.3 10/30/2016   HCT 46.1 10/30/2016   MCV 96.5 10/30/2016   PLT 187.0 10/30/2016         Essential hypertension, benign    Elevated today due to acute illness.  No changes today, rtc one week   Lab Results  Component Value Date   CREATININE 1.32 10/30/2016   Lab Results  Component Value Date   NA 140 10/30/2016   K 4.0 10/30/2016   CL 105 10/30/2016   CO2 29 10/30/2016         Concussion with less than 1 hour loss of consciousness    Secondary to fall with blunt head trauma. CT of head done to rule out SDH  given LOC and loss of bowel function was negative for acute changes but noted generalized atrophy .  Brain rest advised for 1-2 weeks        Other Visit Diagnoses    Loss of consciousness (HCC)    -  Primary   Relevant Orders   Comprehensive metabolic panel (Completed)   CBC with Differential/Platelet (Completed)   CT Head Wo Contrast (Completed)   Impaired fasting glucose       Relevant Orders   Hemoglobin A1c   Dyslipidemia (high LDL; low HDL)       Relevant Orders   Lipid panel    A total of 40 minutes was spent with patient more than half of which was spent in examining and counseling patient on the above mentioned issues , reviewing and explaining prior  labs and imaging studies done, and coordination of care.   I have discontinued Mr. Suder cholecalciferol, guaiFENesin-codeine, and fluticasone. I have also changed his gabapentin. Additionally, I am having him start on doxycycline and predniSONE. Lastly, I am having him maintain his Calcium Carbonate-Vitamin D, vitamin B-12, vitamin E, mometasone, amLODipine, and Fish Oil.  Meds ordered this encounter  Medications  . doxycycline (VIBRA-TABS) 100 MG tablet    Sig: Take 1 tablet (100 mg total) by mouth 2 (two) times daily.    Dispense:  14 tablet    Refill:  0  . predniSONE (DELTASONE) 10 MG tablet     Sig: 6 tablets on Day 1 , then reduce by 1 tablet daily until gone    Dispense:  21 tablet    Refill:  0  . gabapentin (NEURONTIN) 300 MG capsule    Sig: Take 3 capsules (900 mg total) by mouth 3 (three) times daily as needed.    Dispense:  270 capsule    Refill:  5    Medications Discontinued During This Encounter  Medication Reason  . cholecalciferol (VITAMIN D) 400 UNITS TABS Patient has not taken in last 30 days  . fluticasone (FLONASE) 50 MCG/ACT nasal spray Patient has not taken in last 30 days  . guaiFENesin-codeine (ROBITUSSIN AC) 100-10 MG/5ML syrup Patient has not taken in last 30 days  . gabapentin (NEURONTIN) 300 MG capsule Reorder    Follow-up: No Follow-up on file.   Sherlene Shams, MD

## 2016-10-30 NOTE — Patient Instructions (Addendum)
CT  Head has been ordered because you  suffered  A CONCUSSION .  yoU NEED TO REST YOUR BRAIN FOR THE NEXT 1-2 WEEKS.. NO IPAD,  LIMIT YOUR READING.  OK TO WATCH TV AND LISTEN TO MUSIC .  NO ALCOHOL.    For your allergies ,  Choose a  newer second generation antihistamines that are longer acting, non sedating and  available OTC an dtake it every day   Generic  Zyrtec, which is cetirizine.    generic Allegra , available generically as fexofenadine ; comes in 60 mg and 180 mg once daily strengths.     Prednisone taper and antibiotic  to take care of the skin rash and the pustules that suggest you have become infected from surface bacteria    Concussion, Adult A concussion is a brain injury from a direct hit (blow) to the head or body. This injury causes the brain to shake quickly back and forth inside the skull. It is caused by:  A hit to the head.  A quick and sudden movement (jolt) of the head or neck.  How fast you will get better from a concussion depends on many things like how bad your concussion was, what part of your brain was hurt, how old you are, and how healthy you were before the concussion. Recovery can take time. It is important to wait to return to activity until a doctor says it is safe and your symptoms are all gone. Follow these instructions at home: Activity  Limit activities that need a lot of thought or concentration. These include: ? Homework or work for your job. ? Watching TV. ? Computer work. ? Playing memory games and puzzles.  Rest. Rest helps the brain to heal. Make sure you: ? Get plenty of sleep at night. Do not stay up late. ? Go to bed at the same time every day. ? Rest during the day. Take naps or rest breaks when you feel tired.  It can be dangerous if you get another concussion before the first one has healed Do not do activities that could cause a second concussion, such as riding a bike or playing sports.  Ask your doctor when you can return  to your normal activities, like driving, riding a bike, or using machinery. Your ability to react may be slower. Do not do these activities if you are dizzy. Your doctor will likely give you a plan for slowly going back to activities. General instructions  Take over-the-counter and prescription medicines only as told by your doctor.  Do not drink alcohol until your doctor says you can.  If it is harder than usual to remember things, write them down.  If you are easily distracted, try to do one thing at a time. For example, do not try to watch TV while making dinner.  Talk with family members or close friends when you need to make important decisions.  Watch your symptoms and tell other people to do the same. Other problems (complications) can happen after a concussion. Older adults with a brain injury may have a higher risk of serious problems, such as a blood clot in the brain.  Tell your teachers, school nurse, school counselor, coach, Event organiserathletic trainer, or work Production designer, theatre/television/filmmanager about your injury and symptoms. Tell them about what you can or cannot do. They should watch for: ? More problems with attention or concentration. ? More trouble remembering or learning new information. ? More time needed to do tasks or assignments. ?  Being more annoyed (irritable) or having a harder time dealing with stress. ? Any other symptoms that get worse.  Keep all follow-up visits as told by your health care provider. This is important. Prevention  It is very important that you donot get another brain injury, especially before you have healed. In rare cases, another injury can cause permanent brain damage, brain swelling, or death. You have the most risk if you get another head injury in the first 7-10 days after you were hurt before. To avoid injuries: ? Wear a seat belt when you ride in a car. ? Do not drink too much alcohol. ? Avoid activities that could make you get a second concussion, like contact  sports. ? Wear a helmet when you do activities like:  Biking.  Skiing.  Skateboarding.  Skating. ? Make your home safe by:  Removing things from the floor or stairs that could make you trip.  Using grab bars in bathrooms and handrails by stairs.  Placing non-slip mats on floors and in bathtubs.  Putting more light in dark areas. Contact a doctor if:  Your symptoms get worse.  You have new symptoms.  You keep having symptoms for more than 2 weeks. Get help right away if:  You have bad headaches, or your headaches get worse.  You have weakness in any part of your body.  You have loss of feeling (numbness).  You feel off balance.  You keep throwing up (vomiting).  You feel more sleepy.  The black center of one eye (pupil) is bigger than the other one.  You twitch or shake violently (convulse) or have a seizure.  Your speech is not clear (is slurred).  You feel more tired, more confused, or more annoyed.  You do not recognize people or places.  You have neck pain.  It is hard to wake you up.  You have strange behavior changes.  You pass out (lose consciousness). Summary  A concussion is a brain injury from a direct hit (blow) to the head or body.  This condition is treated with rest and careful watching of symptoms.  If you keep having symptoms for more than 2 weeks, call your doctor. This information is not intended to replace advice given to you by your health care provider. Make sure you discuss any questions you have with your health care provider. Document Released: 02/20/2009 Document Revised: 02/17/2016 Document Reviewed: 02/17/2016 Elsevier Interactive Patient Education  2017 [employer].

## 2016-10-31 DIAGNOSIS — Z87820 Personal history of traumatic brain injury: Secondary | ICD-10-CM | POA: Insufficient documentation

## 2016-10-31 DIAGNOSIS — L080 Pyoderma: Secondary | ICD-10-CM | POA: Insufficient documentation

## 2016-10-31 DIAGNOSIS — S060X9A Concussion with loss of consciousness of unspecified duration, initial encounter: Secondary | ICD-10-CM | POA: Insufficient documentation

## 2016-10-31 NOTE — Assessment & Plan Note (Signed)
Unclear cause (insect vs plant mediated allergic reaction) complicated by bacterial cellulitis.  He has a PCN allergy.  Will use doxycycline and prednisone taper.  Lab Results  Component Value Date   WBC 9.6 10/30/2016   HGB 15.3 10/30/2016   HCT 46.1 10/30/2016   MCV 96.5 10/30/2016   PLT 187.0 10/30/2016

## 2016-10-31 NOTE — Assessment & Plan Note (Addendum)
Elevated today due to acute illness.  No changes today, rtc one week   Lab Results  Component Value Date   CREATININE 1.32 10/30/2016   Lab Results  Component Value Date   NA 140 10/30/2016   K 4.0 10/30/2016   CL 105 10/30/2016   CO2 29 10/30/2016

## 2016-10-31 NOTE — Assessment & Plan Note (Signed)
Secondary to fall with blunt head trauma. CT of head done to rule out SDH given LOC and loss of bowel function was negative for acute changes but noted generalized atrophy .  Brain rest advised for 1-2 weeks

## 2016-12-02 ENCOUNTER — Ambulatory Visit (INDEPENDENT_AMBULATORY_CARE_PROVIDER_SITE_OTHER): Payer: Medicare Other | Admitting: Internal Medicine

## 2016-12-02 ENCOUNTER — Encounter: Payer: Self-pay | Admitting: Internal Medicine

## 2016-12-02 VITALS — BP 130/82 | HR 62 | Temp 98.5°F | Resp 15 | Ht 69.0 in | Wt 184.6 lb

## 2016-12-02 DIAGNOSIS — E785 Hyperlipidemia, unspecified: Secondary | ICD-10-CM | POA: Diagnosis not present

## 2016-12-02 DIAGNOSIS — I6930 Unspecified sequelae of cerebral infarction: Secondary | ICD-10-CM

## 2016-12-02 DIAGNOSIS — Z79899 Other long term (current) drug therapy: Secondary | ICD-10-CM

## 2016-12-02 DIAGNOSIS — Z87898 Personal history of other specified conditions: Secondary | ICD-10-CM | POA: Diagnosis not present

## 2016-12-02 DIAGNOSIS — I2510 Atherosclerotic heart disease of native coronary artery without angina pectoris: Secondary | ICD-10-CM | POA: Diagnosis not present

## 2016-12-02 DIAGNOSIS — R7301 Impaired fasting glucose: Secondary | ICD-10-CM | POA: Diagnosis not present

## 2016-12-02 DIAGNOSIS — S060X9A Concussion with loss of consciousness of unspecified duration, initial encounter: Secondary | ICD-10-CM | POA: Diagnosis not present

## 2016-12-02 DIAGNOSIS — E663 Overweight: Secondary | ICD-10-CM

## 2016-12-02 DIAGNOSIS — I69398 Other sequelae of cerebral infarction: Secondary | ICD-10-CM | POA: Diagnosis not present

## 2016-12-02 DIAGNOSIS — I10 Essential (primary) hypertension: Secondary | ICD-10-CM | POA: Diagnosis not present

## 2016-12-02 LAB — LIPID PANEL
CHOL/HDL RATIO: 5
CHOLESTEROL: 223 mg/dL — AB (ref 0–200)
HDL: 48.3 mg/dL (ref >39.00)
NonHDL: 174.99
Triglycerides: 248 mg/dL — ABNORMAL HIGH (ref 0.0–149.0)
VLDL: 49.6 mg/dL — AB (ref 0.0–40.0)

## 2016-12-02 LAB — LDL CHOLESTEROL, DIRECT: LDL DIRECT: 135 mg/dL

## 2016-12-02 LAB — HEMOGLOBIN A1C: Hgb A1c MFr Bld: 5.8 % (ref 4.6–6.5)

## 2016-12-02 MED ORDER — ATORVASTATIN CALCIUM 20 MG PO TABS: 20.0000 mg | ORAL_TABLET | Freq: Every day | ORAL | 2 refills | Status: DC

## 2016-12-02 NOTE — Patient Instructions (Addendum)
I want to lower your blood pressure even more to 120/70 to prevent any more strokes  Increase AMLODIPINE DOSE TO 10 MG DAILY   CHECK BP IN ONE WEEK AND LET ME KNOW THE READING    I am also recommending that you  Start taking generic Lipitor to stabilize the placque seen in your the blood vessels in your brain    Return in 4 weeks for non fasting labs to check your liver enzymes   Baseline cholesterol today along with A1c   continue to take an antihistamine on the days you work outside  .  generic zyrtec, which is cetirizine.  Allegra is available generically as fexofenadine and it comes in 60 mg and 180 mg once daily strengths. The claritin is also available generically as loratidine .

## 2016-12-02 NOTE — Progress Notes (Signed)
Subjective:  Patient ID: George Fowler, male    DOB: 11-12-1965  Age: 51 y.o. MRN: [id number]  CC: The primary encounter diagnosis was Impaired fasting glucose. Diagnoses of Atherosclerosis of coronary artery of native heart without angina pectoris, unspecified vessel or lesion type, Sequela of lacunar infarction, Concussion with less than 1 hour loss of consciousness, Essential hypertension, benign, Hyperlipidemia LDL goal <100, History of learning disability, Long-term use of high-risk medication, and Overweight (BMI 25.0-29.9) were also pertinent to this visit.  HPI George Fowler presents for follow up.on RECENT ABNORMAL HEAD CT.  Patient was seen in August after having blunt head trauma during a syncopal episode which occurred after developing a rapid  intense allergic skin reaction to some unknown environmental insult which occurred while he was weed eating a property (not his own). He was treated for the rash which developed into a pustular cellulitis  And was  sent for CT head .  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  Head CT reviewed with patient  and mother.  No acute changes, however  generalized atrophy,  atherosclerotic calcifications and an old right BG lacunar infarct were seen  The chronicity and  relevance of these findings were discussed  With mother. No prior imaging for comparison .    She recalls that he may have had a head injury in 2003 after a work related accident that broke several bones in his foot.  No CT done at that time.   Brief history of  tobacco use for 3 years, but  quit smoking 20 years ago   The rash has nearly resolved.  Still has a hyperpigmented macules on forearms at the sites of the previously seen pustules.  Denies itching. Denies recurrent episodes but has not returned to  the particular yard in which the incident occurred.  .    Exercises regularly .  Has lost 4 lbs since last .  Goal  Is 150 to 160 lbs      Outpatient Medications Prior to Visit  Medication Sig Dispense Refill  . amLODipine (NORVASC) 5 MG tablet Take 1 tablet (5 mg total) by mouth daily. 90 tablet 3  . Calcium Carbonate-Vitamin D (CALCIUM 600+D) 600-400 MG-UNIT per tablet Take 1 tablet by mouth daily.     Marland Kitchen gabapentin (NEURONTIN) 300 MG capsule Take 3 capsules (900 mg total) by mouth 3 (three) times daily as needed. 270 capsule 5  . mometasone (ELOCON) 0.1 % cream APPLY TO AFFECTED AREA ONCE DAILY 15 g 0  . Omega-3 Fatty Acids (FISH OIL) 1200 MG CAPS Take by mouth.    . vitamin B-12 (CYANOCOBALAMIN) 500 MCG tablet Take 500 mcg by mouth 2 (two) times daily.      . vitamin E 400 UNIT capsule Take 400 Units by mouth daily.      Marland Kitchen doxycycline (VIBRA-TABS) 100 MG tablet Take 1 tablet (100 mg total) by mouth 2 (two) times daily. (Patient not taking: Reported on 12/02/2016) 14 tablet 0  . predniSONE (DELTASONE) 10 MG tablet 6 tablets on Day 1 , then reduce by 1 tablet daily until gone (Patient not taking: Reported on 12/02/2016) 21 tablet 0   No facility-administered medications prior to visit.     Review of Systems;  Patient denies headache, fevers, malaise, unintentional weight loss,  skin rash, eye pain, sinus congestion and sinus pain, sore throat, dysphagia,  hemoptysis , cough, dyspnea, wheezing, chest pain, palpitations, orthopnea, edema, abdominal pain, nausea, melena, diarrhea, constipation, flank pain, dysuria, hematuria, urinary  Frequency, nocturia, numbness, tingling, seizures,  Focal weakness, Loss of consciousness,  Tremor, insomnia, depression, anxiety, and suicidal ideation.      Objective:  BP 130/82 (BP Location: Left Arm, Patient Position: Sitting, Cuff Size: Normal)   Pulse 62   Temp 98.5 F (36.9 C) (Oral)   Resp 15   Ht  (1.753 m)   Wt 184 lb 9.6 oz (83.7 kg)    SpO2 96%   BMI 27.26 kg/m   BP Readings from Last 3 Encounters:  12/02/16 130/82  10/30/16 (!) 162/90  07/16/16 (!) 138/92    Wt Readings from Last 3 Encounters:  12/02/16 184 lb 9.6 oz (83.7 kg)  10/30/16 182 lb 12.8 oz (82.9 kg)  07/16/16 186 lb 6.4 oz (84.6 kg)    General appearance: alert, cooperative and appears stated age Throat: lips, mucosa, and tongue normal; teeth and gums normal Neck: no adenopathy, no carotid bruit, supple, symmetrical, trachea midline and thyroid not enlarged, symmetric, no tenderness/mass/nodules Back: symmetric, no curvature. ROM normal. No CVA tenderness. Lungs: clear to auscultation bilaterally Heart: regular rate and rhythm, S1, S2 normal, no murmur, click, rub or gallop Abdomen: soft, non-tender; bowel sounds normal; no masses,  no organomegaly Pulses: 2+ and symmetric Skin:   several hyperpigmented macules remain. No persistent or new rashes or lesions Lymph nodes: Cervical, supraclavicular, and axillary nodes normal.  Lab Results  Component Value Date   HGBA1C 5.8 12/02/2016   HGBA1C 5.7 03/22/2016   HGBA1C 5.7 11/02/2015    Lab Results  Component Value Date   CREATININE 1.32 10/30/2016   CREATININE 1.17 03/22/2016   CREATININE 1.53 (H) 11/02/2015    Lab Results  Component Value Date   WBC 9.6 10/30/2016   HGB 15.3 10/30/2016   HCT 46.1 10/30/2016   PLT 187.0 10/30/2016   GLUCOSE 114 (H) 10/30/2016   CHOL 223 (H) 12/02/2016   TRIG 248.0 (H) 12/02/2016   HDL 48.30 12/02/2016   LDLDIRECT 135.0 12/02/2016   ALT 27 10/30/2016   AST 27 10/30/2016   NA 140 10/30/2016   K 4.0 10/30/2016   CL 105 10/30/2016   CREATININE 1.32 10/30/2016   BUN 19 10/30/2016   CO2 29 10/30/2016   TSH 2.52 08/21/2015   HGBA1C 5.8 12/02/2016    Ct Head Wo Contrast  Result Date: 10/30/2016 EXAM: CT HEAD WITHOUT CONTRAST TECHNIQUE: Contiguous axial images were obtained from the base of the skull through the vertex without intravenous contrast.  COMPARISON:  None. FINDINGS: Brain: No evidence of acute infarction, hemorrhage, extra-axial collection, ventriculomegaly, or mass effect. Old right basal ganglia lacunar infarct. Generalized cerebral atrophy. Periventricular white matter low attenuation likely secondary to microangiopathy. Vascular: Cerebrovascular atherosclerotic calcifications are noted. Skull: Negative for fracture or focal lesion. Sinuses/Orbits: Visualized portions of the orbits are unremarkable. Mastoid sinuses are clear. Mild mucosal thickening of bilateral ethmoid sinuses. Other: None. IMPRESSION: No acute intracranial pathology. Electronically Signed   By: Elige Ko   On: 10/30/2016 09:42    Assessment & Plan:   Problem List Items Addressed This Visit    Concussion with less than 1 hour loss of consciousness    He denies headaches,  Blurred vision and lethargy       Essential hypertension, benign    Amlodipine dose increased to 10  mg today given lacunar infarct (old) noted on head CT       Relevant Medications   atorvastatin (LIPITOR) 20 MG tablet   History of learning disability    Per mother , since infancy.  He is able to work  In Engineer, water care.       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  Statin advised,  Trial of lipitor  .  Baseline lipids done today reflect need .  Lab Results  Component Value Date   CHOL 223 (H) 12/02/2016   HDL 48.30 12/02/2016   LDLDIRECT 135.0 12/02/2016   TRIG 248.0 (H) 12/02/2016   CHOLHDL 5 12/02/2016         Relevant Medications   atorvastatin (LIPITOR) 20 MG tablet   Overweight (BMI 25.0-29.9)    With a1c of 5.8 suggesting increased risk for T2DM.  I have congratulated him in reduction of   BMI and encouraged  Continued weight loss with goal of 10% of body weigth over the next 6 months using a low glycemic index diet and regular exercise a minimum of 5 days per week.        Sequela of lacunar  infarction    Left BG lacunar infarct noted on CT head done after LOC following blunt head trauma during a fall. No history of acute neurologic deficits reported by patient or mother.  Stressed the importance of blood pressure control with goal < 130/80.  Amlodipine dose increased to 10 mg daily today       Other Visit Diagnoses    Impaired fasting glucose    -  Primary   Relevant Orders   Hemoglobin A1c (Completed)   Atherosclerosis of coronary artery of native heart without angina pectoris, unspecified vessel or lesion type       Relevant Medications   atorvastatin (LIPITOR) 20 MG tablet   Other Relevant Orders   Lipid panel (Completed)   Long-term use of high-risk medication       Relevant Orders   Comprehensive metabolic panel     A total of 40 minutes was spent with patient more than half of which was spent in counseling patient on the above mentioned issues , reviewing and explaining recent labs and imaging studies done, and coordination of care.  I have discontinued Mr. Dymek doxycycline and predniSONE. I am also having him start on atorvastatin. Additionally, I am having him maintain his Calcium Carbonate-Vitamin D, vitamin B-12, vitamin E, mometasone, amLODipine, Fish Oil, and gabapentin.  Meds ordered this encounter  Medications  . atorvastatin (LIPITOR) 20 MG tablet    Sig: Take 1 tablet (20 mg total) by mouth daily.    Dispense:  30 tablet    Refill:  2    Medications Discontinued During This Encounter  Medication Reason  . doxycycline (VIBRA-TABS) 100 MG tablet Completed Course  . predniSONE (DELTASONE) 10 MG tablet Completed Course    Follow-up: No Follow-up on file.   Sherlene Shams, MD

## 2016-12-03 ENCOUNTER — Telehealth: Payer: Self-pay | Admitting: Internal Medicine

## 2016-12-03 DIAGNOSIS — E663 Overweight: Secondary | ICD-10-CM | POA: Insufficient documentation

## 2016-12-03 DIAGNOSIS — Z87898 Personal history of other specified conditions: Secondary | ICD-10-CM | POA: Insufficient documentation

## 2016-12-03 DIAGNOSIS — I6930 Unspecified sequelae of cerebral infarction: Secondary | ICD-10-CM | POA: Insufficient documentation

## 2016-12-03 NOTE — Assessment & Plan Note (Signed)
He denies headaches,  Blurred vision and lethargy

## 2016-12-03 NOTE — Assessment & Plan Note (Signed)
Per mother , since infancy.  He is able to work  In Engineer, water care.

## 2016-12-03 NOTE — Telephone Encounter (Signed)
Pt mom dropped off letter for Dr. Darrick Huntsman. In reference to pt birth history. Placed in colored foldered

## 2016-12-03 NOTE — Assessment & Plan Note (Signed)
With a1c of 5.8 suggesting increased risk for T2DM.  I have congratulated him in reduction of   BMI and encouraged  Continued weight loss with goal of 10% of body weigth over the next 6 months using a low glycemic index diet and regular exercise a minimum of 5 days per week.

## 2016-12-03 NOTE — Assessment & Plan Note (Signed)
Left BG lacunar infarct noted on CT head done after LOC following blunt head trauma during a fall. No history of acute neurologic deficits reported by patient or mother.  Stressed the importance of blood pressure control with goal < 130/80.  Amlodipine dose increased to 10 mg daily today

## 2016-12-03 NOTE — Assessment & Plan Note (Signed)
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  Statin advised,  Trial of lipitor  .  Baseline lipids done today reflect need .  Lab Results  Component Value Date   CHOL 223 (H) 12/02/2016   HDL 48.30 12/02/2016   LDLDIRECT 135.0 12/02/2016   TRIG 248.0 (H) 12/02/2016   CHOLHDL 5 12/02/2016

## 2016-12-03 NOTE — Assessment & Plan Note (Signed)
Amlodipine dose increased to 10 mg today given lacunar infarct (old) noted on head CT

## 2016-12-03 NOTE — Telephone Encounter (Signed)
Placed in red folder  

## 2016-12-05 ENCOUNTER — Telehealth: Payer: Self-pay | Admitting: Internal Medicine

## 2016-12-05 NOTE — Telephone Encounter (Signed)
Pt called looking for lab results. Advised pt that they have not yet been reviewed. Please advise, thank you!  Call pt @ [phone]

## 2016-12-06 ENCOUNTER — Other Ambulatory Visit: Payer: Self-pay | Admitting: Internal Medicine

## 2016-12-06 DIAGNOSIS — Z79899 Other long term (current) drug therapy: Secondary | ICD-10-CM

## 2016-12-06 NOTE — Progress Notes (Signed)
.  comp

## 2016-12-06 NOTE — Telephone Encounter (Signed)
Please advise 

## 2016-12-30 ENCOUNTER — Other Ambulatory Visit: Payer: Self-pay | Admitting: Internal Medicine

## 2017-01-01 ENCOUNTER — Other Ambulatory Visit (INDEPENDENT_AMBULATORY_CARE_PROVIDER_SITE_OTHER): Payer: Medicare Other

## 2017-01-01 DIAGNOSIS — Z79899 Other long term (current) drug therapy: Secondary | ICD-10-CM | POA: Diagnosis not present

## 2017-01-01 LAB — COMPREHENSIVE METABOLIC PANEL
ALK PHOS: 64 U/L (ref 39–117)
ALT: 20 U/L (ref 0–53)
AST: 20 U/L (ref 0–37)
Albumin: 4.1 g/dL (ref 3.5–5.2)
BILIRUBIN TOTAL: 1.1 mg/dL (ref 0.2–1.2)
BUN: 15 mg/dL (ref 6–23)
CALCIUM: 9.1 mg/dL (ref 8.4–10.5)
CO2: 27 mEq/L (ref 19–32)
Chloride: 104 mEq/L (ref 96–112)
Creatinine, Ser: 1.28 mg/dL (ref 0.40–1.50)
GFR: 63.28 mL/min (ref >60.00)
Glucose, Bld: 98 mg/dL (ref 70–99)
Potassium: 4.6 mEq/L (ref 3.5–5.1)
Sodium: 137 mEq/L (ref 135–145)
TOTAL PROTEIN: 6.3 g/dL (ref 6.0–8.3)

## 2017-01-03 ENCOUNTER — Ambulatory Visit (INDEPENDENT_AMBULATORY_CARE_PROVIDER_SITE_OTHER): Payer: Medicare Other | Admitting: Internal Medicine

## 2017-01-03 ENCOUNTER — Encounter: Payer: Self-pay | Admitting: Internal Medicine

## 2017-01-03 VITALS — BP 124/80 | HR 68 | Temp 98.0°F | Resp 15 | Ht 69.0 in | Wt 182.4 lb

## 2017-01-03 DIAGNOSIS — R5383 Other fatigue: Secondary | ICD-10-CM | POA: Diagnosis not present

## 2017-01-03 DIAGNOSIS — I10 Essential (primary) hypertension: Secondary | ICD-10-CM | POA: Diagnosis not present

## 2017-01-03 DIAGNOSIS — Z0000 Encounter for general adult medical examination without abnormal findings: Secondary | ICD-10-CM | POA: Diagnosis not present

## 2017-01-03 DIAGNOSIS — E785 Hyperlipidemia, unspecified: Secondary | ICD-10-CM

## 2017-01-03 MED ORDER — ATORVASTATIN CALCIUM 20 MG PO TABS: 20.0000 mg | ORAL_TABLET | Freq: Every day | ORAL | 2 refills | Status: DC

## 2017-01-03 MED ORDER — GABAPENTIN 300 MG PO CAPS: 900.0000 mg | ORAL_CAPSULE | Freq: Two times a day (BID) | ORAL | 3 refills | Status: DC

## 2017-01-03 NOTE — Assessment & Plan Note (Signed)

## 2017-01-03 NOTE — Patient Instructions (Signed)
Continue your current medications including amlodipine 5 mg daily  If your Blood pressure starts to stay above 140,  We will increase to 10 mg   Health Maintenance, Male A healthy lifestyle and preventive care is important for your health and wellness. Ask your health care provider about what schedule of regular examinations is right for you. What should I know about weight and diet? Eat a Healthy Diet  Eat plenty of vegetables, fruits, whole grains, low-fat dairy products, and lean protein.  Do not eat a lot of foods high in solid fats, added sugars, or salt.  Maintain a Healthy Weight Regular exercise can help you achieve or maintain a healthy weight. You should:  Do at least 150 minutes of exercise each week. The exercise should increase your heart rate and make you sweat (moderate-intensity exercise).  Do strength-training exercises at least twice a week.  Watch Your Levels of Cholesterol and Blood Lipids  Have your blood tested for lipids and cholesterol every 5 years starting at 50 years of age. If you are at high risk for heart disease, you should start having your blood tested when you are 50 years old. You may need to have your cholesterol levels checked more often if: ? Your lipid or cholesterol levels are high. ? You are older than 50 years of age. ? You are at high risk for heart disease.  What should I know about cancer screening? Many types of cancers can be detected early and may often be prevented. Lung Cancer  You should be screened every year for lung cancer if: ? You are a current smoker who has smoked for at least 30 years. ? You are a former smoker who has quit within the past 15 years.  Talk to your health care provider about your screening options, when you should start screening, and how often you should be screened.  Colorectal Cancer  Routine colorectal cancer screening usually begins at 50 years of age and should be repeated every 5-10 years until you  are 50 years old. You may need to be screened more often if early forms of precancerous polyps or small growths are found. Your health care provider may recommend screening at an earlier age if you have risk factors for colon cancer.  Your health care provider may recommend using home test kits to check for hidden blood in the stool.  A small camera at the end of a tube can be used to examine your colon (sigmoidoscopy or colonoscopy). This checks for the earliest forms of colorectal cancer.  Prostate and Testicular Cancer  Depending on your age and overall health, your health care provider may do certain tests to screen for prostate and testicular cancer.  Talk to your health care provider about any symptoms or concerns you have about testicular or prostate cancer.  Skin Cancer  Check your skin from head to toe regularly.  Tell your health care provider about any new moles or changes in moles, especially if: ? There is a change in a mole's size, shape, or color. ? You have a mole that is larger than a pencil eraser.  Always use sunscreen. Apply sunscreen liberally and repeat throughout the day.  Protect yourself by wearing long sleeves, pants, a wide-brimmed hat, and sunglasses when outside.  What should I know about heart disease, diabetes, and high blood pressure?  If you are 5618-50 years of age, have your blood pressure checked every 3-5 years. If you are 50 years of age  or older, have your blood pressure checked every year. You should have your blood pressure measured twice-once when you are at a hospital or clinic, and once when you are not at a hospital or clinic. Record the average of the two measurements. To check your blood pressure when you are not at a hospital or clinic, you can use: ? An automated blood pressure machine at a pharmacy. ? A home blood pressure monitor.  Talk to your health care provider about your target blood pressure.  If you are between 26-68 years old,  ask your health care provider if you should take aspirin to prevent heart disease.  Have regular diabetes screenings by checking your fasting blood sugar level. ? If you are at a normal weight and have a low risk for diabetes, have this test once every three years after the age of 27. ? If you are overweight and have a high risk for diabetes, consider being tested at a younger age or more often.  A one-time screening for abdominal aortic aneurysm (AAA) by ultrasound is recommended for men aged 83-75 years who are current or former smokers. What should I know about preventing infection? Hepatitis B If you have a higher risk for hepatitis B, you should be screened for this virus. Talk with your health care provider to find out if you are at risk for hepatitis B infection. Hepatitis C Blood testing is recommended for:  Everyone born from [birth year] through 1964.  Anyone with known risk factors for hepatitis C.  Sexually Transmitted Diseases (STDs)  You should be screened each year for STDs including gonorrhea and chlamydia if: ? You are sexually active and are younger than 50 years of age. ? You are older than 50 years of age and your health care provider tells you that you are at risk for this type of infection. ? Your sexual activity has changed since you were last screened and you are at an increased risk for chlamydia or gonorrhea. Ask your health care provider if you are at risk.  Talk with your health care provider about whether you are at high risk of being infected with HIV. Your health care provider may recommend a prescription medicine to help prevent HIV infection.  What else can I do?  Schedule regular health, dental, and eye exams.  Stay current with your vaccines (immunizations).  Do not use any tobacco products, such as cigarettes, chewing tobacco, and e-cigarettes. If you need help quitting, ask your health care provider.  Limit alcohol intake to no more than 2 drinks per  day. One drink equals 12 ounces of beer, 5 ounces of wine, or 1 ounces of hard liquor.  Do not use street drugs.  Do not share needles.  Ask your health care provider for help if you need support or information about quitting drugs.  Tell your health care provider if you often feel depressed.  Tell your health care provider if you have ever been abused or do not feel safe at home. This information is not intended to replace advice given to you by your health care provider. Make sure you discuss any questions you have with your health care provider. Document Released: 08/31/2007 Document Revised: 11/01/2015 Document Reviewed: 12/06/2014 Elsevier Interactive Patient Education  [employer].

## 2017-01-03 NOTE — Progress Notes (Signed)
Patient ID: George Fowler, male    DOB: September 01, 1965  Age: 51 y.o. MRN: [id number]  The patient is here for annual preventive wellness examination and management of other chronic and acute problems.   The risk factors are reflected in the social history.  The roster of all physicians providing medical care to patient - is listed in the Snapshot section of the chart.  Activities of daily living:  The patient is 100% independent in all ADLs: dressing, toileting, feeding as well as independent mobility  Home safety : The patient has smoke detectors in the home. They wear seatbelts.  There are no firearms at home. There is no violence in the home.   There is no risks for hepatitis, STDs or HIV. There is no   history of blood transfusion. They have no travel history to infectious disease endemic areas of the world.  The patient has seen their dentist in the last six month. They have seen their eye doctor in the last year. They admit to slight hearing difficulty with regard to whispered voices and some television programs.  They have deferred audiologic testing in the last year.  They do not  have excessive sun exposure. Discussed the need for sun protection: hats, long sleeves and use of sunscreen if there is significant sun exposure.   Diet: the importance of a healthy diet is discussed. They do have a healthy diet.  The benefits of regular aerobic exercise were discussed. She walks 4 times per week ,  20 minutes.   Depression screen: there are no signs or vegative symptoms of depression- irritability, change in appetite, anhedonia, sadness/tearfullness.  Cognitive assessment: the patient manages all their financial and personal affairs and is actively engaged. They could relate day,date,year and events; recalled 2/3 objects at 3 minutes; performed clock-face test normally.  The following portions of the patient's history were reviewed and updated as appropriate: allergies, current medications,  past family history, past medical history,  past surgical history, past social history  and problem list.  Visual acuity was not assessed per patient preference since she has regular follow up with her ophthalmologist. Hearing and body mass index were assessed and reviewed.   During the course of the visit the patient was educated and counseled about appropriate screening and preventive services including : fall prevention , diabetes screening, nutrition counseling, colorectal cancer screening, and recommended immunizations.    CC: The primary encounter diagnosis was Essential hypertension, benign. Diagnoses of Hyperlipidemia LDL goal <100, Fatigue, unspecified type, and Encounter for preventive health examination were also pertinent to this visit.  follow up on   Hypertension and hyperlipidemia   Has not increased the amlodipine because his home readings have been for the most part < 140/80,  And at times  AS LOW AS 121/70.     History Trejuan has a past medical history of Foot injury; Neuropathy of leg; No pertinent past medical history; Seasonal allergic rhinitis; and Umbilical hernia (11/14/2010).   He has a past surgical history that includes Foot surgery (2003-lt); Foot arthroplasty (2003); Eye surgery (2005); and Umbilical hernia repair (09/25/2011).   His family history includes Cancer in his unknown relative; Diabetes in his mother; Heart disease in his paternal grandfather and paternal grandmother; Hypertension in his father and mother.He reports that he quit smoking about 26 years ago. He has never used smokeless tobacco. He reports that he drinks alcohol. He reports that he does not use drugs.  Outpatient Medications Prior to Visit  Medication  Sig Dispense Refill  . amLODipine (NORVASC) 5 MG tablet Take 1 tablet (5 mg total) by mouth daily. 90 tablet 3  . Calcium Carbonate-Vitamin D (CALCIUM 600+D) 600-400 MG-UNIT per tablet Take 1 tablet by mouth daily.     Marland Kitchen gabapentin (NEURONTIN)  300 MG capsule Take 3 capsules (900 mg total) by mouth 3 (three) times daily as needed. 270 capsule 5  . vitamin B-12 (CYANOCOBALAMIN) 500 MCG tablet Take 500 mcg by mouth 2 (two) times daily.      . vitamin E 400 UNIT capsule Take 400 Units by mouth daily.      Marland Kitchen atorvastatin (LIPITOR) 20 MG tablet Take 1 tablet (20 mg total) by mouth daily. 30 tablet 2  . gabapentin (NEURONTIN) 300 MG capsule TAKE 1 TO 2 CAPSULES BY MOUTH THREE TIMES DAILY AS NEEDED (Patient not taking: Reported on 01/03/2017) 180 capsule 5  . mometasone (ELOCON) 0.1 % cream APPLY TO AFFECTED AREA ONCE DAILY (Patient not taking: Reported on 01/03/2017) 15 g 0  . Omega-3 Fatty Acids (FISH OIL) 1200 MG CAPS Take by mouth.     No facility-administered medications prior to visit.     Review of Systems   Patient denies headache, fevers, malaise, unintentional weight loss, skin rash, eye pain, sinus congestion and sinus pain, sore throat, dysphagia,  hemoptysis , cough, dyspnea, wheezing, chest pain, palpitations, orthopnea, edema, abdominal pain, nausea, melena, diarrhea, constipation, flank pain, dysuria, hematuria, urinary  Frequency, nocturia, numbness, tingling, seizures,  Focal weakness, Loss of consciousness,  Tremor, insomnia, depression, anxiety, and suicidal ideation.      Objective:  BP 124/80 (BP Location: Left Arm, Patient Position: Sitting, Cuff Size: Normal)   Pulse 68   Temp 98 F (36.7 C) (Oral)   Resp 15   Ht 5\' 9"  (1.753 m)   Wt 182 lb 6.4 oz (82.7 kg)   SpO2 98%   BMI 26.94 kg/m   Physical Exam   General appearance: alert, cooperative and appears stated age Ears: normal TM's and external ear canals both ears Throat: lips, mucosa, and tongue normal; teeth and gums normal Neck: no adenopathy, no carotid bruit, supple, symmetrical, trachea midline and thyroid not enlarged, symmetric, no tenderness/mass/nodules Back: symmetric, no curvature. ROM normal. No CVA tenderness. Lungs: clear to auscultation  bilaterally Heart: regular rate and rhythm, S1, S2 normal, no murmur, click, rub or gallop Abdomen: soft, non-tender; bowel sounds normal; no masses,  no organomegaly Pulses: 2+ and symmetric Skin: Skin color, texture, turgor normal. No rashes or lesions Lymph nodes: Cervical, supraclavicular, and axillary nodes normal.    Assessment & Plan:   Problem List Items Addressed This Visit    Encounter for preventive health examination    Annual comprehensive preventive exam was done as well as an evaluation and management of acute and chronic conditions .  During the course of the visit the patient was educated and counseled about appropriate screening and preventive services including :  diabetes screening, lipid analysis with projected  10 year  risk for CAD , nutrition counseling, prostate and colorectal cancer screening, and recommended immunizations.  Printed recommendations for health maintenance screenings was given.       Essential hypertension, benign - Primary   Relevant Medications   atorvastatin (LIPITOR) 20 MG tablet   Other Relevant Orders   Comprehensive metabolic panel   Hyperlipidemia LDL goal <100   Relevant Medications   atorvastatin (LIPITOR) 20 MG tablet   Other Relevant Orders   Lipid panel  Other Visit Diagnoses    Fatigue, unspecified type       Relevant Orders   Vitamin B12   TSH      I have discontinued Mr. Rennie PlowmanMurray's mometasone and Fish Oil. I have also changed his gabapentin. Additionally, I am having him maintain his Calcium Carbonate-Vitamin D, vitamin B-12, vitamin E, amLODipine, gabapentin, and atorvastatin.  Meds ordered this encounter  Medications  . gabapentin (NEURONTIN) 300 MG capsule    Sig: Take 3 capsules (900 mg total) by mouth 2 (two) times daily.    Dispense:  540 capsule    Refill:  3    90 day supply,  Keep on file for next refill in 30 days  . atorvastatin (LIPITOR) 20 MG tablet    Sig: Take 1 tablet (20 mg total) by mouth daily.     Dispense:  90 tablet    Refill:  2    Medications Discontinued During This Encounter  Medication Reason  . gabapentin (NEURONTIN) 300 MG capsule Patient has not taken in last 30 days  . mometasone (ELOCON) 0.1 % cream Patient has not taken in last 30 days  . Omega-3 Fatty Acids (FISH OIL) 1200 MG CAPS Patient has not taken in last 30 days  . atorvastatin (LIPITOR) 20 MG tablet Reorder    Follow-up: Return in about 6 months (around 07/04/2017) for fasting labs prior to appt .   Sherlene ShamsULLO, Trea Carnegie L, MD

## 2017-01-05 NOTE — Assessment & Plan Note (Signed)
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  Statin advised,  He is tolerating a trial of lipitor.  Liver enzymes are normal.     Lab Results  Component Value Date   ALT 20 01/01/2017   AST 20 01/01/2017   ALKPHOS 64 01/01/2017   BILITOT 1.1 01/01/2017

## 2017-01-05 NOTE — Assessment & Plan Note (Signed)
Well controlled on current regimen. Has not increased amlodipine. Renal function stable, no changes today.  Lab Results  Component Value Date   CREATININE 1.28 01/01/2017   Lab Results  Component Value Date   NA 137 01/01/2017   K 4.6 01/01/2017   CL 104 01/01/2017   CO2 27 01/01/2017   No results found for: Concepcion ElkMICROALBUR, [id number]

## 2017-05-26 ENCOUNTER — Other Ambulatory Visit: Payer: Self-pay | Admitting: Internal Medicine

## 2017-06-02 ENCOUNTER — Ambulatory Visit: Payer: Medicare Other

## 2017-06-03 ENCOUNTER — Telehealth: Payer: Self-pay

## 2017-06-03 DIAGNOSIS — G609 Hereditary and idiopathic neuropathy, unspecified: Secondary | ICD-10-CM

## 2017-06-03 NOTE — Telephone Encounter (Signed)
Pt needs order for foot inserts

## 2017-07-03 ENCOUNTER — Other Ambulatory Visit: Payer: Medicare Other

## 2017-07-04 ENCOUNTER — Other Ambulatory Visit: Payer: Medicare Other

## 2017-07-07 ENCOUNTER — Ambulatory Visit: Payer: Medicare Other | Admitting: Internal Medicine

## 2017-07-31 ENCOUNTER — Other Ambulatory Visit (INDEPENDENT_AMBULATORY_CARE_PROVIDER_SITE_OTHER): Payer: Medicare Other

## 2017-07-31 DIAGNOSIS — R5383 Other fatigue: Secondary | ICD-10-CM

## 2017-07-31 DIAGNOSIS — E785 Hyperlipidemia, unspecified: Secondary | ICD-10-CM

## 2017-07-31 DIAGNOSIS — I10 Essential (primary) hypertension: Secondary | ICD-10-CM | POA: Diagnosis not present

## 2017-07-31 LAB — COMPREHENSIVE METABOLIC PANEL
ALBUMIN: 4.1 g/dL (ref 3.5–5.2)
ALK PHOS: 85 U/L (ref 39–117)
ALT: 23 U/L (ref 0–53)
AST: 19 U/L (ref 0–37)
BUN: 15 mg/dL (ref 6–23)
CHLORIDE: 106 meq/L (ref 96–112)
CO2: 29 mEq/L (ref 19–32)
Calcium: 9 mg/dL (ref 8.4–10.5)
Creatinine, Ser: 1.3 mg/dL (ref 0.40–1.50)
GFR: 62.01 mL/min (ref >60.00)
Glucose, Bld: 100 mg/dL — ABNORMAL HIGH (ref 70–99)
Potassium: 4.5 mEq/L (ref 3.5–5.1)
SODIUM: 141 meq/L (ref 135–145)
TOTAL PROTEIN: 6.5 g/dL (ref 6.0–8.3)
Total Bilirubin: 0.8 mg/dL (ref 0.2–1.2)

## 2017-07-31 LAB — TSH: TSH: 1.03 u[IU]/mL (ref 0.35–4.50)

## 2017-07-31 LAB — LIPID PANEL
CHOLESTEROL: 138 mg/dL (ref 0–200)
HDL: 37.8 mg/dL — ABNORMAL LOW (ref >39.00)
LDL Cholesterol: 76 mg/dL (ref 0–99)
NonHDL: 100.1
Total CHOL/HDL Ratio: 4
Triglycerides: 123 mg/dL (ref 0.0–149.0)
VLDL: 24.6 mg/dL (ref 0.0–40.0)

## 2017-07-31 LAB — VITAMIN B12: VITAMIN B 12: 1225 pg/mL — AB (ref 211–911)

## 2017-08-06 ENCOUNTER — Encounter: Payer: Self-pay | Admitting: Internal Medicine

## 2017-08-06 ENCOUNTER — Ambulatory Visit (INDEPENDENT_AMBULATORY_CARE_PROVIDER_SITE_OTHER): Payer: Medicare Other | Admitting: Internal Medicine

## 2017-08-06 VITALS — BP 114/86 | HR 61 | Temp 97.8°F | Resp 15 | Ht 69.0 in | Wt 186.6 lb

## 2017-08-06 DIAGNOSIS — R7303 Prediabetes: Secondary | ICD-10-CM | POA: Diagnosis not present

## 2017-08-06 DIAGNOSIS — N529 Male erectile dysfunction, unspecified: Secondary | ICD-10-CM | POA: Diagnosis not present

## 2017-08-06 DIAGNOSIS — E785 Hyperlipidemia, unspecified: Secondary | ICD-10-CM | POA: Diagnosis not present

## 2017-08-06 DIAGNOSIS — I10 Essential (primary) hypertension: Secondary | ICD-10-CM

## 2017-08-06 LAB — POCT GLYCOSYLATED HEMOGLOBIN (HGB A1C): HEMOGLOBIN A1C: 5.8 % — AB (ref 4.0–5.6)

## 2017-08-06 MED ORDER — SILDENAFIL CITRATE 20 MG PO TABS: 20.0000 mg | ORAL_TABLET | Freq: Three times a day (TID) | ORAL | 0 refills | Status: DC

## 2017-08-06 NOTE — Progress Notes (Signed)
Subjective:  Patient ID: George Fowler, male    DOB: 04-Nov-1965  Age: 53 y.o. MRN: [id number]  CC: The primary encounter diagnosis was Impotence. Diagnoses of Hyperlipidemia LDL goal <100, Prediabetes, Essential hypertension, benign, and Erectile dysfunction, unspecified erectile dysfunction type were also pertinent to this visit.  HPI George Fowler presents for follow up on hypertension  Hyperlipidemia and IPG  Hypertension: patient checks blood pressure twice weekly at home.  Readings have been for the most part > 140/80 at rest . Patient is following a reduced salt diet most days and is taking medications as prescribed  Recent labs reviewed   Allergic rhinitis managed with zyrtec. Working 7 days per week.  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 Using ear protection .  Work is from 9 pm to   4 am .  Problems with erection. Had a prolonged erection several years ago  lasting 2.5. Hours during sex,  Was not painful,  Was able to achieve orgasm. Worried that this was abnormal.  Not sexually active currently.    .  Outpatient Medications Prior to Visit  Medication Sig Dispense Refill  . amLODipine (NORVASC) 5 MG tablet TAKE 1 TABLET BY MOUTH DAILY. 90 tablet 3  . atorvastatin (LIPITOR) 20 MG tablet Take 1 tablet (20 mg total) by mouth daily. 90 tablet 2  . Calcium Carbonate-Vitamin D (CALCIUM 600+D) 600-400 MG-UNIT per tablet Take 1 tablet by mouth daily.     Marland Kitchen gabapentin (NEURONTIN) 300 MG capsule Take 3 capsules (900 mg total) by mouth 2 (two) times daily. 540 capsule 3  . Vitamin A 40981 units TABS Take 1 tablet by mouth daily.    . vitamin B-12 (CYANOCOBALAMIN) 500 MCG tablet Take 500 mcg by mouth 2 (two) times daily.      Marland Kitchen gabapentin (NEURONTIN) 300 MG capsule Take 3 capsules (900 mg total) by mouth 3 (three) times daily as needed. (Patient not taking: Reported on 08/06/2017) 270 capsule 5  . vitamin E 400 UNIT capsule Take 400 Units  by mouth daily.       No facility-administered medications prior to visit.     Review of Systems;  Patient denies headache, fevers, malaise, unintentional weight loss, skin rash, eye pain, sinus congestion and sinus pain, sore throat, dysphagia,  hemoptysis , cough, dyspnea, wheezing, chest pain, palpitations, orthopnea, edema, abdominal pain, nausea, melena, diarrhea, constipation, flank pain, dysuria, hematuria, urinary  Frequency, nocturia, numbness, tingling, seizures,  Focal weakness, Loss of consciousness,  Tremor, insomnia, depression, anxiety, and suicidal ideation.      Objective:  BP 114/86 (BP Location: Left Arm, Patient Position: Sitting, Cuff Size: Normal)   Pulse 61   Temp 97.8 F (36.6 C) (Oral)   Resp 15   Ht  (1.753 m)   Wt 186 lb 9.6 oz (84.6 kg)   SpO2 97%   BMI 27.56 kg/m   BP Readings from Last 3 Encounters:  08/06/17 114/86  01/03/17 124/80  12/02/16 130/82    Wt Readings from Last 3 Encounters:  08/06/17 186 lb 9.6 oz (84.6 kg)  01/03/17 182 lb 6.4 oz (82.7 kg)  12/02/16 184 lb 9.6 oz (83.7 kg)    General appearance: alert, cooperative and appears stated age Ears: normal TM's and external ear canals both ears Throat: lips, mucosa, and tongue normal; teeth and gums normal Neck: no adenopathy, no carotid bruit, supple, symmetrical, trachea midline and thyroid not enlarged, symmetric, no  tenderness/mass/nodules Back: symmetric, no curvature. ROM normal. No CVA tenderness. Lungs: clear to auscultation bilaterally Heart: regular rate and rhythm, S1, S2 normal, no murmur, click, rub or gallop Abdomen: soft, non-tender; bowel sounds normal; no masses,  no organomegaly Pulses: 2+ and symmetric Skin: Skin color, texture, turgor normal. No rashes or lesions Lymph nodes: Cervical, supraclavicular, and axillary nodes normal.  Lab Results  Component Value Date   HGBA1C 5.8 (A) 08/06/2017   HGBA1C 5.8 12/02/2016   HGBA1C 5.7 03/22/2016    Lab  Results  Component Value Date   CREATININE 1.30 07/31/2017   CREATININE 1.28 01/01/2017   CREATININE 1.32 10/30/2016    Lab Results  Component Value Date   WBC 9.6 10/30/2016   HGB 15.3 10/30/2016   HCT 46.1 10/30/2016   PLT 187.0 10/30/2016   GLUCOSE 100 (H) 07/31/2017   CHOL 138 07/31/2017   TRIG 123.0 07/31/2017   HDL 37.80 (L) 07/31/2017   LDLDIRECT 135.0 12/02/2016   LDLCALC 76 07/31/2017   ALT 23 07/31/2017   AST 19 07/31/2017   NA 141 07/31/2017   K 4.5 07/31/2017   CL 106 07/31/2017   CREATININE 1.30 07/31/2017   BUN 15 07/31/2017   CO2 29 07/31/2017   TSH 1.03 07/31/2017   HGBA1C 5.8 (A) 08/06/2017    Ct Head Wo Contrast  Result Date: 10/30/2016 EXAM: CT HEAD WITHOUT CONTRAST TECHNIQUE: Contiguous axial images were obtained from the base of the skull through the vertex without intravenous contrast. COMPARISON:  None. FINDINGS: Brain: No evidence of acute infarction, hemorrhage, extra-axial collection, ventriculomegaly, or mass effect. Old right basal ganglia lacunar infarct. Generalized cerebral atrophy. Periventricular white matter low attenuation likely secondary to microangiopathy. Vascular: Cerebrovascular atherosclerotic calcifications are noted. Skull: Negative for fracture or focal lesion. Sinuses/Orbits: Visualized portions of the orbits are unremarkable. Mastoid sinuses are clear. Mild mucosal thickening of bilateral ethmoid sinuses. Other: None. IMPRESSION: No acute intracranial pathology. Electronically Signed   By: Elige Ko   On: 10/30/2016 09:42    Assessment & Plan:   Problem List Items Addressed This Visit    Prediabetes    his  fasting glucose has never been  diagnostic of diabetes; but your A1c continues to suggest that he is at risk for developing type 2 Diabetes.  I have addressed  BMI and recommended wt loss of 10% of body weight over the next 6 months using a low glycemic index diet and regular exercise a minimum of 5 days per week.         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  Statin advised,  He is tolerating a trial of lipitor.  Liver enzymes are normal.     Lab Results  Component Value Date   CHOL 138 07/31/2017   HDL 37.80 (L) 07/31/2017   LDLCALC 76 07/31/2017   LDLDIRECT 135.0 12/02/2016   TRIG 123.0 07/31/2017   CHOLHDL 4 07/31/2017    Lab Results  Component Value Date   ALT 23 07/31/2017   AST 19 07/31/2017   ALKPHOS 85 07/31/2017   BILITOT 0.8 07/31/2017          Relevant Medications   sildenafil (REVATIO) 20 MG tablet   Other Relevant Orders   Lipid panel   Essential hypertension, benign    Well controlled on current regimen. Renal function stable, no  changes today.  Lab Results  Component Value Date   CREATININE 1.30 07/31/2017   Lab Results  Component Value Date   NA 141 07/31/2017   K 4.5 07/31/2017   CL 106 07/31/2017   CO2 29 07/31/2017        Relevant Medications   sildenafil (REVATIO) 20 MG tablet   Erectile dysfunction    viagr atrial offered       Other Visit Diagnoses    Impotence    -  Primary   Relevant Orders   Testos,Total,Free and SHBG (Male)    A total of 25 minutes of face to face time was spent with patient more than half of which was spent in counselling about the above mentioned conditions  and coordination of care   I have discontinued Yahel D. Sabia's vitamin E. I am also having him start on sildenafil. Additionally, I am having him maintain his Calcium Carbonate-Vitamin D, vitamin B-12, gabapentin, atorvastatin, amLODipine, and Vitamin A.  Meds ordered this encounter  Medications  . sildenafil (REVATIO) 20 MG tablet    Sig: Take 1 tablet (20 mg total) by mouth 3 (three) times daily.    Dispense:  10 tablet    Refill:  0    Medications Discontinued During This Encounter  Medication Reason  .  gabapentin (NEURONTIN) 300 MG capsule Duplicate  . vitamin E 400 UNIT capsule Patient has not taken in last 30 days    Follow-up: Return in about 6 months (around 02/06/2018) for hypertension.   Sherlene Shams, MD

## 2017-08-06 NOTE — Patient Instructions (Signed)
You are doing well!  Your medications are working  You need to start a 30 minute exercise program to keep the weight down  Continue avoiding sugar    Generic viagra sent to [facility].  You can ignore it since your symptoms do not describe impotence.

## 2017-08-07 ENCOUNTER — Encounter: Payer: Self-pay | Admitting: Internal Medicine

## 2017-08-07 DIAGNOSIS — R7303 Prediabetes: Secondary | ICD-10-CM | POA: Insufficient documentation

## 2017-08-07 DIAGNOSIS — N183 Chronic kidney disease, stage 3 unspecified: Secondary | ICD-10-CM | POA: Insufficient documentation

## 2017-08-07 DIAGNOSIS — I129 Hypertensive chronic kidney disease with stage 1 through stage 4 chronic kidney disease, or unspecified chronic kidney disease: Secondary | ICD-10-CM | POA: Insufficient documentation

## 2017-08-07 DIAGNOSIS — E1165 Type 2 diabetes mellitus with hyperglycemia: Secondary | ICD-10-CM | POA: Insufficient documentation

## 2017-08-07 DIAGNOSIS — N529 Male erectile dysfunction, unspecified: Secondary | ICD-10-CM | POA: Insufficient documentation

## 2017-08-07 DIAGNOSIS — E1122 Type 2 diabetes mellitus with diabetic chronic kidney disease: Secondary | ICD-10-CM | POA: Insufficient documentation

## 2017-08-07 NOTE — Assessment & Plan Note (Signed)
his  fasting glucose has never been  diagnostic of diabetes; but your A1c continues to suggest that he is at risk for developing type 2 Diabetes.  I have addressed  BMI and recommended wt loss of 10% of body weight over the next 6 months using a low glycemic index diet and regular exercise a minimum of 5 days per week.

## 2017-08-07 NOTE — Assessment & Plan Note (Signed)
Well controlled on current regimen. Renal function stable, no changes today.  Lab Results  Component Value Date   CREATININE 1.30 07/31/2017   Lab Results  Component Value Date   NA 141 07/31/2017   K 4.5 07/31/2017   CL 106 07/31/2017   CO2 29 07/31/2017

## 2017-08-07 NOTE — Assessment & Plan Note (Signed)
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  Statin advised,  He is tolerating a trial of lipitor.  Liver enzymes are normal.     Lab Results  Component Value Date   CHOL 138 07/31/2017   HDL 37.80 (L) 07/31/2017   LDLCALC 76 07/31/2017   LDLDIRECT 135.0 12/02/2016   TRIG 123.0 07/31/2017   CHOLHDL 4 07/31/2017    Lab Results  Component Value Date   ALT 23 07/31/2017   AST 19 07/31/2017   ALKPHOS 85 07/31/2017   BILITOT 0.8 07/31/2017

## 2017-08-07 NOTE — Assessment & Plan Note (Signed)
viagr atrial offered

## 2017-09-01 ENCOUNTER — Telehealth: Payer: Self-pay | Admitting: Internal Medicine

## 2017-09-01 DIAGNOSIS — Z560 Unemployment, unspecified: Secondary | ICD-10-CM

## 2017-09-01 NOTE — Telephone Encounter (Signed)
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  I do not consider him to be in need of a handicapped sticker  so he will need to explain

## 2017-09-01 NOTE — Telephone Encounter (Signed)
Forms placed in red folder.

## 2017-09-01 NOTE — Telephone Encounter (Signed)
Pt dropped off Disability update form and parking placard to be filled out. Placed in Dr. Ceasar Lundullos color folder upfront. Please advise pt when completed

## 2017-09-09 ENCOUNTER — Telehealth: Payer: Self-pay | Admitting: Internal Medicine

## 2017-09-09 DIAGNOSIS — Z560 Unemployment, unspecified: Secondary | ICD-10-CM | POA: Insufficient documentation

## 2017-09-09 NOTE — Telephone Encounter (Signed)
Pt would like a call back in regards to paperwork he picked up..Marland Kitchen

## 2017-09-09 NOTE — Telephone Encounter (Signed)
Copied from CRM [id number]. Topic: Quick Communication - Office Called Patient >> Sep 09, 2017 12:49 PM Stephannie LiSimmons, Torre Pikus L, NT wrote: Reason for CRM: Patient returned call from SylvaJessica and would like a return call  at  [phone] ,no crm noted

## 2017-09-09 NOTE — Telephone Encounter (Signed)
Spoke with George Fowler and he stated that he has been disabled since 2003. The George Fowler was put on disability by [facility] in [place] for an injury that occurred. George Fowler stated that he dropped a 2 ton beam on his foot and it crushed his foot. He stated that he has rods and screws in his foot and he can't stand on his foot to long without having a burning sensation and pain. 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 George Fowler stated that he has had the handicap sticker since 2003 and he usually gets it filled out by Dr. Dareen PianoAnderson but Dr. Dareen PianoAnderson has moved out of the state. George Fowler has signed a medical record release so we can get his records from [facility].

## 2017-09-09 NOTE — Telephone Encounter (Signed)
diability for work and disability for walking through a parking lot are 2 different things .  Since he does not walk with a cane or walker I do not feel comfortable signing a form for a handicapped parking space

## 2017-09-09 NOTE — Telephone Encounter (Signed)
Please advise 

## 2017-09-12 NOTE — Telephone Encounter (Signed)
Spoke with pt to let him know that Dr. Darrick Huntsmanullo was still not comfortable with signing the handicap placard because he does not need a walker nor a cane. The pt was not happy with the response that he got. Pt stated that we should know why he needs the handicap placard and that we have all the records from all of his doctors he has ever seen. Explained to the pt that we do not have any of his records from [facility] in regards to his foot injury. The pt stated that we needed to get his records and I explained to the pt that we have faxed the medical record release form that he signed. Pt was still not happy and stated that we would have to handle his handicap placard for. Pt stated that he would like to schedule an appt with Dr. Darrick Huntsmanullo to discuss things with her. Pt has been scheduled for 09/26/2017 at 9:30am. Pt is aware of appt date and time.

## 2017-09-12 NOTE — Telephone Encounter (Signed)
See previous message

## 2017-09-17 ENCOUNTER — Encounter: Payer: Self-pay | Admitting: Emergency Medicine

## 2017-09-17 ENCOUNTER — Ambulatory Visit
Admission: EM | Admit: 2017-09-17 | Discharge: 2017-09-17 | Disposition: A | Discharge status: Home / Self Care | Payer: Medicare Other | Attending: Family Medicine | Admitting: Family Medicine

## 2017-09-17 ENCOUNTER — Other Ambulatory Visit: Payer: Self-pay

## 2017-09-17 DIAGNOSIS — L03115 Cellulitis of right lower limb: Secondary | ICD-10-CM | POA: Diagnosis not present

## 2017-09-17 MED ORDER — SULFAMETHOXAZOLE-TRIMETHOPRIM 800-160 MG PO TABS: 1.0000 | ORAL_TABLET | Freq: Two times a day (BID) | ORAL | 0 refills | Status: DC

## 2017-09-17 NOTE — ED Triage Notes (Signed)
Patient in today c/o 2 week history of red, painful area on right shin which is getting worse. No injury noted and patient does not recall getting bitten by an insect.

## 2017-09-17 NOTE — ED Provider Notes (Signed)
[facility]    CSN: [id number] Arrival date & time: 09/17/17  1612     History   Chief Complaint Chief Complaint  Patient presents with  . Cellulitis    right shin    HPI George Fowler is a 52 y.o. male.   52 yo male with a c/o progressively worsening right lower leg redness, tenderness and slight swelling over the last 1-2 weeks. Denies any injuries, fevers, chills, drainage.   The history is provided by the patient.    Past Medical History:  Diagnosis Date  . Foot injury    partially disabled from  . Neuropathy of leg    Chronic; bilateral--thinks it was from working on hard floors and favoring right foot  . No pertinent past medical history   . Seasonal allergic rhinitis    with cough  . Umbilical hernia 11/14/2010   Repaired 09/25/11     Patient Active Problem List   Diagnosis Date Noted  . Not currently working due to disabled status 09/09/2017  . Prediabetes 08/07/2017  . Erectile dysfunction 08/07/2017  . Sequela of lacunar infarction 12/03/2016  . History of learning disability 12/03/2016  . Overweight (BMI 25.0-29.9) 12/03/2016  . Concussion with less than 1 hour loss of consciousness 10/31/2016  . Crushing injury of foot with toe, left, sequela 05/04/2016  . Hyperlipidemia LDL goal <100 04/17/2015  . Essential hypertension, benign 07/15/2014  . Allergic rhinitis 02/28/2014  . Eczema 11/20/2012  . Encounter for preventive health examination 05/27/2012  . Fungal toenail infection 11/25/2011  . Diastasis recti 08/23/2011  . PERIPHERAL NEUROPATHY, FEET 02/22/2010    Past Surgical History:  Procedure Laterality Date  . EYE SURGERY  2005   rt   . FOOT ARTHROPLASTY  2003   lt foot x3  . FOOT SURGERY  2003-lt   plate in big toe; pin in 2nd toe due to 2 ton beam falling on foot (2 surgeries on foot)  . UMBILICAL HERNIA REPAIR  09/25/2011   Procedure: HERNIA REPAIR UMBILICAL ADULT;  Surgeon: Currie Paris, MD;  Location: [facility];  Service: General;  Laterality: N/A;  umbilical hernia repair       Home Medications    Prior to Admission medications   Medication Sig Start Date End Date Taking? Authorizing Provider  amLODipine (NORVASC) 5 MG tablet TAKE 1 TABLET BY MOUTH DAILY. 05/27/17  Yes Sherlene Shams, MD  atorvastatin (LIPITOR) 20 MG tablet Take 1 tablet (20 mg total) by mouth daily. 01/03/17  Yes Sherlene Shams, MD  Calcium Carbonate-Vitamin D (CALCIUM 600+D) 600-400 MG-UNIT per tablet Take 1 tablet by mouth daily.    Yes [provider]  gabapentin (NEURONTIN) 300 MG capsule Take 3 capsules (900 mg total) by mouth 2 (two) times daily. 01/03/17  Yes Sherlene Shams, MD  Vitamin A 40981 units TABS Take 1 tablet by mouth daily.   Yes [provider]  vitamin B-12 (CYANOCOBALAMIN) 500 MCG tablet Take 500 mcg by mouth 2 (two) times daily.     Yes [provider]  sildenafil (REVATIO) 20 MG tablet Take 1 tablet (20 mg total) by mouth 3 (three) times daily. 08/06/17   Sherlene Shams, MD  sulfamethoxazole-trimethoprim (BACTRIM DS,SEPTRA DS) 800-160 MG tablet Take 1 tablet by mouth 2 (two) times daily. 09/17/17   Payton Mccallum, MD    Family History Family History  Problem Relation Age of Onset  . Hypertension Father   . Hypertension Mother   .  Diabetes Mother   . Heart disease Paternal Grandmother   . Heart disease Paternal Grandfather   . Cancer Unknown        uncle---unsure of what kind    Social History Social History   Tobacco Use  . Smoking status: Former Smoker    Last attempt to quit: 03/18/1990    Years since quitting: 27.5  . Smokeless tobacco: Never Used  . Tobacco comment: quit after smoking x 3 years---in his 20's  Substance Use Topics  . Alcohol use: Yes    Alcohol/week: 0.6 - 1.2 oz    Types: 1 - 2 Cans of beer per week    Comment: 1-2 drinks at night on the weekend  . Drug use: No     Allergies   Penicillins   Review of Systems Review of  Systems   Physical Exam Triage Vital Signs ED Triage Vitals [09/17/17 1625]  Enc Vitals Group     BP (!) 141/101     Pulse Rate 84     Resp 16     Temp 98.2 F (36.8 C)     Temp Source Oral     SpO2 97 %     Weight 185 lb (83.9 kg)     Height 5\' 10"  (1.778 m)     Head Circumference      Peak Flow      Pain Score 0     Pain Loc      Pain Edu?      Excl. in GC?    No data found.  Updated Vital Signs BP (!) 141/101 (BP Location: Left Arm)   Pulse 84   Temp 98.2 F (36.8 C) (Oral)   Resp 16   Ht 5\' 10"  (1.778 m)   Wt 185 lb (83.9 kg)   SpO2 97%   BMI 26.54 kg/m   Visual Acuity Right Eye Distance:   Left Eye Distance:   Bilateral Distance:    Right Eye Near:   Left Eye Near:    Bilateral Near:     Physical Exam  Constitutional: He appears well-developed and well-nourished. No distress.  Skin: Rash noted. He is not diaphoretic. There is erythema.  Right lower anterior leg (shin area) with blanchable erythema, warmth and tenderness to palpation; no drainage  Nursing note and vitals reviewed.    UC Treatments / Results  Labs (all labs ordered are listed, but only abnormal results are displayed) Labs Reviewed - No data to display  EKG None  Radiology No results found.  Procedures Procedures (including critical care time)  Medications Ordered in UC Medications - No data to display  Initial Impression / Assessment and Plan / UC Course  I have reviewed the triage vital signs and the nursing notes.  Pertinent labs & imaging results that were available during my care of the patient were reviewed by me and considered in my medical decision making (see chart for details).      Final Clinical Impressions(s) / UC Diagnoses   Final diagnoses:  Cellulitis of right lower extremity     Discharge Instructions     Warm compresses to area    ED Prescriptions    Medication Sig Dispense Auth. Provider   sulfamethoxazole-trimethoprim (BACTRIM  DS,SEPTRA DS) 800-160 MG tablet Take 1 tablet by mouth 2 (two) times daily. 20 tablet Payton Mccallum, MD     1. diagnosis reviewed with patient 2. rx as per orders above; reviewed possible side effects, interactions, risks and benefits  3. Recommend supportive treatment as above and elevation 4. Follow-up prn if symptoms worsen or don't improve Controlled Substance Prescriptions [place] Controlled Substance Registry consulted? Not Applicable   Payton Mccallumonty, Mekhai Venuto, MD 09/17/17 1700

## 2017-09-17 NOTE — Discharge Instructions (Signed)
Warm compresses to area °

## 2017-09-18 ENCOUNTER — Ambulatory Visit
Admission: EM | Admit: 2017-09-18 | Discharge: 2017-09-18 | Disposition: A | Discharge status: Home / Self Care | Payer: Medicare Other | Attending: Family Medicine | Admitting: Family Medicine

## 2017-09-18 ENCOUNTER — Encounter: Payer: Self-pay | Admitting: Emergency Medicine

## 2017-09-18 DIAGNOSIS — L03115 Cellulitis of right lower limb: Secondary | ICD-10-CM | POA: Diagnosis not present

## 2017-09-18 MED ORDER — MUPIROCIN 2 % EX OINT: 1.0000 "application " | TOPICAL_OINTMENT | Freq: Two times a day (BID) | CUTANEOUS | 0 refills | Status: AC

## 2017-09-18 MED ORDER — DOXYCYCLINE HYCLATE 100 MG PO CAPS: 100.0000 mg | ORAL_CAPSULE | Freq: Two times a day (BID) | ORAL | 0 refills | Status: DC

## 2017-09-18 NOTE — Discharge Instructions (Signed)
Rest, elevate.  Topical antibiotic as prescribed.  Stop Bactrim. Start Doxycycline. Be cautious with a lot of sunlight, as it makes people photosensitive.  Take with food.  Take care  Dr. Adriana Simasook

## 2017-09-18 NOTE — ED Provider Notes (Signed)
[facility]   CSN: [id number] Arrival date & time: 09/18/17  1220  History   Chief Complaint Chief Complaint  Patient presents with  . Leg Pain  . Wound Check   HPI  52 year old male presents for reevaluation regarding right lower leg cellulitis.  Patient was seen yesterday.  Diagnosed with cellulitis.  Treated with Bactrim.  Patient presents today complaining that it appears to be worse.  No worsening pain.  He states that it does not appear to be spreading.  His concern is the fact that it is darker in coloration.  It is bright red.  Tender to palpation.  He said no fever.  No drainage.  He has had no improvement with the antibiotic use thus far.  No other reported symptoms.  No other complaints.  Past Medical History:  Diagnosis Date  . Foot injury    partially disabled from  . Neuropathy of leg    Chronic; bilateral--thinks it was from working on hard floors and favoring right foot  . No pertinent past medical history   . Seasonal allergic rhinitis    with cough  . Umbilical hernia 11/14/2010   Repaired 09/25/11    Patient Active Problem List   Diagnosis Date Noted  . Not currently working due to disabled status 09/09/2017  . Prediabetes 08/07/2017  . Erectile dysfunction 08/07/2017  . Sequela of lacunar infarction 12/03/2016  . History of learning disability 12/03/2016  . Overweight (BMI 25.0-29.9) 12/03/2016  . Concussion with less than 1 hour loss of consciousness 10/31/2016  . Crushing injury of foot with toe, left, sequela 05/04/2016  . Hyperlipidemia LDL goal <100 04/17/2015  . Essential hypertension, benign 07/15/2014  . Allergic rhinitis 02/28/2014  . Eczema 11/20/2012  . Encounter for preventive health examination 05/27/2012  . Fungal toenail infection 11/25/2011  . Diastasis recti 08/23/2011  . PERIPHERAL NEUROPATHY, FEET 02/22/2010   Past Surgical History:  Procedure Laterality Date  . EYE SURGERY  2005   rt   . FOOT ARTHROPLASTY  2003   lt foot x3  . FOOT SURGERY  2003-lt   plate in big toe; pin in 2nd toe due to 2 ton beam falling on foot (2 surgeries on foot)  . UMBILICAL HERNIA REPAIR  09/25/2011   Procedure: HERNIA REPAIR UMBILICAL ADULT;  Surgeon: Currie Parishristian J Streck, MD;  Location: [facility];  Service: General;  Laterality: N/A;  umbilical hernia repair   Home Medications    Prior to Admission medications   Medication Sig Start Date End Date Taking? Authorizing Provider  amLODipine (NORVASC) 5 MG tablet TAKE 1 TABLET BY MOUTH DAILY. 05/27/17  Yes Sherlene Shamsullo, Teresa L, MD  atorvastatin (LIPITOR) 20 MG tablet Take 1 tablet (20 mg total) by mouth daily. 01/03/17  Yes Sherlene Shamsullo, Teresa L, MD  Calcium Carbonate-Vitamin D (CALCIUM 600+D) 600-400 MG-UNIT per tablet Take 1 tablet by mouth daily.    Yes [provider]  gabapentin (NEURONTIN) 300 MG capsule Take 3 capsules (900 mg total) by mouth 2 (two) times daily. 01/03/17  Yes Sherlene Shamsullo, Teresa L, MD  sildenafil (REVATIO) 20 MG tablet Take 1 tablet (20 mg total) by mouth 3 (three) times daily. 08/06/17  Yes Sherlene Shamsullo, Teresa L, MD  Vitamin A 6578410000 units TABS Take 1 tablet by mouth daily.   Yes [provider]  vitamin B-12 (CYANOCOBALAMIN) 500 MCG tablet Take 500 mcg by mouth 2 (two) times daily.     Yes [provider]  doxycycline (VIBRAMYCIN) 100 MG  capsule Take 1 capsule (100 mg total) by mouth 2 (two) times daily. 09/18/17   Tommie Sams, DO  mupirocin ointment (BACTROBAN) 2 % Apply 1 application topically 2 (two) times daily for 7 days. 09/18/17 09/25/17  Tommie Sams, DO   Family History Family History  Problem Relation Age of Onset  . Hypertension Father   . Hypertension Mother   . Diabetes Mother   . Heart disease Paternal Grandmother   . Heart disease Paternal Grandfather   . Cancer Unknown        uncle---unsure of what kind   Social History Social History   Tobacco Use  . Smoking status: Former Smoker    Last attempt to quit:  03/18/1990    Years since quitting: 27.5  . Smokeless tobacco: Never Used  . Tobacco comment: quit after smoking x 3 years---in his 20's  Substance Use Topics  . Alcohol use: Yes    Alcohol/week: 0.6 - 1.2 oz    Types: 1 - 2 Cans of beer per week    Comment: 1-2 drinks at night on the weekend  . Drug use: No   Allergies   Penicillins  Review of Systems Review of Systems  Constitutional: Negative.   Skin:       R lower leg redness, warmth, pain.   Physical Exam Triage Vital Signs ED Triage Vitals  Enc Vitals Group     BP 09/18/17 1233 130/77     Pulse Rate 09/18/17 1233 77     Resp 09/18/17 1233 16     Temp 09/18/17 1233 97.8 F (36.6 C)     Temp Source 09/18/17 1233 Oral     SpO2 09/18/17 1233 98 %     Weight 09/18/17 1231 182 lb (82.6 kg)     Height 09/18/17 1231 5\' 10"  (1.778 m)     Head Circumference --      Peak Flow --      Pain Score 09/18/17 1230 0     Pain Loc --      Pain Edu? --      Excl. in GC? --    Updated Vital Signs BP 130/77 (BP Location: Left Arm)   Pulse 77   Temp 97.8 F (36.6 C) (Oral)   Resp 16   Ht 5\' 10"  (1.778 m)   Wt 182 lb (82.6 kg)   SpO2 98%   BMI 26.11 kg/m   Physical Exam  Constitutional: He is oriented to person, place, and time. He appears well-developed. No distress.  HENT:  Head: Normocephalic and atraumatic.  Pulmonary/Chest: Effort normal. No respiratory distress.  Neurological: He is alert and oriented to person, place, and time.  Skin:  Right lower leg -patient with an area of erythema, warmth, tenderness.  No fluctuance.  Area was marked with a pen.  Psychiatric: He has a normal mood and affect. His behavior is normal.  Nursing note and vitals reviewed.  UC Treatments / Results  Labs (all labs ordered are listed, but only abnormal results are displayed) Labs Reviewed - No data to display  EKG None  Radiology No results found.  Procedures Procedures (including critical care time)  Medications Ordered in  UC Medications - No data to display  Initial Impression / Assessment and Plan / UC Course  I have reviewed the triage vital signs and the nursing notes.  Pertinent labs & imaging results that were available during my care of the patient were reviewed by me and considered in  my medical decision making (see chart for details).    52 year old male presents with worsening cellulitis.  Stopping Bactrim.  Starting doxycycline.  Adding mupirocin.  Supportive care.  Final Clinical Impressions(s) / UC Diagnoses   Final diagnoses:  Cellulitis of right lower extremity     Discharge Instructions     Rest, elevate.  Topical antibiotic as prescribed.  Stop Bactrim. Start Doxycycline. Be cautious with a lot of sunlight, as it makes people photosensitive.  Take with food.  Take care  Dr. Adriana Simas    ED Prescriptions    Medication Sig Dispense Auth. Provider   doxycycline (VIBRAMYCIN) 100 MG capsule Take 1 capsule (100 mg total) by mouth 2 (two) times daily. 14 capsule Jayliana Valencia G, DO   mupirocin ointment (BACTROBAN) 2 % Apply 1 application topically 2 (two) times daily for 7 days. 22 g Tommie Sams, DO     Controlled Substance Prescriptions [place] Controlled Substance Registry consulted? Not Applicable   Tommie Sams, DO 09/18/17 1308

## 2017-09-18 NOTE — ED Triage Notes (Addendum)
Patient c/o redness and pain in his right lower leg that started over a week ago. Patient currently on Bactrim for this. Patient denies fevers.

## 2017-09-19 ENCOUNTER — Emergency Department (HOSPITAL_COMMUNITY)
Admission: EM | Admit: 2017-09-19 | Discharge: 2017-09-19 | Disposition: A | Discharge status: Home / Self Care | Payer: Medicare Other | Attending: Emergency Medicine | Admitting: Emergency Medicine

## 2017-09-19 ENCOUNTER — Emergency Department (HOSPITAL_COMMUNITY): Payer: Medicare Other

## 2017-09-19 ENCOUNTER — Other Ambulatory Visit: Payer: Self-pay

## 2017-09-19 ENCOUNTER — Encounter (HOSPITAL_COMMUNITY): Payer: Self-pay | Admitting: Emergency Medicine

## 2017-09-19 DIAGNOSIS — Z79899 Other long term (current) drug therapy: Secondary | ICD-10-CM | POA: Diagnosis not present

## 2017-09-19 DIAGNOSIS — Z87891 Personal history of nicotine dependence: Secondary | ICD-10-CM | POA: Insufficient documentation

## 2017-09-19 DIAGNOSIS — M7989 Other specified soft tissue disorders: Secondary | ICD-10-CM | POA: Diagnosis not present

## 2017-09-19 DIAGNOSIS — L03115 Cellulitis of right lower limb: Secondary | ICD-10-CM | POA: Diagnosis not present

## 2017-09-19 NOTE — Discharge Instructions (Addendum)
Continue with the Doxycycline and ointment. Monitor area, return for any worsening or concerning symptoms. Otherwise, see your doctor as scheduled, sooner if possible.

## 2017-09-19 NOTE — ED Provider Notes (Signed)
[facility] Provider Note   CSN: [id number] Arrival date & time: 09/19/17  1033     History   Chief Complaint Chief Complaint  Patient presents with  . Wound Infection  . Cellulitis    HPI George Fowler is a 52 y.o. male.  52 year old male presents with complaint of right lower leg infection.  Patient states that he first noticed pain in his right lower leg with bearing weight or movement of his foot.  Patient then noticed that he was getting a red area to the anterior right lower leg.  Patient went to urgent care and was given Bactrim 2 days ago.  Patient took 2 doses of the Bactrim, woke up yesterday morning and noticed that the area appeared darker so he returned to urgent care and was sized to stop the Bactrim and start doxycycline.  Patient took his dose of doxycycline last night, took an additional dose of doxycycline this morning, has also been applying the person ointment to the area.  Patient and his mother noticed that the center of the wound appeared black this morning which prompted this morning's visit to the emergency room.  Patient states the right lower leg is also swollen, the swelling as well as the black or dark center appearance of the wound have improved as of this time.  Patient states that he tripped in a parking lot about 6 weeks ago and had a wound to his right knee, unsure if he may have injured or scraped the right lower leg at that time.  Patient has a history of peripheral neuropathy, also states that he had a severe sunburn 2006, also states he had an incident where he being slid down both of his anterior lower legs and crushed his left foot several years ago.  No other injuries, complaints, concerns.     Past Medical History:  Diagnosis Date  . Foot injury    partially disabled from  . Neuropathy of leg    Chronic; bilateral--thinks it was from working on hard floors and favoring right foot  . No pertinent past  medical history   . Seasonal allergic rhinitis    with cough  . Umbilical hernia 11/14/2010   Repaired 09/25/11     Patient Active Problem List   Diagnosis Date Noted  . Not currently working due to disabled status 09/09/2017  . Prediabetes 08/07/2017  . Erectile dysfunction 08/07/2017  . Sequela of lacunar infarction 12/03/2016  . History of learning disability 12/03/2016  . Overweight (BMI 25.0-29.9) 12/03/2016  . Concussion with less than 1 hour loss of consciousness 10/31/2016  . Crushing injury of foot with toe, left, sequela 05/04/2016  . Hyperlipidemia LDL goal <100 04/17/2015  . Essential hypertension, benign 07/15/2014  . Allergic rhinitis 02/28/2014  . Eczema 11/20/2012  . Encounter for preventive health examination 05/27/2012  . Fungal toenail infection 11/25/2011  . Diastasis recti 08/23/2011  . PERIPHERAL NEUROPATHY, FEET 02/22/2010    Past Surgical History:  Procedure Laterality Date  . EYE SURGERY  2005   rt   . FOOT ARTHROPLASTY  2003   lt foot x3  . FOOT SURGERY  2003-lt   plate in big toe; pin in 2nd toe due to 2 ton beam falling on foot (2 surgeries on foot)  . UMBILICAL HERNIA REPAIR  09/25/2011   Procedure: HERNIA REPAIR UMBILICAL ADULT;  Surgeon: Currie Parishristian J Streck, MD;  Location: [facility];  Service: General;  Laterality: N/A;  umbilical hernia repair        Home Medications    Prior to Admission medications   Medication Sig Start Date End Date Taking? Authorizing Provider  amLODipine (NORVASC) 5 MG tablet TAKE 1 TABLET BY MOUTH DAILY. 05/27/17   Sherlene Shams, MD  atorvastatin (LIPITOR) 20 MG tablet Take 1 tablet (20 mg total) by mouth daily. 01/03/17   Sherlene Shams, MD  Calcium Carbonate-Vitamin D (CALCIUM 600+D) 600-400 MG-UNIT per tablet Take 1 tablet by mouth daily.     [provider]  doxycycline (VIBRAMYCIN) 100 MG capsule Take 1 capsule (100 mg total) by mouth 2 (two) times daily. 09/18/17   Tommie Sams, DO    gabapentin (NEURONTIN) 300 MG capsule Take 3 capsules (900 mg total) by mouth 2 (two) times daily. 01/03/17   Sherlene Shams, MD  mupirocin ointment (BACTROBAN) 2 % Apply 1 application topically 2 (two) times daily for 7 days. 09/18/17 09/25/17  Tommie Sams, DO  sildenafil (REVATIO) 20 MG tablet Take 1 tablet (20 mg total) by mouth 3 (three) times daily. 08/06/17   Sherlene Shams, MD  Vitamin A 16109 units TABS Take 1 tablet by mouth daily.    [provider]  vitamin B-12 (CYANOCOBALAMIN) 500 MCG tablet Take 500 mcg by mouth 2 (two) times daily.      [provider]    Family History Family History  Problem Relation Age of Onset  . Hypertension Father   . Hypertension Mother   . Diabetes Mother   . Heart disease Paternal Grandmother   . Heart disease Paternal Grandfather   . Cancer Unknown        uncle---unsure of what kind    Social History Social History   Tobacco Use  . Smoking status: Former Smoker    Last attempt to quit: 03/18/1990    Years since quitting: 27.5  . Smokeless tobacco: Never Used  . Tobacco comment: quit after smoking x 3 years---in his 20's  Substance Use Topics  . Alcohol use: Yes    Alcohol/week: 0.6 - 1.2 oz    Types: 1 - 2 Cans of beer per week    Comment: 1-2 drinks at night on the weekend  . Drug use: No     Allergies   Penicillins   Review of Systems Review of Systems  Constitutional: Negative for chills and fever.  Musculoskeletal: Positive for myalgias. Negative for arthralgias.  Skin: Positive for color change and wound.  Allergic/Immunologic: Negative for immunocompromised state.  Neurological: Negative for weakness and numbness.  Hematological: Negative for adenopathy. Does not bruise/bleed easily.  Psychiatric/Behavioral: Negative for confusion.  All other systems reviewed and are negative.    Physical Exam Updated Vital Signs BP (!) 141/80   Pulse 63   Temp 98.8 F (37.1 C) (Oral)   Resp 16   Ht 5\' 10"   (1.778 m)   Wt 82.6 kg (182 lb)   SpO2 97%   BMI 26.11 kg/m   Physical Exam  Constitutional: He is oriented to person, place, and time. He appears well-developed and well-nourished.  Cardiovascular: Intact distal pulses.  Pulmonary/Chest: Effort normal.  Musculoskeletal: Normal range of motion. He exhibits tenderness. He exhibits no deformity.  Neurological: He is alert and oriented to person, place, and time.  Skin: Skin is warm and dry. Capillary refill takes less than 2 seconds. Rash noted. There is erythema.     Psychiatric: He has a normal mood and affect. His behavior  is normal.  Nursing note and vitals reviewed.    ED Treatments / Results  Labs (all labs ordered are listed, but only abnormal results are displayed) Labs Reviewed - No data to display  EKG None  Radiology Dg Tibia/fibula Right  Result Date: 09/19/2017 CLINICAL DATA:  Infection/cellulitis. EXAM: RIGHT TIBIA AND FIBULA - 2 VIEW COMPARISON:  No recent prior. FINDINGS: No acute bony or joint abnormality. No evidence of fracture or dislocation. Diffuse soft tissue swelling. IMPRESSION: Diffuse soft tissue swelling.  No acute bony or joint abnormality. Electronically Signed   By: Maisie Fus  Register   On: 09/19/2017 13:21    Procedures Procedures (including critical care time)  Medications Ordered in ED Medications - No data to display   Initial Impression / Assessment and Plan / ED Course  I have reviewed the triage vital signs and the nursing notes.  Pertinent labs & imaging results that were available during my care of the patient were reviewed by me and considered in my medical decision making (see chart for details).  Clinical Course as of Sep 19 1416  Fri Sep 19, 2017  1350 52 yo male presents for recheck cellulitis right lower leg. Patient was seen initially 7/3 and given Bactrim for right lower leg cellulitis, area became "darker red " and returned to UC on 7/4 advised to stop the Bactrim and was  given Doxycycline. Patient and mother noticed increase in redness in the center and came to the Er. Patient has a picture of area from this morning- notes a "black" area in the center of the effected area which has since improved compared to now. Redness does not extend beyond the outlined area. XR is negative for osteomyelitis, shows soft tissue swelling. As area is improving, recommend patient continue with the Doxy and ointment, see PCP as scheduled for continuing care/monitoring of area. Return to the ER for any worsening or concerning symptoms. Discussed expected course- should continue to improve over the next 48-72 hours. Possible brown recluse bite as the area in the center was described as black by patient and his family, the black area has improved as of this time but continue to monitor.   [LM]    Clinical Course User Index [LM] Jeannie Fend, PA-C    Final Clinical Impressions(s) / ED Diagnoses   Final diagnoses:  Cellulitis of right leg    ED Discharge Orders    None       Jeannie Fend, PA-C 09/19/17 1418    Terrilee Files, MD 09/20/17 [phone]

## 2017-09-19 NOTE — ED Triage Notes (Signed)
Pt. Stated, I fell about 3 months ago and landed on my leg and this place on my leg has not healed. It looks worse. I think I have cellulitis

## 2017-09-19 NOTE — ED Triage Notes (Signed)
[name] stated, George Atlasve been to 2 different places and [facility] and told to come back if the marks are outside the markers.

## 2017-09-19 NOTE — ED Notes (Signed)
Pt returned from xray

## 2017-09-24 DIAGNOSIS — M79671 Pain in right foot: Secondary | ICD-10-CM | POA: Diagnosis not present

## 2017-09-24 DIAGNOSIS — M79672 Pain in left foot: Secondary | ICD-10-CM | POA: Diagnosis not present

## 2017-09-26 ENCOUNTER — Ambulatory Visit (INDEPENDENT_AMBULATORY_CARE_PROVIDER_SITE_OTHER): Payer: Medicare Other | Admitting: Internal Medicine

## 2017-09-26 ENCOUNTER — Encounter: Payer: Self-pay | Admitting: Internal Medicine

## 2017-09-26 DIAGNOSIS — L03115 Cellulitis of right lower limb: Secondary | ICD-10-CM | POA: Diagnosis not present

## 2017-09-26 DIAGNOSIS — I10 Essential (primary) hypertension: Secondary | ICD-10-CM | POA: Diagnosis not present

## 2017-09-26 DIAGNOSIS — I872 Venous insufficiency (chronic) (peripheral): Secondary | ICD-10-CM | POA: Diagnosis not present

## 2017-09-26 DIAGNOSIS — L02415 Cutaneous abscess of right lower limb: Secondary | ICD-10-CM

## 2017-09-26 NOTE — Progress Notes (Signed)
Subjective:  Patient ID: George Fowler, male    DOB: April 14, 1965  Age: 52 y.o. MRN: [id number]  CC: Diagnoses of Essential hypertension, benign, Cellulitis and abscess of right leg, and Venous insufficiency of both lower extremities were pertinent to this visit.  HPI George Fowler presents for follow up on multiple issues including hypertension  Cc: recent episode of cellulitis involving the right tibia above the ankle. He suffered a right knee abrasion 6 weeks ago when he fell onto concrete while walking from parkinglot to a roadway . Several days later he developed an erythematous warm patch on  his lower tibial blade over his surgical scar that developed into an apparent cellulitis. Marland Kitchen.  He was treated at Urgent Care leg  On July 3,  Given bactrim and mupiricin , which  Was changed after 24 hours to doxycycline .  Which he finished 2 days ago .  Serial photographs of the lower leg taken by pateint with his cellular phone area show gradual improvement  In the area of redness.   He has a Remote crush injury to the right  foot which occurred in 2003 requiring surgical correction and hardware.  He  Has early venous stasis changes to the left lower leg which was discussed today.   Outpatient Medications Prior to Visit  Medication Sig Dispense Refill  . atorvastatin (LIPITOR) 20 MG tablet Take 1 tablet (20 mg total) by mouth daily. 90 tablet 2  . Calcium Carbonate-Vitamin D (CALCIUM 600+D) 600-400 MG-UNIT per tablet Take 1 tablet by mouth daily.     Marland Kitchen. gabapentin (NEURONTIN) 300 MG capsule Take 3 capsules (900 mg total) by mouth 2 (two) times daily. 540 capsule 3  . sildenafil (REVATIO) 20 MG tablet Take 1 tablet (20 mg total) by mouth 3 (three) times daily. 10 tablet 0  . Vitamin A 4098110000 units TABS Take 1 tablet by mouth daily.    . vitamin B-12 (CYANOCOBALAMIN) 500 MCG tablet Take 500 mcg by mouth 2 (two) times daily.      Marland Kitchen. amLODipine (NORVASC) 5 MG tablet TAKE 1 TABLET BY MOUTH DAILY. 90  tablet 3  . doxycycline (VIBRAMYCIN) 100 MG capsule Take 1 capsule (100 mg total) by mouth 2 (two) times daily. 14 capsule 0   No facility-administered medications prior to visit.     Review of Systems;  Patient denies headache, fevers, malaise, unintentional weight loss, skin rash, eye pain, sinus congestion and sinus pain, sore throat, dysphagia,  hemoptysis , cough, dyspnea, wheezing, chest pain, palpitations, orthopnea, edema, abdominal pain, nausea, melena, diarrhea, constipation, flank pain, dysuria, hematuria, urinary  Frequency, nocturia, numbness, tingling, seizures,  Focal weakness, Loss of consciousness,  Tremor, insomnia, depression, anxiety, and suicidal ideation.      Objective:  BP 134/90   Pulse 75   Temp (!) 97.4 F (36.3 C) (Oral)   Ht 5\' 9"  (1.753 m)   Wt 189 lb (85.7 kg)   SpO2 98%   BMI 27.91 kg/m   BP Readings from Last 3 Encounters:  09/26/17 134/90  09/19/17 (!) 141/80  09/18/17 130/77    Wt Readings from Last 3 Encounters:  09/26/17 189 lb (85.7 kg)  09/19/17 182 lb (82.6 kg)  09/18/17 182 lb (82.6 kg)    General appearance: alert, cooperative and appears stated age Ears: normal TM's and external ear canals both ears Throat: lips, mucosa, and tongue normal; teeth and gums normal Neck: no adenopathy, no carotid bruit, supple, symmetrical, trachea midline and thyroid not  enlarged, symmetric, no tenderness/mass/nodules Back: symmetric, no curvature. ROM normal. No CVA tenderness. Lungs: clear to auscultation bilaterally Heart: regular rate and rhythm, S1, S2 normal, no murmur, click, rub or gallop Abdomen: soft, non-tender; bowel sounds normal; no masses,  no organomegaly Pulses: 2+ and symmetric Skin: Skin color, texture, turgor normal. No rashes or lesions Lymph nodes: Cervical, supraclavicular, and axillary nodes normal.  Lab Results  Component Value Date   HGBA1C 5.8 (A) 08/06/2017   HGBA1C 5.8 12/02/2016   HGBA1C 5.7 03/22/2016    Lab  Results  Component Value Date   CREATININE 1.30 07/31/2017   CREATININE 1.28 01/01/2017   CREATININE 1.32 10/30/2016    Lab Results  Component Value Date   WBC 9.6 10/30/2016   HGB 15.3 10/30/2016   HCT 46.1 10/30/2016   PLT 187.0 10/30/2016   GLUCOSE 100 (H) 07/31/2017   CHOL 138 07/31/2017   TRIG 123.0 07/31/2017   HDL 37.80 (L) 07/31/2017   LDLDIRECT 135.0 12/02/2016   LDLCALC 76 07/31/2017   ALT 23 07/31/2017   AST 19 07/31/2017   NA 141 07/31/2017   K 4.5 07/31/2017   CL 106 07/31/2017   CREATININE 1.30 07/31/2017   BUN 15 07/31/2017   CO2 29 07/31/2017   TSH 1.03 07/31/2017   HGBA1C 5.8 (A) 08/06/2017    Dg Tibia/fibula Right  Result Date: 09/19/2017 CLINICAL DATA:  Infection/cellulitis. EXAM: RIGHT TIBIA AND FIBULA - 2 VIEW COMPARISON:  No recent prior. FINDINGS: No acute bony or joint abnormality. No evidence of fracture or dislocation. Diffuse soft tissue swelling. IMPRESSION: Diffuse soft tissue swelling.  No acute bony or joint abnormality. Electronically Signed   By: Maisie Fus  Register   On: 09/19/2017 13:21    Assessment & Plan:   Problem List Items Addressed This Visit    Cellulitis and abscess of right leg    Occurring after minor trauma to knee after a fall.  Resolving after treatment with doxycycline      Essential hypertension, benign    Currently managed with amlodipine which may be aggravating his edema given concurrent use of gabepentin  ,  Will recommend change to losartan        Relevant Medications   losartan-hydrochlorothiazide (HYZAAR) 50-12.5 MG tablet   Venous insufficiency of both lower extremities    secondary to crush injury of feet in 2003.  Compression knee highs advised,   And change in BP medication to losartan hct      Relevant Medications   losartan-hydrochlorothiazide (HYZAAR) 50-12.5 MG tablet    A total of 25 minutes of face to face time was spent with patient more than half of which was spent in counselling about the above  mentioned conditions  and coordination of care  I have discontinued Markies Mowatt. Diodato's amLODipine and doxycycline. I am also having him start on losartan-hydrochlorothiazide. Additionally, I am having him maintain his Calcium Carbonate-Vitamin D, vitamin B-12, gabapentin, atorvastatin, Vitamin A, and sildenafil.  Meds ordered this encounter  Medications  . losartan-hydrochlorothiazide (HYZAAR) 50-12.5 MG tablet    Sig: Take 1 tablet by mouth daily.    Dispense:  90 tablet    Refill:  3    Medications Discontinued During This Encounter  Medication Reason  . doxycycline (VIBRAMYCIN) 100 MG capsule Completed Course  . amLODipine (NORVASC) 5 MG tablet     Follow-up: No follow-ups on file.   Sherlene Shams, MD

## 2017-09-26 NOTE — Patient Instructions (Addendum)
Your cellulitis is resolving .  You do not need any more antibiotics.  You should elevate the leg and apply ice to the red area (wrapped in a towel   for 15 minutes several times per day )  You have "venous insufficiency"  Involving the veins in your legs.  This occurred because of the trauma to your feet in 2003  Wearing compression stockings (knee highs) will prevent the daily swelling,  And prevent  discoloration that occurs over  Time (hemosiderin deposits that stain your leg like "rust")  [email or web address]  Is a great website to purchase these knee high stockings    Chronic Venous Insufficiency Chronic venous insufficiency, also called venous stasis, is a condition that prevents blood from being pumped effectively through the veins in your legs. Blood may no longer be pumped effectively from the legs back to the heart. This condition can range from mild to severe. With proper treatment, you should be able to continue with an active life. What are the causes? Chronic venous insufficiency occurs when the vein walls become stretched, weakened, or damaged, or when valves within the vein are damaged. Some common causes of this include:  High blood pressure inside the veins (venous hypertension).  Increased blood pressure in the leg veins from long periods of sitting or standing.  A blood clot that blocks blood flow in a vein (deep vein thrombosis, DVT).  Inflammation of a vein (phlebitis) that causes a blood clot to form.  Tumors in the pelvis that cause blood to back up.  What increases the risk? The following factors may make you more likely to develop this condition:  Having a family history of this condition.  Obesity.  Pregnancy.  Living without enough physical activity or exercise (sedentary lifestyle).  Smoking.  Having a job that requires long periods of standing or sitting in one place.  Being a certain age. Women in their 26s and 77s and men in their 43s are more likely  to develop this condition.  What are the signs or symptoms? Symptoms of this condition include:  Veins that are enlarged, bulging, or twisted (varicose veins).  Skin breakdown or ulcers.  Reddened or discolored skin on the front of the leg.  Brown, smooth, tight, and painful skin just above the ankle, usually on the inside of the leg (lipodermatosclerosis).  Swelling.  How is this diagnosed? This condition may be diagnosed based on:  Your medical history.  A physical exam.  Tests, such as: ? A procedure that creates an image of a blood vessel and nearby organs and provides information about blood flow through the blood vessel (duplex ultrasound). ? A procedure that tests blood flow (plethysmography). ? A procedure to look at the veins using X-ray and dye (venogram).  How is this treated? The goals of treatment are to help you return to an active life and to minimize pain or disability. Treatment depends on the severity of your condition, and it may include:  Wearing compression stockings. These can help relieve symptoms and help prevent your condition from getting worse. However, they do not cure the condition.  Sclerotherapy. This is a procedure involving an injection of a material that "dissolves" damaged veins.  Surgery. This may involve: ? Removing a diseased vein (vein stripping). ? Cutting off blood flow through the vein (laser ablation surgery). ? Repairing a valve.  Follow these instructions at home:  Wear compression stockings as told by your health care provider. These stockings help to prevent blood  clots and reduce swelling in your legs.  Take over-the-counter and prescription medicines only as told by your health care provider.  Stay active by exercising, walking, or doing different activities. Ask your health care provider what activities are safe for you and how much exercise you need.  Drink enough fluid to keep your urine clear or pale yellow.  Do not  use any products that contain nicotine or tobacco, such as cigarettes and e-cigarettes. If you need help quitting, ask your health care provider.  Keep all follow-up visits as told by your health care provider. This is important. Contact a health care provider if:  You have redness, swelling, or more pain in the affected area.  You see a red streak or line that extends up or down from the affected area.  You have skin breakdown or a loss of skin in the affected area, even if the breakdown is small.  You get an injury in the affected area. Get help right away if:  You get an injury and an open wound in the affected area.  You have severe pain that does not get better with medicine.  You have sudden numbness or weakness in the foot or ankle below the affected area, or you have trouble moving your foot or ankle.  You have a fever and you have worse or persistent symptoms.  You have chest pain.  You have shortness of breath. Summary  Chronic venous insufficiency, also called venous stasis, is a condition that prevents blood from being pumped effectively through the veins in your legs.  Chronic venous insufficiency occurs when the vein walls become stretched, weakened, or damaged, or when valves within the vein are damaged.  Treatment for this condition depends on how severe your condition is, and it may involve wearing compression stockings or having a procedure.  Make sure you stay active by exercising, walking, or doing different activities. Ask your health care provider what activities are safe for you and how much exercise you need. This information is not intended to replace advice given to you by your health care provider. Make sure you discuss any questions you have with your health care provider. Document Released: 07/08/2006 Document Revised: 01/22/2016 Document Reviewed: 01/22/2016 Elsevier Interactive Patient Education  2017 [employer]

## 2017-09-28 DIAGNOSIS — L02415 Cutaneous abscess of right lower limb: Secondary | ICD-10-CM | POA: Insufficient documentation

## 2017-09-28 DIAGNOSIS — I872 Venous insufficiency (chronic) (peripheral): Secondary | ICD-10-CM | POA: Insufficient documentation

## 2017-09-28 DIAGNOSIS — L03115 Cellulitis of right lower limb: Secondary | ICD-10-CM

## 2017-09-28 MED ORDER — LOSARTAN POTASSIUM-HCTZ 50-12.5 MG PO TABS: 1.0000 | ORAL_TABLET | Freq: Every day | ORAL | 3 refills | Status: DC

## 2017-09-28 NOTE — Assessment & Plan Note (Addendum)
secondary to crush injury of feet in 2003.  Compression knee highs advised,   And change in BP medication to losartan hct

## 2017-09-28 NOTE — Assessment & Plan Note (Signed)
Occurring after minor trauma to knee after a fall.  Resolving after treatment with doxycycline

## 2017-09-28 NOTE — Assessment & Plan Note (Signed)
Currently managed with amlodipine which may be aggravating his edema given concurrent use of gabepentin  ,  Will recommend change to losartan

## 2017-09-30 ENCOUNTER — Telehealth: Payer: Self-pay | Admitting: Internal Medicine

## 2017-09-30 NOTE — Telephone Encounter (Signed)
I want to change his blood pressure medication ,  He will stop the amlodipine and take the losartan instead.  It will be better for him since he is having leg swelling .  Return after one week for BP check and BMET

## 2017-09-30 NOTE — Telephone Encounter (Signed)
-----   Message from Sandy SalaamJessica Woodward, [place] sent at 09/30/2017  1:13 PM EDT ----- I do not see this message in the chart anywhere.   Shanda BumpsJessica, CMA ----- Message ----- From: Sherlene Shamsullo, Chrishawn Boley L, MD Sent: 09/28/2017   8:15 PM To: Sandy SalaamJessica Woodward, CMA  See prior message about BP medication change.  If not found,  Let me know.  Epic nightmare

## 2017-09-30 NOTE — Telephone Encounter (Signed)
Spoke with pt and informed him that Dr. Darrick Huntsmanullo would like for him to change his blood pressure medication. todl him to stop taking the amlodipine and start taking the losartan-hctz. Pt gave a verbal understanding and stated that he would switch starting Thursday when he can get his medication picked up.

## 2017-09-30 NOTE — Telephone Encounter (Signed)
Scheduled pt for a nurse visit and lab appt. Pt is aware of appt date and time.

## 2017-10-02 ENCOUNTER — Telehealth: Payer: Self-pay

## 2017-10-02 MED ORDER — LOSARTAN POTASSIUM 50 MG PO TABS: 50.0000 mg | ORAL_TABLET | Freq: Every day | ORAL | 3 refills | Status: DC

## 2017-10-02 NOTE — Telephone Encounter (Signed)
Ok to just start the losartan instead of the amlodipine , and Do not start the hctz.  Call pharmacy and cancel the hctz . I will update chart with losartan 50 mg and send to [facility] fr confirmation

## 2017-10-02 NOTE — Telephone Encounter (Signed)
Pt came in the office yesterday to let us know that the blood pressure mediation you switched him to was still on back order but that the pharmacy could fill it as two separate pills. You gave the verbal okay to switch it yesterday. Called [facility] to let them know that it was okay to switch to two separate pills. Called the pt to let him know that the medication was going to be split into two separate pills and the pt stated that he was never told why his medication was being changed. I explained to the pt that I personally spoke with him just a few days ago and told him that Dr. Darrick Huntsmanullo wanted to stop his amlodipine and start him on losartan-hctz because of the swelling in his legs and amlodipine can cause fluid retention. Pt at that time gave a verbal understanding and stated that he would start the medication as soon as he was able to pick it up from the pharmacy. Pt is now stating that he doesn't think he needs to change his medication because he believes the leg swelling is coming from the fact that he has not been working out lately. He stated that the leg swelling seemed to have started after he stopped working out. Pt then said that he doesn't mind taking the losartan but he doesn't feel like he needs the hctz. Pt is currently still taking the amlodipine. Please advise.

## 2017-10-03 NOTE — Telephone Encounter (Signed)
Spoke with pt to let him know that Dr. Darrick Huntsmanullo is okay with him taking just the losartan 50mg  once daily and not take the hctz. Explained to the pt that he will need to stop taking the amlodipine once he starts taking the losartan. Pt is scheduled for a lab appt and a nurse visit on 10/08/2017 since medication has been changed. Pt stated that he will keep the lab appt does "not" need to keep the nurse visit. I tried to explain to the pt that the nurse visit was for us to check his blood pressure because his blood pressure medication had been changed and we need to make sure it is keeping his blood pressure normal. The pt still felt like he did not need to keep the appt and told me to cancel the nurse visit and keep the lab appt scheduled.

## 2017-10-08 ENCOUNTER — Other Ambulatory Visit: Payer: Medicare Other

## 2017-10-08 ENCOUNTER — Ambulatory Visit (INDEPENDENT_AMBULATORY_CARE_PROVIDER_SITE_OTHER): Payer: Medicare Other

## 2017-10-08 DIAGNOSIS — I10 Essential (primary) hypertension: Secondary | ICD-10-CM

## 2017-10-08 NOTE — Progress Notes (Addendum)
Patient came in to have BP checked since changing BP medicine to Cozaar 50 mg daily.  Patients Bp was 122/78.  Patient had no light headedness, arm pain, blurry vision, chest pain, and or dizziness. Patient had no questions, comments , and or concerns at this time.   I have reviewed the above information and agree with above.  Continue losartan 50 mg daily  Duncan Dulleresa Tullo, MD

## 2017-12-30 ENCOUNTER — Other Ambulatory Visit: Payer: Self-pay | Admitting: Internal Medicine

## 2017-12-30 DIAGNOSIS — E785 Hyperlipidemia, unspecified: Secondary | ICD-10-CM

## 2018-01-29 ENCOUNTER — Other Ambulatory Visit: Payer: Self-pay | Admitting: Internal Medicine

## 2018-02-06 ENCOUNTER — Ambulatory Visit (INDEPENDENT_AMBULATORY_CARE_PROVIDER_SITE_OTHER): Payer: Medicare Other | Admitting: Internal Medicine

## 2018-02-06 ENCOUNTER — Encounter: Payer: Self-pay | Admitting: Internal Medicine

## 2018-02-06 VITALS — BP 130/90 | HR 67 | Temp 97.8°F | Resp 16 | Ht 69.0 in | Wt 191.4 lb

## 2018-02-06 DIAGNOSIS — Z0000 Encounter for general adult medical examination without abnormal findings: Secondary | ICD-10-CM

## 2018-02-06 DIAGNOSIS — Z125 Encounter for screening for malignant neoplasm of prostate: Secondary | ICD-10-CM

## 2018-02-06 DIAGNOSIS — I10 Essential (primary) hypertension: Secondary | ICD-10-CM

## 2018-02-06 DIAGNOSIS — N529 Male erectile dysfunction, unspecified: Secondary | ICD-10-CM

## 2018-02-06 DIAGNOSIS — E785 Hyperlipidemia, unspecified: Secondary | ICD-10-CM | POA: Diagnosis not present

## 2018-02-06 DIAGNOSIS — R7303 Prediabetes: Secondary | ICD-10-CM | POA: Diagnosis not present

## 2018-02-06 LAB — PSA, MEDICARE: PSA: 1.37 ng/ml (ref 0.10–4.00)

## 2018-02-06 NOTE — Progress Notes (Signed)
Patient ID: George Fowler, male    DOB: Jan 01, 1966  Age: 52 y.o. MRN: [id number]  The patient is here for annual preventive  examination and management of other chronic and acute problems.   Discussed cologuard for colon CA screenig Discussed need for PSA .  Last food intake was 9:30 am  Last eye exam 5 years ago during  DOT exam.  Doesn't trust exam .  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  Then at age 29 he had surgery on eyelid by Dr.  Oren Bracket,  Told glasses were not necessary .     The risk factors are reflected in the social history.  The roster of all physicians providing medical care to patient - is listed in the Snapshot section of the chart.  Activities of daily living:  The patient is 100% independent in all ADLs: dressing, toileting, feeding as well as independent mobility  Home safety : The patient has smoke detectors in the home. They wear seatbelts.  There are no firearms at home. There is no violence in the home.   There is no risks for hepatitis, STDs or HIV. There is no   history of blood transfusion. They have no travel history to infectious disease endemic areas of the world.  The patient has seen their dentist in the last six month. They deny hearing difficulty with regard to whispered voices and some television programs.  They have deferred audiologic testing in the last year.  They do not  have excessive sun exposure. Discussed the need for sun protection: hats, long sleeves and use of sunscreen if there is significant sun exposure.   Diet: the importance of a healthy diet is discussed. They do have a healthy diet.  The benefits of regular aerobic exercise were discussed. He has resumed exercising for the past 3 weeks after a 7 month break,  at home :  bench pressing,  Stationery bike 5 /week,  25 minutes .   Depression screen: there are no signs or vegative symptoms of depression-  irritability, change in appetite, anhedonia, sadness/tearfullness.  Cognitive assessment: the patient manages all their financial and personal affairs and is actively engaged. They could relate day,date,year and events; recalled 2/3 objects at 3 minutes; performed clock-face test normally.  The following portions of the patient's history were reviewed and updated as appropriate: allergies, current medications, past family history, past medical history,  past surgical history, past social history  and problem list.  Visual acuity was not assessed per patient preference since she has regular follow up with her ophthalmologist. Hearing and body mass index were assessed and reviewed.   During the course of the visit the patient was educated and counseled about appropriate screening and preventive services including : fall prevention , diabetes screening, nutrition counseling, colorectal cancer screening, and recommended immunizations.    CC: The primary encounter diagnosis was Essential hypertension, benign. Diagnoses of Hyperlipidemia LDL goal <100, Prostate cancer screening, Prediabetes, Impotence, and Encounter for preventive health examination were also pertinent to this visit.  1) HTN:  With INCIDENTAL finding of a lacunar infarct noted on recent head CT    2) overweight: wants to lose 25 lbs. discussed eating habits  .  No junk food    3) Having episodes of loose stools brought on by eating greasy food . Not occurring daily  .  History Kimber has a past medical history  of Foot injury, Neuropathy of leg, No pertinent past medical history, Seasonal allergic rhinitis, and Umbilical hernia (11/14/2010).   He has a past surgical history that includes Foot surgery (2003-lt); Foot arthroplasty (2003); Eye surgery (2005); and Umbilical hernia repair (09/25/2011).   His family history includes Cancer in his unknown relative; Diabetes in his mother; Heart disease in his paternal grandfather and  paternal grandmother; Hypertension in his father and mother.He reports that he quit smoking about 27 years ago. He has never used smokeless tobacco. He reports that he drinks about 1.0 - 2.0 standard drinks of alcohol per week. He reports that he does not use drugs.  Outpatient Medications Prior to Visit  Medication Sig Dispense Refill  . atorvastatin (LIPITOR) 20 MG tablet TAKE 1 TABLET BY MOUTH ONCE DAILY 90 tablet 2  . Calcium Carbonate-Vitamin D (CALCIUM 600+D) 600-400 MG-UNIT per tablet Take 1 tablet by mouth daily.     Marland Kitchen gabapentin (NEURONTIN) 300 MG capsule TAKE 3 CAPSULES BY MOUTH TWICE DAILY 540 capsule 3  . losartan (COZAAR) 50 MG tablet Take 1 tablet (50 mg total) by mouth daily. 90 tablet 3  . sildenafil (REVATIO) 20 MG tablet Take 1 tablet (20 mg total) by mouth 3 (three) times daily. 10 tablet 0  . Vitamin A 16109 units TABS Take 1 tablet by mouth daily.    . vitamin B-12 (CYANOCOBALAMIN) 500 MCG tablet Take 500 mcg by mouth 2 (two) times daily.       No facility-administered medications prior to visit.     Review of Systems   Patient denies headache, fevers, malaise, unintentional weight loss, skin rash, eye pain, sinus congestion and sinus pain, sore throat, dysphagia,  hemoptysis , cough, dyspnea, wheezing, chest pain, palpitations, orthopnea, edema, abdominal pain, nausea, melena, diarrhea, constipation, flank pain, dysuria, hematuria, urinary  Frequency, nocturia, numbness, tingling, seizures,  Focal weakness, Loss of consciousness,  Tremor, insomnia, depression, anxiety, and suicidal ideation.      Objective:  BP 130/90 (BP Location: Left Arm, Patient Position: Sitting, Cuff Size: Normal)   Pulse 67   Temp 97.8 F (36.6 C) (Oral)   Resp 16   Ht 5\' 9"  (1.753 m)   Wt 191 lb 6.4 oz (86.8 kg)   SpO2 96%   BMI 28.26 kg/m   Physical Exam  General appearance: alert, cooperative and appears stated age Ears: normal TM's and external ear canals both ears Throat: lips,  mucosa, and tongue normal; teeth and gums normal Neck: no adenopathy, no carotid bruit, supple, symmetrical, trachea midline and thyroid not enlarged, symmetric, no tenderness/mass/nodules Back: symmetric, no curvature. ROM normal. No CVA tenderness. Lungs: clear to auscultation bilaterally Heart: regular rate and rhythm, S1, S2 normal, no murmur, click, rub or gallop Abdomen: soft, non-tender; bowel sounds normal; no masses,  no organomegaly Pulses: 2+ and symmetric Skin: Skin color, texture, turgor normal. No rashes or lesions Lymph nodes: Cervical, supraclavicular, and axillary nodes normal.   Assessment & Plan:   Problem List Items Addressed This Visit    Encounter for preventive health examination    Annual comprehensive preventive exam was done as well as an evaluation and management of acute and chronic conditions .  During the course of the visit the patient was educated and counseled about appropriate screening and preventive services including :  diabetes screening, lipid analysis with projected  10 year  risk for CAD , nutrition counseling, prostate and colorectal cancer screening, and recommended immunizations.  Printed recommendations for health maintenance screenings was  given.   Cologuard ordered.  PSA normal.   Lab Results  Component Value Date   PSA 1.37 02/06/2018         Essential hypertension, benign - Primary    He reports compliance with medications but does not check BPs at home   He is resistant to change. . He has been asked to  check his blood pressure several times over the next 3-4 weeks and to submit readings for evaluation.   Lab Results  Component Value Date   CREATININE 1.10 02/06/2018   Lab Results  Component Value Date   NA 140 02/06/2018   K 5.2 02/06/2018   CL 104 02/06/2018   CO2 25 02/06/2018         Relevant Orders   Comprehensive metabolic panel (Completed)   Hyperlipidemia LDL goal <100    He has cerebrovascular atherosclerosis  noted on head CT .  Statin advised,  He is tolerating a trial of lipitor.  Liver enzymes are normal.     Lab Results  Component Value Date   CHOL 131 02/06/2018   HDL 35 (L) 02/06/2018   LDLCALC 72 02/06/2018   LDLDIRECT 135.0 12/02/2016   TRIG 166 (H) 02/06/2018   CHOLHDL 3.7 02/06/2018    Lab Results  Component Value Date   ALT 24 02/06/2018   AST 27 02/06/2018   ALKPHOS 85 07/31/2017   BILITOT 0.9 02/06/2018          Relevant Orders   Lipid panel (Completed)   LDL cholesterol, direct   Prediabetes   Relevant Orders   Hemoglobin A1c (Completed)    Other Visit Diagnoses    Prostate cancer screening       Relevant Orders   PSA, Medicare (Completed)   Impotence          I am having Veverly FellsMichael D. Dayton ScrapeMurray maintain his Calcium Carbonate-Vitamin D, vitamin B-12, Vitamin A, sildenafil, losartan, atorvastatin, and gabapentin.  No orders of the defined types were placed in this encounter.   There are no discontinued medications.  Follow-up: Return in about 6 months (around 08/07/2018).   Sherlene Shamseresa L Yiannis Tulloch, MD

## 2018-02-06 NOTE — Patient Instructions (Addendum)
your blood pressure  Has been  Elevated the last 4 times you have been here.  Since it is above the currently recommended goal of 130/80 or less , preferably  120/70, so I am recommending that you start  Checking  your blood pressure at home and send me the readings in a few weeks   I will initiate the order for your colon cancer screening  Test.  It is called  Cologuard.  It will be delivered to your house, and you will send off a stool sample in the envelope it provides.     Health Maintenance, Male A healthy lifestyle and preventive care is important for your health and wellness. Ask your health care provider about what schedule of regular examinations is right for you. What should I know about weight and diet? Eat a Healthy Diet  Eat plenty of vegetables, fruits, whole grains, low-fat dairy products, and lean protein.  Do not eat a lot of foods high in solid fats, added sugars, or salt.  Maintain a Healthy Weight Regular exercise can help you achieve or maintain a healthy weight. You should:  Do at least 150 minutes of exercise each week. The exercise should increase your heart rate and make you sweat (moderate-intensity exercise).  Do strength-training exercises at least twice a week.  Watch Your Levels of Cholesterol and Blood Lipids  Have your blood tested for lipids and cholesterol every 5 years starting at 51 years of age. If you are at high risk for heart disease, you should start having your blood tested when you are 51 years old. You may need to have your cholesterol levels checked more often if: ? Your lipid or cholesterol levels are high. ? You are older than 51 years of age. ? You are at high risk for heart disease.  What should I know about cancer screening? Many types of cancers can be detected early and may often be prevented. Lung Cancer  You should be screened every year for lung cancer if: ? You are a current smoker who has smoked for at least 30 years. ? You  are a former smoker who has quit within the past 15 years.  Talk to your health care provider about your screening options, when you should start screening, and how often you should be screened.  Colorectal Cancer  Routine colorectal cancer screening usually begins at 50 years of age and should be repeated every 5-10 years until you are 51 years old. You may need to be screened more often if early forms of precancerous polyps or small growths are found. Your health care provider may recommend screening at an earlier age if you have risk factors for colon cancer.  Your health care provider may recommend using home test kits to check for hidden blood in the stool.  A small camera at the end of a tube can be used to examine your colon (sigmoidoscopy or colonoscopy). This checks for the earliest forms of colorectal cancer.  Prostate and Testicular Cancer  Depending on your age and overall health, your health care provider may do certain tests to screen for prostate and testicular cancer.  Talk to your health care provider about any symptoms or concerns you have about testicular or prostate cancer.  Skin Cancer  Check your skin from head to toe regularly.  Tell your health care provider about any new moles or changes in moles, especially if: ? There is a change in a mole's size, shape, or color. ?  You have a mole that is larger than a pencil eraser.  Always use sunscreen. Apply sunscreen liberally and repeat throughout the day.  Protect yourself by wearing long sleeves, pants, a wide-brimmed hat, and sunglasses when outside.  What should I know about heart disease, diabetes, and high blood pressure?  If you are 4018-51 years of age, have your blood pressure checked every 3-5 years. If you are 51 years of age or older, have your blood pressure checked every year. You should have your blood pressure measured twice-once when you are at a hospital or clinic, and once when you are not at a  hospital or clinic. Record the average of the two measurements. To check your blood pressure when you are not at a hospital or clinic, you can use: ? An automated blood pressure machine at a pharmacy. ? A home blood pressure monitor.  Talk to your health care provider about your target blood pressure.  If you are between 4745-51 years old, ask your health care provider if you should take aspirin to prevent heart disease.  Have regular diabetes screenings by checking your fasting blood sugar level. ? If you are at a normal weight and have a low risk for diabetes, have this test once every three years after the age of 51. ? If you are overweight and have a high risk for diabetes, consider being tested at a younger age or more often.  A one-time screening for abdominal aortic aneurysm (AAA) by ultrasound is recommended for men aged 65-75 years who are current or former smokers. What should I know about preventing infection? Hepatitis B If you have a higher risk for hepatitis B, you should be screened for this virus. Talk with your health care provider to find out if you are at risk for hepatitis B infection. Hepatitis C Blood testing is recommended for:  Everyone born from [birth year] through 1964.  Anyone with known risk factors for hepatitis C.  Sexually Transmitted Diseases (STDs)  You should be screened each year for STDs including gonorrhea and chlamydia if: ? You are sexually active and are younger than 51 years of age. ? You are older than 51 years of age and your health care provider tells you that you are at risk for this type of infection. ? Your sexual activity has changed since you were last screened and you are at an increased risk for chlamydia or gonorrhea. Ask your health care provider if you are at risk.  Talk with your health care provider about whether you are at high risk of being infected with HIV. Your health care provider may recommend a prescription medicine to help prevent  HIV infection.  What else can I do?  Schedule regular health, dental, and eye exams.  Stay current with your vaccines (immunizations).  Do not use any tobacco products, such as cigarettes, chewing tobacco, and e-cigarettes. If you need help quitting, ask your health care provider.  Limit alcohol intake to no more than 2 drinks per day. One drink equals 12 ounces of beer, 5 ounces of wine, or 1 ounces of hard liquor.  Do not use street drugs.  Do not share needles.  Ask your health care provider for help if you need support or information about quitting drugs.  Tell your health care provider if you often feel depressed.  Tell your health care provider if you have ever been abused or do not feel safe at home. This information is not intended to replace advice given  to you by your health care provider. Make sure you discuss any questions you have with your health care provider. Document Released: 08/31/2007 Document Revised: 11/01/2015 Document Reviewed: 12/06/2014 Elsevier Interactive Patient Education  [employer].

## 2018-02-08 NOTE — Assessment & Plan Note (Signed)
his  fasting glucose has never been  diagnostic of diabetes; and he has reduced his  A1c into the normal range.   I have addressed  BMI and recommended wt loss of 10% of body weight over the next 6 months using a low glycemic index diet and regular exercise a minimum of 5 days per week.   Lab Results  Component Value Date   HGBA1C 5.5 02/06/2018

## 2018-02-08 NOTE — Assessment & Plan Note (Signed)
Annual comprehensive preventive exam was done as well as an evaluation and management of acute and chronic conditions .  During the course of the visit the patient was educated and counseled about appropriate screening and preventive services including :  diabetes screening, lipid analysis with projected  10 year  risk for CAD , nutrition counseling, prostate and colorectal cancer screening, and recommended immunizations.  Printed recommendations for health maintenance screenings was given.   Cologuard ordered.  PSA normal.   Lab Results  Component Value Date   PSA 1.37 02/06/2018

## 2018-02-08 NOTE — Assessment & Plan Note (Signed)
He reports compliance with medications but does not check BPs at home   He is resistant to change. . He has been asked to  check his blood pressure several times over the next 3-4 weeks and to submit readings for evaluation.   Lab Results  Component Value Date   CREATININE 1.10 02/06/2018   Lab Results  Component Value Date   NA 140 02/06/2018   K 5.2 02/06/2018   CL 104 02/06/2018   CO2 25 02/06/2018

## 2018-02-08 NOTE — Assessment & Plan Note (Signed)
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  Statin advised,  He is tolerating a trial of lipitor.  Liver enzymes are normal.     Lab Results  Component Value Date   CHOL 131 02/06/2018   HDL 35 (L) 02/06/2018   LDLCALC 72 02/06/2018   LDLDIRECT 135.0 12/02/2016   TRIG 166 (H) 02/06/2018   CHOLHDL 3.7 02/06/2018    Lab Results  Component Value Date   ALT 24 02/06/2018   AST 27 02/06/2018   ALKPHOS 85 07/31/2017   BILITOT 0.9 02/06/2018

## 2018-02-10 ENCOUNTER — Other Ambulatory Visit: Payer: Self-pay

## 2018-02-10 MED ORDER — SILDENAFIL CITRATE 20 MG PO TABS: 20.0000 mg | ORAL_TABLET | Freq: Three times a day (TID) | ORAL | 0 refills | Status: DC

## 2018-02-14 LAB — COMPREHENSIVE METABOLIC PANEL
AG RATIO: 2.1 (calc) (ref 1.0–2.5)
ALBUMIN MSPROF: 4.2 g/dL (ref 3.6–5.1)
ALT: 24 U/L (ref 9–46)
AST: 27 U/L (ref 10–35)
Alkaline phosphatase (APISO): 88 U/L (ref 40–115)
BUN: 13 mg/dL (ref 7–25)
CHLORIDE: 104 mmol/L (ref 98–110)
CO2: 25 mmol/L (ref 20–32)
Calcium: 8.9 mg/dL (ref 8.6–10.3)
Creat: 1.1 mg/dL (ref 0.70–1.33)
GLOBULIN: 2 g/dL (ref 1.9–3.7)
Glucose, Bld: 63 mg/dL — ABNORMAL LOW (ref 65–99)
POTASSIUM: 5.2 mmol/L (ref 3.5–5.3)
SODIUM: 140 mmol/L (ref 135–146)
TOTAL PROTEIN: 6.2 g/dL (ref 6.1–8.1)
Total Bilirubin: 0.9 mg/dL (ref 0.2–1.2)

## 2018-02-14 LAB — LIPID PANEL
CHOL/HDL RATIO: 3.7 (calc) (ref <5.0)
Cholesterol: 131 mg/dL (ref <200)
HDL: 35 mg/dL — AB (ref >40)
LDL CHOLESTEROL (CALC): 72 mg/dL
NON-HDL CHOLESTEROL (CALC): 96 mg/dL (ref <130)
TRIGLYCERIDES: 166 mg/dL — AB (ref <150)

## 2018-02-14 LAB — TESTOS,TOTAL,FREE AND SHBG (FEMALE)
Free Testosterone: 48.4 pg/mL (ref 35.0–155.0)
Sex Hormone Binding: 28 nmol/L (ref 10–50)
Testosterone, Total, LC-MS-MS: 314 ng/dL (ref 250–1100)

## 2018-02-14 LAB — HEMOGLOBIN A1C
Hgb A1c MFr Bld: 5.5 % of total Hgb (ref <5.7)
Mean Plasma Glucose: 111 (calc)
eAG (mmol/L): 6.2 (calc)

## 2018-02-14 LAB — LDL CHOLESTEROL, DIRECT: Direct LDL: 82 mg/dL (ref <100)

## 2018-03-13 ENCOUNTER — Telehealth: Payer: Self-pay

## 2018-03-13 MED ORDER — LOSARTAN POTASSIUM 50 MG PO TABS: 100.0000 mg | ORAL_TABLET | Freq: Every day | ORAL | 1 refills | Status: DC

## 2018-03-13 NOTE — Telephone Encounter (Signed)
Pt stated that his blood pressure has been running high. He stated that it was 184/105 over the holiday, so he started taking an extra losartan 50mg  in the evening. Pt stated that with taking the losartan 50mg  in the morning and one in the evening his blood pressure has come done to 154/88. Pt also stated that when his blood pressure gets elevated he has buzzing in his ears.

## 2018-03-13 NOTE — Telephone Encounter (Signed)
He should increase his dose of losartan to 100 mg daily .  He can take both 50 mg tablets at the same time,  Once daily .  He needs an RN visit next week to check BP bc he may need additional medications if he is not at goal of 130/80   [facility]  . Make sure he reads the label,  because there is a shortage on losartan and they  may only have 50 mg tablets available .  if so,  He'll have to continue taking 2 daily

## 2018-03-13 NOTE — Telephone Encounter (Signed)
I spoke with patient & notified him on new dosing of Losartan. He has BP check scheduled for Tuesday.

## 2018-03-17 ENCOUNTER — Telehealth: Payer: Self-pay

## 2018-03-17 ENCOUNTER — Ambulatory Visit: Payer: Medicare Other

## 2018-03-17 DIAGNOSIS — Z0130 Encounter for examination of blood pressure without abnormal findings: Secondary | ICD-10-CM

## 2018-03-17 DIAGNOSIS — I10 Essential (primary) hypertension: Secondary | ICD-10-CM

## 2018-03-17 MED ORDER — HYDROCHLOROTHIAZIDE 12.5 MG PO TABS: 12.5000 mg | ORAL_TABLET | Freq: Every day | ORAL | 1 refills | Status: DC

## 2018-03-17 NOTE — Assessment & Plan Note (Signed)
Continue losartan 50 mg daily  Adding hctz 12.5 mg daily  rtc one week for rn visit to recheck bp and bring home meter

## 2018-03-17 NOTE — Telephone Encounter (Signed)
Pt is taking losartan for BP. Pt denied having any issues while taking losartan.    Per patient his BP seem to go up in the afternoon and he will hear a buzz like nose in his ear.   Pt stated that his BP's at home have been around 130's/80's.   RA: O2:99, P:62, BP: 142/100   LA: O2:96, P:54, BP: 138/98  The following are BP checks at our office with BP monitor. LA BP on BP monitor: 131/84  RA BP on BP monitor 131/81  Per Dr. Darrick Huntsmanullo who reviewed pt's reading's she recommenced patient to take HCTZ 12.5 MG in the AM along with the lisinopril and come back in 1 week for BP check with his BP monitor.   Pt declined to start the HCTZ at this time will follow up in 1 week with his BP monitor for another NV for BP check. Pt stated that he is happy with his BP being in the 130's/80's.    Pt stated that at his next OV if Dr. Darrick Huntsmanullo still feels that the HCTZ is needed he will consistor it.

## 2018-03-17 NOTE — Telephone Encounter (Signed)
  I have reviewed the above information and agree with above.   Nyara Capell, MD 

## 2018-03-17 NOTE — Progress Notes (Addendum)
Pt is taking losartan for BP. Pt denied having any issues while taking losartan.    Per patient his BP seem to go up in the afternoon and he will hear a buzz like nose in his ear.   Pt stated that his BP's at home have been around 130's/80's.   RA: O2:99, P:62, BP: 142/100   LA: O2:96, P:54, BP: 138/98  The following are BP checks at our office with BP monitor. LA BP on BP monitor: 131/84  RA BP on BP monitor 131/81  Per Dr. Tullo who reviewed pt's reading's she recommenced patient to take HCTZ 12.5 MG in the AM along with the lisinopril and come back in 1 week for BP check with his BP monitor.   Pt declined to start the HCTZ at this time will follow up in 1 week with his BP monitor for another NV for BP check. Pt stated that he is happy with his BP being in the 130's/80's.    Pt stated that at his next OV if Dr. Tullo still feels that the HCTZ is needed he will consistor it.  

## 2018-03-25 ENCOUNTER — Ambulatory Visit: Payer: Medicare Other

## 2018-09-21 ENCOUNTER — Other Ambulatory Visit: Payer: Self-pay | Admitting: Internal Medicine

## 2018-11-10 ENCOUNTER — Other Ambulatory Visit: Payer: Self-pay

## 2018-11-10 NOTE — Patient Outreach (Signed)
[facility]) Care Management  11/10/2018  George Fowler 05-06-1965 [id number]   Medication Adherence call to Mr. [name] Hippa Identifiers Verify spoke with patient he is past due on Atorvastatin 20 mg patient explain he take 1 tablet daily patient does not have any left he ask if we can call [facility] an order this medication pharmacy will have it ready for patient he will pick up today.Mr Dirr is showing past due under [place].   [place] Management Direct Dial [phone]  Fax [phone] [email or web address]

## 2018-11-12 ENCOUNTER — Other Ambulatory Visit: Payer: Self-pay | Admitting: Internal Medicine

## 2018-11-12 DIAGNOSIS — E785 Hyperlipidemia, unspecified: Secondary | ICD-10-CM

## 2019-02-23 ENCOUNTER — Other Ambulatory Visit: Payer: Self-pay

## 2019-02-23 NOTE — Patient Outreach (Signed)
[facility]) Care Management  02/23/2019  George Fowler October 15, 1965 [id number]   Medication Adherence call to George Fowler HIPPA Compliant Voice message left with a call back number. George Fowler is showing past due on Atorvastatin 20 mg under [place].   [place] Management Direct Dial [phone]  Fax [phone] [name]

## 2019-03-02 ENCOUNTER — Telehealth: Payer: Self-pay | Admitting: Internal Medicine

## 2019-03-02 ENCOUNTER — Other Ambulatory Visit: Payer: Self-pay

## 2019-03-02 DIAGNOSIS — E785 Hyperlipidemia, unspecified: Secondary | ICD-10-CM

## 2019-03-02 MED ORDER — ATORVASTATIN CALCIUM 20 MG PO TABS: 20.0000 mg | ORAL_TABLET | Freq: Every day | ORAL | 0 refills | Status: DC

## 2019-03-02 NOTE — Patient Outreach (Signed)
[facility]) Care Management  03/02/2019  MAK BONNY 27-Aug-1965 [id number]   Medication Adherence call to Mr.  Cleophas Yoak Hippa Identifiers Verify spoke with patient he is past due on Atorvastatin 20 mg,patient explain he takes 1 tablet daily and needs a refill,patient ask to call doctors office,left a message at doctors office for them to call in a refill to [facility].Pharmacy. Mr. Woolbright is showing past due under [place].   [place] Management Direct Dial [phone]  Fax [phone] [email or web address]

## 2019-03-02 NOTE — Telephone Encounter (Signed)
Medication has been refilled for 30 days only due to pt being over due for an appt.

## 2019-03-02 NOTE — Telephone Encounter (Signed)
George Fowler called from [facility] regarding needing a refill on atorvastatin (LIPITOR) 20 MG tablet.  Pharmacy is [facility] [address] [place]

## 2019-03-17 ENCOUNTER — Telehealth: Payer: Self-pay | Admitting: Internal Medicine

## 2019-03-17 ENCOUNTER — Other Ambulatory Visit: Payer: Self-pay

## 2019-03-17 MED ORDER — HYDROCHLOROTHIAZIDE 12.5 MG PO TABS: 12.5000 mg | ORAL_TABLET | Freq: Every day | ORAL | 0 refills | Status: DC

## 2019-03-17 MED ORDER — LOSARTAN POTASSIUM 50 MG PO TABS: 100.0000 mg | ORAL_TABLET | Freq: Every day | ORAL | 0 refills | Status: DC

## 2019-03-17 NOTE — Telephone Encounter (Signed)
Medication has been refilled.

## 2019-03-17 NOTE — Telephone Encounter (Signed)
Pt is requesting a refill on his bp medication, pt could not give me the name.

## 2019-03-24 IMAGING — CT CT HEAD W/O CM
3 series · 15 of 45 positions shown, 18 images · non-contrast
Comparison: None.

EXAM:
CT HEAD WITHOUT CONTRAST
TECHNIQUE: Contiguous axial images were obtained from the base of the skull
through the vertex without intravenous contrast.

[Series 2: head wo · axial · 0.41mm/px · z∈[-52,+63]mm · 9 of 28 slices shown, 12 images]
[im 3/28  brain]
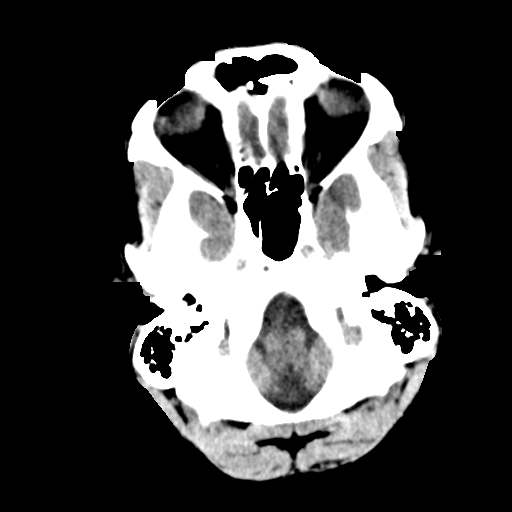
[im 3/28  bone]
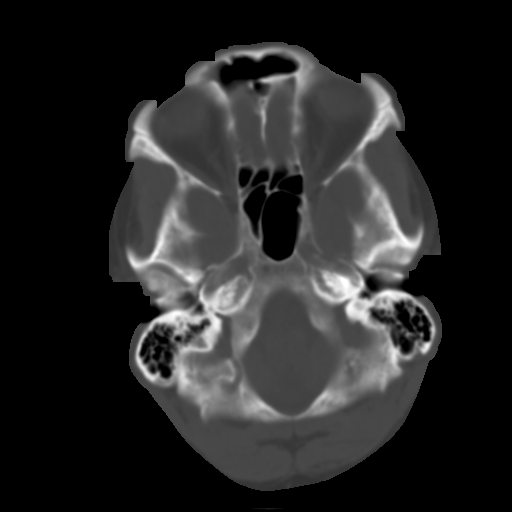
[im 6/28  brain]
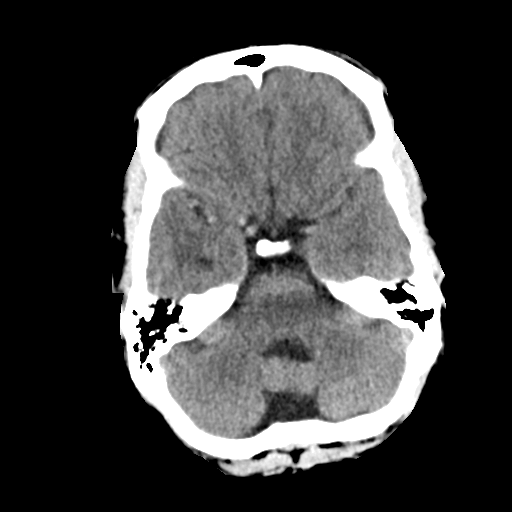
[im 9/28  brain]
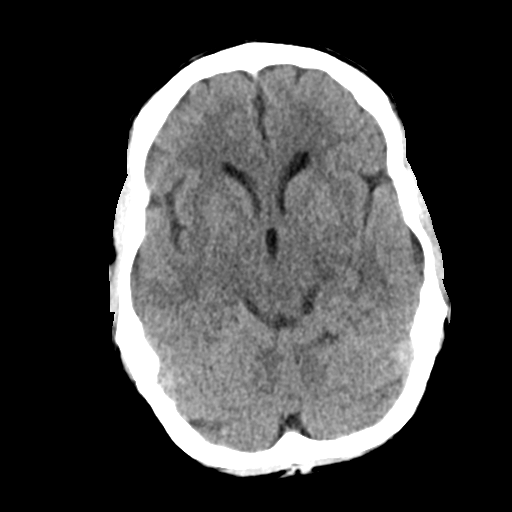
[im 12/28  brain]
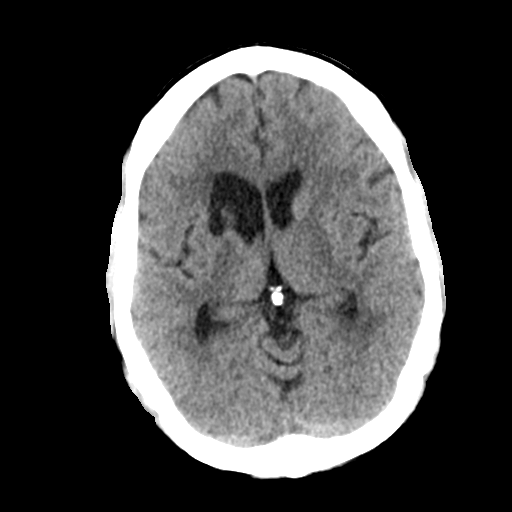
[im 15/28  brain]
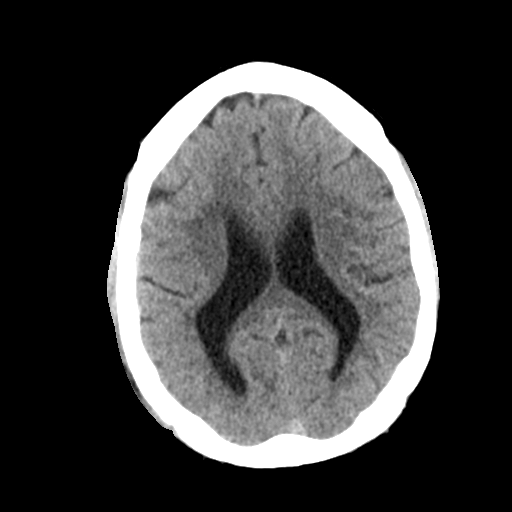
[im 15/28  bone]
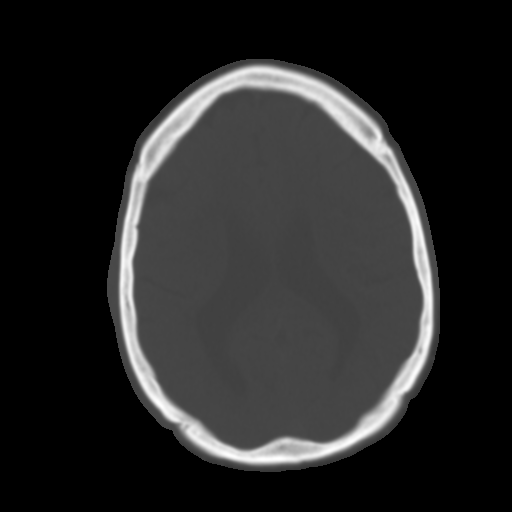
[im 17/28  brain]
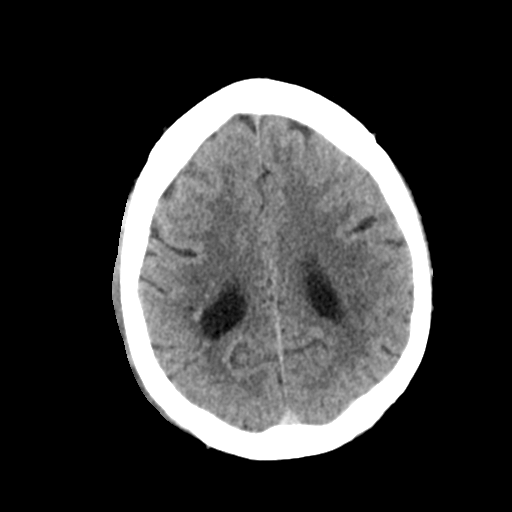
[im 20/28  brain]
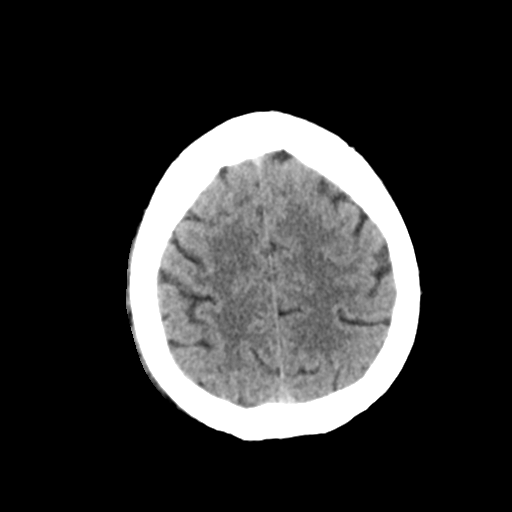
[im 23/28  brain]
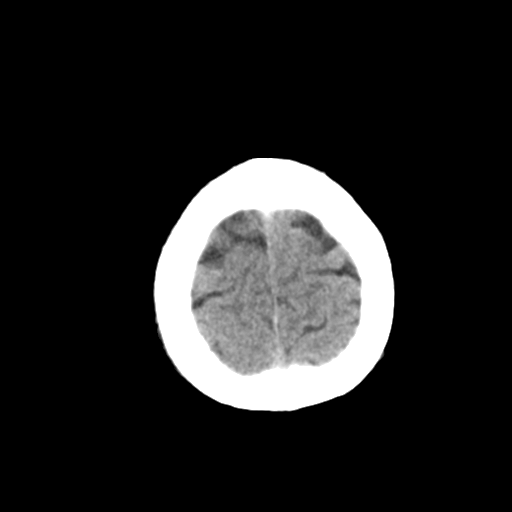
[im 26/28  brain]
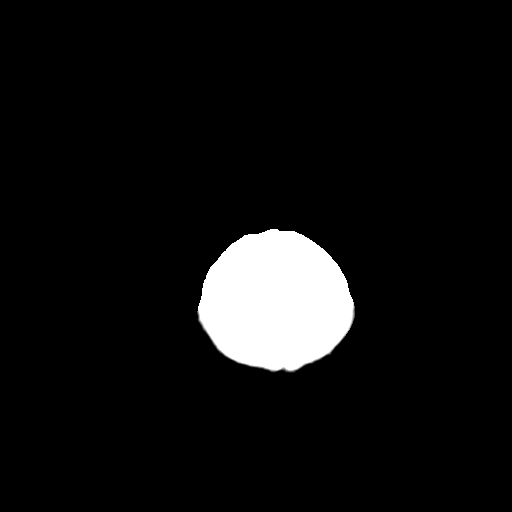
[im 26/28  bone]
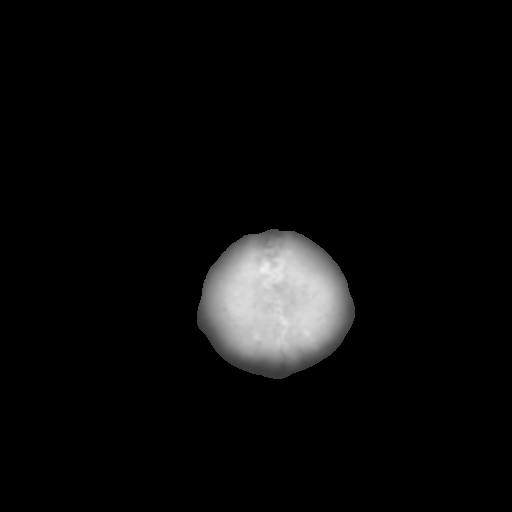

[Series 4: coronal soft tissue · coronal · 0.29mm/px · 3 of 67 slices shown]
[im 23/67  brain]
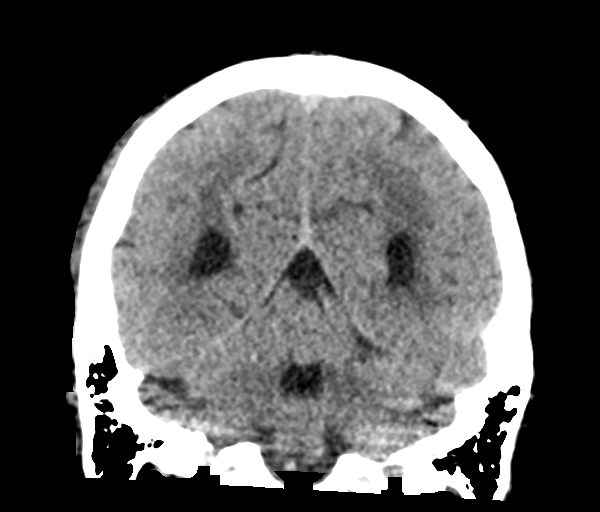
[im 30/67  brain]
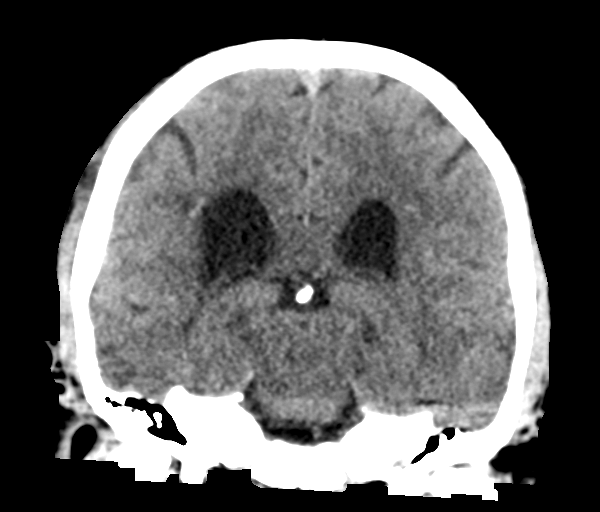
[im 37/67  brain]
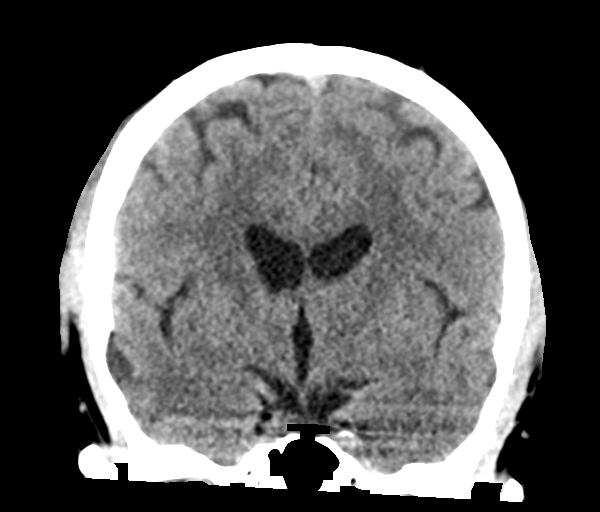

[Series 5: sagittal soft tissue · sagittal · 0.29mm/px · 3 of 60 slices shown]
[im 20/60  brain]
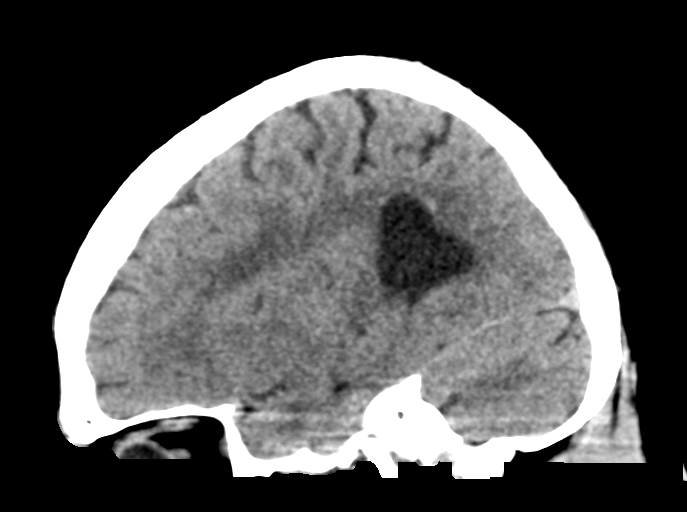
[im 30/60  brain]
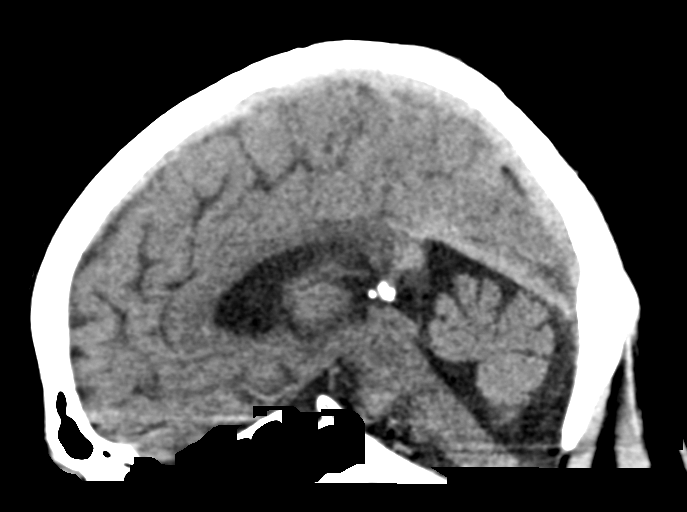
[im 40/60  brain]
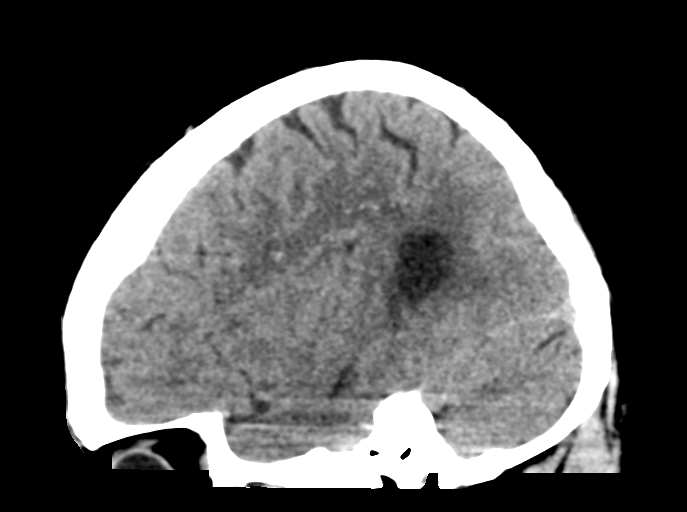

[15 of 45 positions shown; findings below may reference images not displayed]

FINDINGS: Brain: No evidence of acute infarction, hemorrhage, extra-axial
collection, ventriculomegaly, or mass effect. Old right basal
ganglia lacunar infarct. Generalized cerebral atrophy.
Periventricular white matter low attenuation likely secondary to
microangiopathy.

Vascular: Cerebrovascular atherosclerotic calcifications are noted.

Skull: Negative for fracture or focal lesion.

Sinuses/Orbits: Visualized portions of the orbits are unremarkable.
Mastoid sinuses are clear. Mild mucosal thickening of bilateral
ethmoid sinuses.

Other: None.
IMPRESSION: No acute intracranial pathology.

## 2019-04-12 ENCOUNTER — Other Ambulatory Visit: Payer: Self-pay

## 2019-04-12 MED ORDER — LOSARTAN POTASSIUM 50 MG PO TABS: 100.0000 mg | ORAL_TABLET | Freq: Every day | ORAL | 1 refills | Status: DC

## 2019-04-12 MED ORDER — HYDROCHLOROTHIAZIDE 12.5 MG PO TABS: 12.5000 mg | ORAL_TABLET | Freq: Every day | ORAL | 0 refills | Status: DC

## 2019-04-14 ENCOUNTER — Ambulatory Visit: Payer: Medicare Other | Admitting: Internal Medicine

## 2019-04-21 ENCOUNTER — Other Ambulatory Visit: Payer: Self-pay

## 2019-04-21 ENCOUNTER — Encounter: Payer: Self-pay | Admitting: Internal Medicine

## 2019-04-21 ENCOUNTER — Ambulatory Visit (INDEPENDENT_AMBULATORY_CARE_PROVIDER_SITE_OTHER): Payer: Medicare Other | Admitting: Internal Medicine

## 2019-04-21 VITALS — Ht 69.0 in | Wt 191.0 lb

## 2019-04-21 DIAGNOSIS — Z560 Unemployment, unspecified: Secondary | ICD-10-CM | POA: Diagnosis not present

## 2019-04-21 DIAGNOSIS — Z0000 Encounter for general adult medical examination without abnormal findings: Secondary | ICD-10-CM

## 2019-04-21 DIAGNOSIS — E663 Overweight: Secondary | ICD-10-CM

## 2019-04-21 DIAGNOSIS — Z125 Encounter for screening for malignant neoplasm of prostate: Secondary | ICD-10-CM | POA: Diagnosis not present

## 2019-04-21 DIAGNOSIS — I10 Essential (primary) hypertension: Secondary | ICD-10-CM | POA: Diagnosis not present

## 2019-04-21 DIAGNOSIS — R7303 Prediabetes: Secondary | ICD-10-CM | POA: Diagnosis not present

## 2019-04-21 NOTE — Assessment & Plan Note (Signed)
Taking losartan and hct  Once daily in am.  Has not had BP check in over a year.  RN visit needed

## 2019-04-21 NOTE — Assessment & Plan Note (Signed)
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

## 2019-04-21 NOTE — Assessment & Plan Note (Signed)
I have addressed  BMI and recommended wt loss of 10% of body weigh over the next 6 months using a low glycemic index diet and regular exercise a minimum of 5 days per week.   

## 2019-04-21 NOTE — Assessment & Plan Note (Signed)
age appropriate education and counseling updated, referrals for preventative services and immunizations addressed, dietary and smoking counseling addressed, most recent labs reviewed and overdue labs ordered.  HE DECLINES COLON CA SCREENING.   I have personally reviewed and have noted:  1) the patient's medical and social history 2) The pt's use of alcohol, tobacco, and illicit drugs 3) The patient's current medications and supplements 4) Functional ability including ADL's, fall risk, home safety risk, hearing and visual impairment 5) Diet and physical activities 6) Evidence for depression or mood disorder 7) The patient's height, weight, and BMI have been recorded in the chart  I have made referrals, and provided counseling and education based on review of the above

## 2019-04-21 NOTE — Progress Notes (Signed)
Virtual Visit via [email or web address]  This visit type was conducted due to national recommendations for restrictions regarding the COVID-19 pandemic (e.g. social distancing).  This format is felt to be most appropriate for this patient at this time.  All issues noted in this document were discussed and addressed.  No physical exam was performed (except for noted visual exam findings with Video Visits).   I connected with@ on 04/21/19 at  3:00 PM EST by a video enabled telemedicine application  and verified that I am speaking with the correct person using two identifiers. Location patient: home Location provider: work or home office Persons participating in the virtual visit: patient, provider  I discussed the limitations, risks, security and privacy concerns of performing an evaluation and management service by telephone and the availability of in person appointments. I also discussed with the patient that there may be a patient responsible charge related to this service. The patient expressed understanding and agreed to proceed.   Reason for visit: annual CPE  HPI:  Patient ID: George Fowler, male    DOB: 07-21-1965  Age: 54 y.o. MRN: [id number]  The patient is here for annual preventive examination and management of other chronic and acute problems.   The risk factors are reflected in the social history.  The roster of all physicians providing medical care to patient - is listed in the Snapshot section of the chart.  Activities of daily living:  The patient is 100% independent in all ADLs: dressing, toileting, feeding as well as independent mobility  Home safety : The patient has smoke detectors in the home. They wear seatbelts.  There are no firearms at home. There is no violence in the home.   There is no risks for hepatitis, STDs or HIV. There is no   history of blood transfusion. They have no travel history to infectious disease endemic areas of the world.  The patient has seen their  dentist in the last six month. They have seen their eye doctor in the last year. They admit to slight hearing difficulty with regard to whispered voices and some television programs.  They have deferred audiologic testing in the last year.  They do not  have excessive sun exposure. Discussed the need for sun protection: hats, long sleeves and use of sunscreen if there is significant sun exposure.   Diet: the importance of a healthy diet is discussed. They do have a healthy diet.  The benefits of regular aerobic exercise were discussed. He exercises 4 times per week ,  20 minutes.   Depression screen: there are no signs or vegative symptoms of depression- irritability, change in appetite, anhedonia, sadness/tearfullness.  Cognitive assessment: the patient manages all their financial and personal affairs and is actively engaged. They could relate day,date,year and events; recalled 2/3 objects at 3 minutes; performed clock-face test normally.  The following portions of the patient's history were reviewed and updated as appropriate: allergies, current medications, past family history, past medical history,  past surgical history, past social history  and problem list.  Visual acuity was not assessed per patient preference since she has regular follow up with her ophthalmologist. Hearing and body mass index were assessed and reviewed.   During the course of the visit the patient was educated and counseled about appropriate screening and preventive services including : fall prevention , diabetes screening, nutrition counseling, colorectal cancer screening, and recommended immunizations.    CC: The primary encounter diagnosis was Essential hypertension, benign. Diagnoses of Prostate cancer  screening, Encounter for preventive health examination, Not currently working due to disabled status, Prediabetes, and Overweight (BMI 25.0-29.9) were also pertinent to this visit.  Patient is taking his medications as  prescribed and notes no adverse effects.  Home BP readings have NOT BEEN DONE .    She is avoiding added salt in her diet and walking regularly about 4 times per week for exercise  .  ROS:   Patient denies headache, fevers, malaise, unintentional weight loss, skin rash, eye pain, sinus congestion and sinus pain, sore throat, dysphagia,  hemoptysis , cough, dyspnea, wheezing, chest pain, palpitations, orthopnea, edema, abdominal pain, nausea, melena, diarrhea, constipation, flank pain, dysuria, hematuria, urinary  Frequency, nocturia, numbness, tingling, seizures,  Focal weakness, Loss of consciousness,  Tremor, insomnia, depression, anxiety, and suicidal ideation.      Past Medical History:  Diagnosis Date  . Foot injury    partially disabled from  . Neuropathy of leg    Chronic; bilateral--thinks it was from working on hard floors and favoring right foot  . No pertinent past medical history   . Seasonal allergic rhinitis    with cough  . Umbilical hernia 7/62/8315   Repaired 09/25/11     Past Surgical History:  Procedure Laterality Date  . EYE SURGERY  2005   rt   . FOOT ARTHROPLASTY  2003   lt foot x3  . FOOT SURGERY  2003-lt   plate in big toe; pin in 2nd toe due to 2 ton beam falling on foot (2 surgeries on foot)  . UMBILICAL HERNIA REPAIR  09/25/2011   Procedure: HERNIA REPAIR UMBILICAL ADULT;  Surgeon: Haywood Lasso, MD;  Location: [facility];  Service: General;  Laterality: N/A;  umbilical hernia repair    Family History  Problem Relation Age of Onset  . Hypertension Father   . Hypertension Mother   . Diabetes Mother   . Heart disease Paternal Grandmother   . Heart disease Paternal Grandfather   . Cancer Other        uncle---unsure of what kind    SOCIAL HX:  reports that he quit smoking about 29 years ago. He has never used smokeless tobacco. He reports current alcohol use of about 1.0 - 2.0 standard drinks of alcohol per week. He reports that he  does not use drugs.  Current Outpatient Medications:  .  atorvastatin (LIPITOR) 20 MG tablet, Take 1 tablet (20 mg total) by mouth daily., Disp: 90 tablet, Rfl: 0 .  Calcium Carbonate-Vitamin D (CALCIUM 600+D) 600-400 MG-UNIT per tablet, Take 1 tablet by mouth daily. , Disp: , Rfl:  .  gabapentin (NEURONTIN) 300 MG capsule, TAKE 3 CAPSULES BY MOUTH TWICE DAILY, Disp: 540 capsule, Rfl: 3 .  hydrochlorothiazide (HYDRODIURIL) 12.5 MG tablet, Take 1 tablet (12.5 mg total) by mouth daily., Disp: 90 tablet, Rfl: 0 .  losartan (COZAAR) 50 MG tablet, Take 2 tablets (100 mg total) by mouth daily., Disp: 180 tablet, Rfl: 1 .  sildenafil (REVATIO) 20 MG tablet, Take 1 tablet (20 mg total) by mouth 3 (three) times daily., Disp: 10 tablet, Rfl: 0 .  Vitamin A 10000 units TABS, Take 1 tablet by mouth daily., Disp: , Rfl:  .  vitamin B-12 (CYANOCOBALAMIN) 500 MCG tablet, Take 500 mcg by mouth 2 (two) times daily.  , Disp: , Rfl:   EXAM:  VITALS per patient if applicable:  GENERAL: alert, oriented, appears well and in no acute distress  HEENT: atraumatic, conjunttiva clear, no  obvious abnormalities on inspection of external nose and ears  NECK: normal movements of the head and neck  LUNGS: on inspection no signs of respiratory distress, breathing rate appears normal, no obvious gross SOB, gasping or wheezing  CV: no obvious cyanosis  MS: moves all visible extremities without noticeable abnormality  PSYCH/NEURO: pleasant and cooperative, no obvious depression or anxiety, speech and thought processing grossly intact  ASSESSMENT AND PLAN:  Discussed the following assessment and plan:  Essential hypertension, benign - Plan: Lipid panel, Comprehensive metabolic panel  Prostate cancer screening - Plan: PSA, Medicare  Encounter for preventive health examination  Not currently working due to disabled status  Prediabetes - Plan: Hemoglobin A1c  Overweight (BMI 25.0-29.9)  Encounter for  preventive health examination age appropriate education and counseling updated, referrals for preventative services and immunizations addressed, dietary and smoking counseling addressed, most recent labs reviewed and overdue labs ordered.  HE DECLINES COLON CA SCREENING.   I have personally reviewed and have noted:  1) the patient's medical and social history 2) The pt's use of alcohol, tobacco, and illicit drugs 3) The patient's current medications and supplements 4) Functional ability including ADL's, fall risk, home safety risk, hearing and visual impairment 5) Diet and physical activities 6) Evidence for depression or mood disorder 7) The patient's height, weight, and BMI have been recorded in the chart  I have made referrals, and provided counseling and education based on review of the above  Essential hypertension, benign Taking losartan and hct  Once daily in am.  Has not had BP check in over a year.  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      I discussed the assessment and treatment plan with the patient. The patient was provided an opportunity to ask questions and all were answered. The patient agreed with the plan and demonstrated an understanding of the instructions.   The patient was advised to call back or seek an in-person evaluation if the symptoms worsen or if the condition fails to improve as anticipated.  Sherlene Shams, MD

## 2019-05-05 ENCOUNTER — Other Ambulatory Visit: Payer: Self-pay

## 2019-05-05 ENCOUNTER — Other Ambulatory Visit (INDEPENDENT_AMBULATORY_CARE_PROVIDER_SITE_OTHER): Payer: Medicare Other

## 2019-05-05 DIAGNOSIS — I10 Essential (primary) hypertension: Secondary | ICD-10-CM

## 2019-05-05 DIAGNOSIS — R7303 Prediabetes: Secondary | ICD-10-CM

## 2019-05-05 DIAGNOSIS — R944 Abnormal results of kidney function studies: Secondary | ICD-10-CM

## 2019-05-05 DIAGNOSIS — Z125 Encounter for screening for malignant neoplasm of prostate: Secondary | ICD-10-CM | POA: Diagnosis not present

## 2019-05-05 LAB — COMPREHENSIVE METABOLIC PANEL
ALT: 27 U/L (ref 0–53)
AST: 20 U/L (ref 0–37)
Albumin: 4.2 g/dL (ref 3.5–5.2)
Alkaline Phosphatase: 81 U/L (ref 39–117)
BUN: 18 mg/dL (ref 6–23)
CO2: 33 mEq/L — ABNORMAL HIGH (ref 19–32)
Calcium: 9.3 mg/dL (ref 8.4–10.5)
Chloride: 102 mEq/L (ref 96–112)
Creatinine, Ser: 1.79 mg/dL — ABNORMAL HIGH (ref 0.40–1.50)
GFR: 40.05 mL/min — ABNORMAL LOW (ref >60.00)
Glucose, Bld: 98 mg/dL (ref 70–99)
Potassium: 4 mEq/L (ref 3.5–5.1)
Sodium: 140 mEq/L (ref 135–145)
Total Bilirubin: 0.6 mg/dL (ref 0.2–1.2)
Total Protein: 6.4 g/dL (ref 6.0–8.3)

## 2019-05-05 LAB — PSA, MEDICARE: PSA: 1.6 ng/ml (ref 0.10–4.00)

## 2019-05-05 LAB — LIPID PANEL
Cholesterol: 156 mg/dL (ref 0–200)
HDL: 33 mg/dL — ABNORMAL LOW (ref >39.00)
NonHDL: 123.32
Total CHOL/HDL Ratio: 5
Triglycerides: 305 mg/dL — ABNORMAL HIGH (ref 0.0–149.0)
VLDL: 61 mg/dL — ABNORMAL HIGH (ref 0.0–40.0)

## 2019-05-05 LAB — LDL CHOLESTEROL, DIRECT: Direct LDL: 63 mg/dL

## 2019-05-05 LAB — HEMOGLOBIN A1C: Hgb A1c MFr Bld: 6.5 % (ref 4.6–6.5)

## 2019-05-07 NOTE — Addendum Note (Signed)
Addended by: Sherlene Shams on: 05/07/2019 06:08 PM   Modules accepted: Orders

## 2019-05-18 ENCOUNTER — Other Ambulatory Visit (INDEPENDENT_AMBULATORY_CARE_PROVIDER_SITE_OTHER): Payer: Medicare Other

## 2019-05-18 ENCOUNTER — Other Ambulatory Visit: Payer: Self-pay

## 2019-05-18 DIAGNOSIS — I10 Essential (primary) hypertension: Secondary | ICD-10-CM

## 2019-05-18 DIAGNOSIS — R944 Abnormal results of kidney function studies: Secondary | ICD-10-CM

## 2019-05-18 LAB — MICROALBUMIN / CREATININE URINE RATIO
Creatinine,U: 67.8 mg/dL
Microalb Creat Ratio: 1 mg/g (ref 0.0–30.0)
Microalb, Ur: <0.7 mg/dL (ref 0.0–1.9)

## 2019-05-18 LAB — BASIC METABOLIC PANEL
BUN: 18 mg/dL (ref 6–23)
CO2: 29 mEq/L (ref 19–32)
Calcium: 9.1 mg/dL (ref 8.4–10.5)
Chloride: 107 mEq/L (ref 96–112)
Creatinine, Ser: 1.47 mg/dL (ref 0.40–1.50)
GFR: 50.27 mL/min — ABNORMAL LOW (ref >60.00)
Glucose, Bld: 99 mg/dL (ref 70–99)
Potassium: 4.5 mEq/L (ref 3.5–5.1)
Sodium: 139 mEq/L (ref 135–145)

## 2019-05-27 DIAGNOSIS — L570 Actinic keratosis: Secondary | ICD-10-CM | POA: Diagnosis not present

## 2019-06-13 ENCOUNTER — Other Ambulatory Visit: Payer: Self-pay | Admitting: Internal Medicine

## 2019-06-24 ENCOUNTER — Other Ambulatory Visit: Payer: Self-pay

## 2019-06-24 MED ORDER — GABAPENTIN 300 MG PO CAPS: 900.0000 mg | ORAL_CAPSULE | Freq: Two times a day (BID) | ORAL | 0 refills | Status: DC

## 2019-06-24 MED ORDER — GABAPENTIN 300 MG PO CAPS: 900.0000 mg | ORAL_CAPSULE | Freq: Two times a day (BID) | ORAL | 2 refills | Status: DC

## 2019-07-16 ENCOUNTER — Telehealth: Payer: Self-pay | Admitting: Internal Medicine

## 2019-07-16 DIAGNOSIS — J301 Allergic rhinitis due to pollen: Secondary | ICD-10-CM

## 2019-07-16 MED ORDER — FLUTICASONE PROPIONATE 50 MCG/ACT NA SUSP: 2.0000 | Freq: Every day | NASAL | 6 refills | Status: AC

## 2019-07-16 NOTE — Telephone Encounter (Signed)
Not in current medication list. Flonase was last filled in 2018.

## 2019-07-16 NOTE — Telephone Encounter (Signed)
Pt would like a refill on rx nasal spray. I did not see on med list.

## 2019-07-16 NOTE — Telephone Encounter (Signed)
flonase authorized and sent

## 2019-08-05 ENCOUNTER — Other Ambulatory Visit: Payer: Self-pay

## 2019-08-05 MED ORDER — LOSARTAN POTASSIUM 50 MG PO TABS: 100.0000 mg | ORAL_TABLET | Freq: Every day | ORAL | 1 refills | Status: DC

## 2019-08-23 ENCOUNTER — Telehealth: Payer: Self-pay | Admitting: Internal Medicine

## 2019-08-23 DIAGNOSIS — E200 Idiopathic hypoparathyroidism: Secondary | ICD-10-CM

## 2019-08-23 DIAGNOSIS — G609 Hereditary and idiopathic neuropathy, unspecified: Secondary | ICD-10-CM

## 2019-08-23 NOTE — Telephone Encounter (Signed)
Pt needs a prescription for shoe inserts. His appt for inserts is next month but the need it before then. He said it is [facility] he is using for inserts.   Pt also needs sildenafil prescription. He said his insurance will pay for 30 day supply. He needs it sent to [facility].   He would like a call back also.

## 2019-08-25 MED ORDER — SILDENAFIL CITRATE 20 MG PO TABS: 20.0000 mg | ORAL_TABLET | Freq: Three times a day (TID) | ORAL | 2 refills | Status: DC

## 2019-08-25 NOTE — Telephone Encounter (Signed)
He will need to get his order for shoe inserts by his foot doctor (podiatry/orthopedist) since I do not know what kind of inserts he needs.  The refill on sildenafil will be sent to mail order

## 2019-08-27 ENCOUNTER — Telehealth: Payer: Self-pay

## 2019-08-27 NOTE — Telephone Encounter (Signed)
Error

## 2019-08-27 NOTE — Telephone Encounter (Signed)
Yes, If you can find  What the old DME said and copy it

## 2019-08-27 NOTE — Telephone Encounter (Signed)
Spoke with pt and and he stated that we did it for him back in 2019. There is a DME order in the chart from 2019 for shoe inserts. Pt stated that we do not have to have any of the measurements or know the type of insert he just needs a rx that states pt needs shoe inserts so that insurance will cover them. The place he gets his inserts measures him and knows what type of insert. Is it okay to print off another DME order?

## 2019-08-31 ENCOUNTER — Telehealth: Payer: Self-pay | Admitting: Internal Medicine

## 2019-08-31 NOTE — Addendum Note (Signed)
Addended by: Sandy Salaam on: 08/31/2019 02:07 PM   Modules accepted: Orders

## 2019-08-31 NOTE — Telephone Encounter (Signed)
DME has been printed and placed in quick sign folder for signature.

## 2019-08-31 NOTE — Telephone Encounter (Signed)
Left message for patient to call back and schedule Medicare Annual Wellness Visit (AWV) either virtually or audio only.  Last AWV 05/31/16; please schedule at anytime with Denisa O'Brien-Blaney at [facility].

## 2019-09-01 NOTE — Telephone Encounter (Signed)
Spoke with pt to let him know that the dme order has been placed up front for him to come by and pick up. Pt gave a verbal understanding.

## 2019-09-03 ENCOUNTER — Ambulatory Visit (INDEPENDENT_AMBULATORY_CARE_PROVIDER_SITE_OTHER): Payer: Medicare Other

## 2019-09-03 VITALS — Ht 69.0 in | Wt 191.0 lb

## 2019-09-03 DIAGNOSIS — Z0000 Encounter for general adult medical examination without abnormal findings: Secondary | ICD-10-CM

## 2019-09-03 NOTE — Patient Instructions (Addendum)
George Fowler , Thank you for taking time to come for your Medicare Wellness Visit. I appreciate your ongoing commitment to your health goals. Please review the following plan we discussed and let me know if I can assist you in the future.   Keep all routine maintenance appointments.   Follow up with your doctor as needed.   These are the goals we discussed: Goals     Increase physical activity     Exercise 5 days a week to reach weight below 150lb, 30-60 minutes, moderate pace        This is a list of the screening recommended for you and due dates:  Health Maintenance  Topic Date Due   COVID-19 Vaccine (1) 09/19/2019*   Colon Cancer Screening  09/02/2020*    Hepatitis C: One time screening is recommended by Center for Disease Control  (CDC) for  adults born from [birth year] through 1964.   09/02/2020*   HIV Screening  09/02/2020*   Flu Shot  10/17/2019   Tetanus Vaccine  05/28/2022  *Topic was postponed. The date shown is not the original due date.    Immunization History  Administered Date(s) Administered   Influenza-Unspecified 01/16/2018   Td 05/16/2001   Tdap 05/27/2012   Vaccinations: Pneumococcal vaccine: declined Shingles vaccine: declined Covid-19: declined  Screening Tests Health Maintenance  Topic Date Due   COVID-19 Vaccine (1) 09/19/2019 (Originally 03/12/1978)   COLONOSCOPY  09/02/2020 (Originally 03/11/2017)   Hepatitis C Screening  09/02/2020 (Originally 1966-03-08)   HIV Screening  09/02/2020 (Originally 03/11/1982)   INFLUENZA VACCINE  10/17/2019   TETANUS/TDAP  05/28/2022   Advanced directives: declined.   Conditions/risks identified: none.  Follow up in one year for your annual wellness visit.  Preventive Care 40-64 Years, Male Preventive care refers to lifestyle choices and visits with your health care provider that can promote health and wellness. What does preventive care include?  A yearly physical exam. This is also called an  annual well check.  Dental exams once or twice a year.  Routine eye exams. Ask your health care provider how often you should have your eyes checked.  Personal lifestyle choices, including:  Daily care of your teeth and gums.  Regular physical activity.  Eating a healthy diet.  Avoiding tobacco and drug use.  Limiting alcohol use.  Practicing safe sex.  Taking low-dose aspirin every day starting at age 44. What happens during an annual well check? The services and screenings done by your health care provider during your annual well check will depend on your age, overall health, lifestyle risk factors, and family history of disease. Counseling  Your health care provider may ask you questions about your:  Alcohol use.  Tobacco use.  Drug use.  Emotional well-being.  Home and relationship well-being.  Sexual activity.  Eating habits.  Work and work Statistician. Screening  You may have the following tests or measurements:  Height, weight, and BMI.  Blood pressure.  Lipid and cholesterol levels. These may be checked every 5 years, or more frequently if you are over 38 years old.  Skin check.  Lung cancer screening. You may have this screening every year starting at age 37 if you have a 30-pack-year history of smoking and currently smoke or have quit within the past 15 years.  Fecal occult blood test (FOBT) of the stool. You may have this test every year starting at age 67.  Flexible sigmoidoscopy or colonoscopy. You may have a sigmoidoscopy every 5 years or  a colonoscopy every 10 years starting at age 57.  Prostate cancer screening. Recommendations will vary depending on your family history and other risks.  Hepatitis C blood test.  Hepatitis B blood test.  Sexually transmitted disease (STD) testing.  Diabetes screening. This is done by checking your blood sugar (glucose) after you have not eaten for a while (fasting). You may have this done every 1-3  years. Discuss your test results, treatment options, and if necessary, the need for more tests with your health care provider. Vaccines  Your health care provider may recommend certain vaccines, such as:  Influenza vaccine. This is recommended every year.  Tetanus, diphtheria, and acellular pertussis (Tdap, Td) vaccine. You may need a Td booster every 10 years.  Zoster vaccine. You may need this after age 79.  Pneumococcal 13-valent conjugate (PCV13) vaccine. You may need this if you have certain conditions and have not been vaccinated.  Pneumococcal polysaccharide (PPSV23) vaccine. You may need one or two doses if you smoke cigarettes or if you have certain conditions. Talk to your health care provider about which screenings and vaccines you need and how often you need them. This information is not intended to replace advice given to you by your health care provider. Make sure you discuss any questions you have with your health care provider. Document Released: 03/31/2015 Document Revised: 11/22/2015 Document Reviewed: 01/03/2015 Elsevier Interactive Patient Education  2017 [employer].  Fall Prevention in the Home Falls can cause injuries. They can happen to people of all ages. There are many things you can do to make your home safe and to help prevent falls. What can I do on the outside of my home?  Regularly fix the edges of walkways and driveways and fix any cracks.  Remove anything that might make you trip as you walk through a door, such as a raised step or threshold.  Trim any bushes or trees on the path to your home.  Use bright outdoor lighting.  Clear any walking paths of anything that might make someone trip, such as rocks or tools.  Regularly check to see if handrails are loose or broken. Make sure that both sides of any steps have handrails.  Any raised decks and porches should have guardrails on the edges.  Have any leaves, snow, or ice cleared regularly.  Use  sand or salt on walking paths during winter.  Clean up any spills in your garage right away. This includes oil or grease spills. What can I do in the bathroom?  Use night lights.  Install grab bars by the toilet and in the tub and shower. Do not use towel bars as grab bars.  Use non-skid mats or decals in the tub or shower.  If you need to sit down in the shower, use a plastic, non-slip stool.  Keep the floor dry. Clean up any water that spills on the floor as soon as it happens.  Remove soap buildup in the tub or shower regularly.  Attach bath mats securely with double-sided non-slip rug tape.  Do not have throw rugs and other things on the floor that can make you trip. What can I do in the bedroom?  Use night lights.  Make sure that you have a light by your bed that is easy to reach.  Do not use any sheets or blankets that are too big for your bed. They should not hang down onto the floor.  Have a firm chair that has side arms. You  can use this for support while you get dressed.  Do not have throw rugs and other things on the floor that can make you trip. What can I do in the kitchen?  Clean up any spills right away.  Avoid walking on wet floors.  Keep items that you use a lot in easy-to-reach places.  If you need to reach something above you, use a strong step stool that has a grab bar.  Keep electrical cords out of the way.  Do not use floor polish or wax that makes floors slippery. If you must use wax, use non-skid floor wax.  Do not have throw rugs and other things on the floor that can make you trip. What can I do with my stairs?  Do not leave any items on the stairs.  Make sure that there are handrails on both sides of the stairs and use them. Fix handrails that are broken or loose. Make sure that handrails are as long as the stairways.  Check any carpeting to make sure that it is firmly attached to the stairs. Fix any carpet that is loose or worn.  Avoid  having throw rugs at the top or bottom of the stairs. If you do have throw rugs, attach them to the floor with carpet tape.  Make sure that you have a light switch at the top of the stairs and the bottom of the stairs. If you do not have them, ask someone to add them for you. What else can I do to help prevent falls?  Wear shoes that:  Do not have high heels.  Have rubber bottoms.  Are comfortable and fit you well.  Are closed at the toe. Do not wear sandals.  If you use a stepladder:  Make sure that it is fully opened. Do not climb a closed stepladder.  Make sure that both sides of the stepladder are locked into place.  Ask someone to hold it for you, if possible.  Clearly mark and make sure that you can see:  Any grab bars or handrails.  First and last steps.  Where the edge of each step is.  Use tools that help you move around (mobility aids) if they are needed. These include:  Canes.  Walkers.  Scooters.  Crutches.  Turn on the lights when you go into a dark area. Replace any light bulbs as soon as they burn out.  Set up your furniture so you have a clear path. Avoid moving your furniture around.  If any of your floors are uneven, fix them.  If there are any pets around you, be aware of where they are.  Review your medicines with your doctor. Some medicines can make you feel dizzy. This can increase your chance of falling. Ask your doctor what other things that you can do to help prevent falls. This information is not intended to replace advice given to you by your health care provider. Make sure you discuss any questions you have with your health care provider. Document Released: 12/29/2008 Document Revised: 08/10/2015 Document Reviewed: 04/08/2014 Elsevier Interactive Patient Education  2017 [employer].

## 2019-09-03 NOTE — Progress Notes (Addendum)
Subjective:   George Fowler is a 54 y.o. male who presents for Medicare Annual/Subsequent preventive examination.  Review of Systems:  No ROS.  Medicare Wellness Virtual Visit.  Visual/audio telehealth visit, UTA vital signs.   See social history for additional risk factors.   Cardiac Risk Factors include: advanced age (>77men, >39 women);hypertension;male gender     Objective:    Vitals: Ht 5\' 9"  (1.753 m)   Wt 191 lb (86.6 kg)   BMI 28.21 kg/m   Body mass index is 28.21 kg/m.  Advanced Directives 09/03/2019 09/18/2017 05/31/2016 09/23/2011  Does Patient Have a Medical Advance Directive? No No Yes Patient does not have advance directive  Type of Advance Directive - - Living will;Healthcare Power of Attorney -  Does patient want to make changes to medical advance directive? - - No - Patient declined -  Copy of Healthcare Power of Attorney in Chart? - - No - copy requested -  Would patient like information on creating a medical advance directive? No - Patient declined - No - Patient declined -    Tobacco Social History   Tobacco Use  Smoking Status Former Smoker  . Quit date: 03/18/1990  . Years since quitting: 29.4  Smokeless Tobacco Never Used  Tobacco Comment   quit after smoking x 3 years---in his 20's     Counseling given: Not Answered Comment: quit after smoking x 3 years---in his 20's   Clinical Intake:  Pre-visit preparation completed: Yes        Diabetes: No  How often do you need to have someone help you when you read instructions, pamphlets, or other written materials from your doctor or pharmacy?: 1 - Never  Interpreter Needed?: No     Past Medical History:  Diagnosis Date  . Foot injury    partially disabled from  . Neuropathy of leg    Chronic; bilateral--thinks it was from working on hard floors and favoring right foot  . No pertinent past medical history   . Seasonal allergic rhinitis    with cough  . Umbilical hernia 11/14/2010    Repaired 09/25/11    Past Surgical History:  Procedure Laterality Date  . EYE SURGERY  2005   rt   . FOOT ARTHROPLASTY  2003   lt foot x3  . FOOT SURGERY  2003-lt   plate in big toe; pin in 2nd toe due to 2 ton beam falling on foot (2 surgeries on foot)  . UMBILICAL HERNIA REPAIR  09/25/2011   Procedure: HERNIA REPAIR UMBILICAL ADULT;  Surgeon: 11/26/2011, MD;  Location: [facility];  Service: General;  Laterality: N/A;  umbilical hernia repair   Family History  Problem Relation Age of Onset  . Hypertension Father   . Hypertension Mother   . Diabetes Mother   . Heart disease Paternal Grandmother   . Heart disease Paternal Grandfather   . Cancer Other        uncle---unsure of what kind   Social History   Socioeconomic History  . Marital status: Single    Spouse name: Not on file  . Number of children: Not on file  . Years of education: Not on file  . Highest education level: Not on file  Occupational History  . Occupation: Self-employed  Tobacco Use  . Smoking status: Former Smoker    Quit date: 03/18/1990    Years since quitting: 29.4  . Smokeless tobacco: Never Used  . Tobacco comment: quit after  smoking x 3 years---in his 20's  Vaping Use  . Vaping Use: Never used  Substance and Sexual Activity  . Alcohol use: Yes    Alcohol/week: 1.0 - 2.0 standard drink    Types: 1 - 2 Cans of beer per week    Comment: 1-2 drinks at night on the weekend  . Drug use: No  . Sexual activity: Not Currently  Other Topics Concern  . Not on file  Social History Narrative   Self-employed---uses machine to clean parking lots      Divorced      Regular exercise; some upper body exercise         Social Determinants of Health   Financial Resource Strain:   . Difficulty of Paying Living Expenses:   Food Insecurity:   . Worried About Programme researcher, broadcasting/film/video in the Last Year:   . Barista in the Last Year:   Transportation Needs:   . Freight forwarder  (Medical):   Marland Kitchen Lack of Transportation (Non-Medical):   Physical Activity:   . Days of Exercise per Week:   . Minutes of Exercise per Session:   Stress:   . Feeling of Stress :   Social Connections:   . Frequency of Communication with Friends and Family:   . Frequency of Social Gatherings with Friends and Family:   . Attends Religious Services:   . Active Member of Clubs or Organizations:   . Attends Banker Meetings:   Marland Kitchen Marital Status:     Outpatient Encounter Medications as of 09/03/2019  Medication Sig  . atorvastatin (LIPITOR) 20 MG tablet Take 1 tablet (20 mg total) by mouth daily.  . Calcium Carbonate-Vitamin D (CALCIUM 600+D) 600-400 MG-UNIT per tablet Take 1 tablet by mouth daily.   . fluticasone (FLONASE) 50 MCG/ACT nasal spray Place 2 sprays into both nostrils daily.  Marland Kitchen gabapentin (NEURONTIN) 300 MG capsule Take 3 capsules (900 mg total) by mouth 2 (two) times daily.  . hydrochlorothiazide (HYDRODIURIL) 12.5 MG tablet TAKE 1 TABLET BY MOUTH  DAILY  . losartan (COZAAR) 50 MG tablet Take 2 tablets (100 mg total) by mouth daily.  . sildenafil (REVATIO) 20 MG tablet Take 1 tablet (20 mg total) by mouth 3 (three) times daily.  . Vitamin A 16109 units TABS Take 1 tablet by mouth daily.  . vitamin B-12 (CYANOCOBALAMIN) 500 MCG tablet Take 500 mcg by mouth 2 (two) times daily.     No facility-administered encounter medications on file as of 09/03/2019.    Activities of Daily Living In your present state of health, do you have any difficulty performing the following activities: 09/03/2019  Hearing? N  Vision? N  Difficulty concentrating or making decisions? N  Walking or climbing stairs? N  Dressing or bathing? N  Doing errands, shopping? N  Preparing Food and eating ? N  Using the Toilet? N  In the past six months, have you accidently leaked urine? N  Do you have problems with loss of bowel control? N  Managing your Medications? N  Managing your Finances? N    Housekeeping or managing your Housekeeping? N  Some recent data might be hidden    Patient Care Team: Sherlene Shams, MD as PCP - General (Internal Medicine) Sherlene Shams, MD (Internal Medicine) Lemar Livings Merrily Pew, MD (General Surgery)   Assessment:   This is a routine wellness examination for [place].  I connected with Ludger today by telephone and verified that I  am speaking with the correct person using two identifiers. Location patient: home Location provider: work Persons participating in the virtual visit: patient, Engineer, civil (consulting).    I discussed the limitations, risks, security and privacy concerns of performing an evaluation and management service by telephone and the availability of in person appointments. The patient expressed understanding and verbally consented to this telephonic visit.    Interactive audio and video telecommunications were attempted between this provider and patient, however failed, due to patient having technical difficulties OR patient did not have access to video capability.  We continued and completed visit with audio only.  Some vital signs may be absent or patient reported.   Eye: Visual acuity not assessed. Virtual visit. Followed by their ophthalmologist.  Dental: UTD   Hearing: Demonstrates normal hearing during visit.  Safety:  Patient feels safe at home- yes Patient does have smoke detectors at home- yes Patient does wear sunscreen or protective clothing when in direct sunlight - yes Patient does wear seat belt when in a moving vehicle - yes Patient drives- yes Adequate lighting in walkways free from debris- yes Grab bars and handrails used as appropriate- yes Ambulates with an assistive device- no Cell phone on person when ambulating outside of the home- yes  Medication: Taking as directed and without issues.  Self managed - yes   Covid-19: Precautions and sickness symptoms discussed. Wears mask, social distancing, hand hygiene as  appropriate.  -Covid vaccine- declined  Activities of Daily Living Patient denies needing assistance with: household chores, feeding themselves, getting from bed to chair, getting to the toilet, bathing/showering, dressing, managing money, or preparing meals.   Discussed the importance of a healthy diet, water intake and the benefits of aerobic exercise.  Physical activity- currently works with an outdoor International aid/development worker. Strength training/cardio 3x weekly, 60 minutes.   Diet:  Regular Water: good intake  Other Providers Patient Care Team: Sherlene Shams, MD as PCP - General (Internal Medicine) Sherlene Shams, MD (Internal Medicine) [name], MD (General Surgery) Exercise Activities and Dietary recommendations Current Exercise Habits: Home exercise routine, Type of exercise: strength training/weights;calisthenics, Time (Minutes): 60, Frequency (Times/Week): 3, Weekly Exercise (Minutes/Week): 180  Goals    . Increase physical activity     Exercise 5 days a week to reach weight below 150lb, 30-60 minutes, moderate pace        Fall Risk Fall Risk  09/03/2019 04/21/2019 05/31/2016  Falls in the past year? 0 0 No  Number falls in past yr: 0 - -  Follow up Falls evaluation completed Falls evaluation completed -   Is the patient's home free of loose throw rugs in walkways, pet beds, electrical cords, etc?   Yes      Grab bars in the bathroom? Yes      Handrails on the stairs? Yes      Adequate lighting? Yes     Timed Get Up and Go Performed: No, virtual visit  Depression Screen PHQ 2/9 Scores 09/03/2019 04/21/2019 05/31/2016 05/27/2012  PHQ - 2 Score 0 0 0 0  PHQ- 9 Score - 0 - -    Cognitive Function Patient is alert and oriented x3. Patient denies difficulty focusing or concentrating.  MMSE - Mini Mental State Exam 05/31/2016  Orientation to time 5  Orientation to Place 5  Registration 3  Attention/ Calculation 5  Recall 2  Recall-comments Recalled 2 out of 3  words  Language- name 2 objects 2  Language- repeat 1  Language-  follow 3 step command 3  Language- read & follow direction 1  Write a sentence 1  Copy design 1  Total score 29     6CIT Screen 09/03/2019  What Year? 0 points  What month? 0 points  Months in reverse 0 points  Repeat phrase 0 points    Immunization History  Administered Date(s) Administered  . Influenza-Unspecified 01/16/2018  . Td 05/16/2001  . Tdap 05/27/2012   Screening Tests Health Maintenance  Topic Date Due  . COVID-19 Vaccine (1) 09/19/2019 (Originally 03/12/1978)  . COLONOSCOPY  09/02/2020 (Originally 03/11/2017)  . Hepatitis C Screening  09/02/2020 (Originally 11/12/1965)  . HIV Screening  09/02/2020 (Originally 03/11/1982)  . INFLUENZA VACCINE  10/17/2019  . TETANUS/TDAP  05/28/2022   Cancer Screenings: Lung: Low Dose CT Chest recommended if Age 3-80 years, 30 pack-year currently smoking OR have quit w/in 15years. Patient does not qualify. Colorectal: declined   Additional Screenings: Hepatitis C Screening: declined HIV screening: declined      Plan:   Keep all routine maintenance appointments.   Follow up with your doctor as needed.   I have personally reviewed and noted the following in the patient's chart:   . Medical and social history . Use of alcohol, tobacco or illicit drugs  . Current medications and supplements . Functional ability and status . Nutritional status . Physical activity . Advanced directives . List of other physicians . Hospitalizations, surgeries, and ER visits in previous 12 months . Vitals . Screenings to include cognitive, depression, and falls . Referrals and appointments  I have reviewed and discussed with patient certain preventive protocols, quality metrics, and best practice recommendations. A written personalized care plan for preventive services as well as general preventive health recommendations were provided via mail to patient.      OBrien-Blaney, Verleen Stuckey L, LPN  08/02/6158    I have reviewed the above information and agree with above.   Deborra Medina, MD

## 2019-09-24 ENCOUNTER — Encounter: Payer: Self-pay | Admitting: Internal Medicine

## 2019-09-24 ENCOUNTER — Ambulatory Visit (INDEPENDENT_AMBULATORY_CARE_PROVIDER_SITE_OTHER): Payer: Medicare Other | Admitting: Internal Medicine

## 2019-09-24 ENCOUNTER — Other Ambulatory Visit: Payer: Self-pay

## 2019-09-24 VITALS — BP 126/80 | HR 71 | Temp 98.3°F | Resp 15 | Ht 69.0 in | Wt 193.8 lb

## 2019-09-24 DIAGNOSIS — N181 Chronic kidney disease, stage 1: Secondary | ICD-10-CM | POA: Diagnosis not present

## 2019-09-24 DIAGNOSIS — I10 Essential (primary) hypertension: Secondary | ICD-10-CM | POA: Diagnosis not present

## 2019-09-24 DIAGNOSIS — N529 Male erectile dysfunction, unspecified: Secondary | ICD-10-CM | POA: Diagnosis not present

## 2019-09-24 MED ORDER — SILDENAFIL CITRATE 20 MG PO TABS: 20.0000 mg | ORAL_TABLET | Freq: Three times a day (TID) | ORAL | 3 refills | Status: DC

## 2019-09-24 MED ORDER — GABAPENTIN 300 MG PO CAPS: 900.0000 mg | ORAL_CAPSULE | Freq: Three times a day (TID) | ORAL | 2 refills | Status: DC

## 2019-09-24 NOTE — Progress Notes (Signed)
Subjective:  Patient ID: George Fowler, male    DOB: 1965/10/24  Age: 54 y.o. MRN: [id number]  CC: The primary encounter diagnosis was Chronic kidney disease, stage 1, normal or increased GFR. Diagnoses of Erectile dysfunction, unspecified erectile dysfunction type and Essential hypertension, benign were also pertinent to this visit.  HPI George Fowler presents for FOLLOW UP ON hypertension, hyperlipidemia, prediabetes, CVD and impotence   This visit occurred during the SARS-CoV-2 public health emergency.  Safety protocols were in place, including screening questions prior to the visit, additional usage of staff PPE, and extensive cleaning of exam room while observing appropriate contact time as indicated for disinfecting solutions.   Last seen Feb 2021:  1) ED:   Has been a problem since 2015.  Frustrated by the need to use medication.  Not currently sexually active currently.  Does not wake up with an erection ,  But can achieve one with masturbation. Causes reviewed .    2) HTN:  Hypertension: patient checks blood pressure twice weekly at home.  Readings have been for the most part < 140/80 at rest . Patient is following a reduce salt diet most days and is taking medications as prescribed   3)  Outpatient Medications Prior to Visit  Medication Sig Dispense Refill  . atorvastatin (LIPITOR) 20 MG tablet Take 1 tablet (20 mg total) by mouth daily. 90 tablet 0  . Calcium Carbonate-Vitamin D (CALCIUM 600+D) 600-400 MG-UNIT per tablet Take 1 tablet by mouth daily.     . fluticasone (FLONASE) 50 MCG/ACT nasal spray Place 2 sprays into both nostrils daily. 16 g 6  . losartan (COZAAR) 50 MG tablet Take 2 tablets (100 mg total) by mouth daily. 180 tablet 1  . Vitamin A 01749 units TABS Take 1 tablet by mouth daily.    Marland Kitchen gabapentin (NEURONTIN) 300 MG capsule Take 3 capsules (900 mg total) by mouth 2 (two) times daily. 540 capsule 2  . hydrochlorothiazide (HYDRODIURIL) 12.5 MG tablet TAKE 1  TABLET BY MOUTH  DAILY 90 tablet 3  . sildenafil (REVATIO) 20 MG tablet Take 1 tablet (20 mg total) by mouth 3 (three) times daily. 30 tablet 2  . vitamin B-12 (CYANOCOBALAMIN) 500 MCG tablet Take 500 mcg by mouth 2 (two) times daily.   (Patient not taking: Reported on 09/24/2019)     No facility-administered medications prior to visit.    Review of Systems;  Patient denies headache, fevers, malaise, unintentional weight loss, skin rash, eye pain, sinus congestion and sinus pain, sore throat, dysphagia,  hemoptysis , cough, dyspnea, wheezing, chest pain, palpitations, orthopnea, edema, abdominal pain, nausea, melena, diarrhea, constipation, flank pain, dysuria, hematuria, urinary  Frequency, nocturia, numbness, tingling, seizures,  Focal weakness, Loss of consciousness,  Tremor, insomnia, depression, anxiety, and suicidal ideation.      Objective:  BP 126/80 (BP Location: Left Arm, Patient Position: Sitting, Cuff Size: Normal)   Pulse 71   Temp 98.3 F (36.8 C) (Oral)   Resp 15   Ht 5\' 9"  (1.753 m)   Wt 193 lb 12.8 oz (87.9 kg)   SpO2 97%   BMI 28.62 kg/m   BP Readings from Last 3 Encounters:  09/24/19 126/80  02/06/18 130/90  09/26/17 134/90    Wt Readings from Last 3 Encounters:  09/24/19 193 lb 12.8 oz (87.9 kg)  09/03/19 191 lb (86.6 kg)  04/21/19 191 lb (86.6 kg)    General appearance: alert, cooperative and appears stated age Ears: normal TM's and  external ear canals both ears Throat: lips, mucosa, and tongue normal; teeth and gums normal Neck: no adenopathy, no carotid bruit, supple, symmetrical, trachea midline and thyroid not enlarged, symmetric, no tenderness/mass/nodules Back: symmetric, no curvature. ROM normal. No CVA tenderness. Lungs: clear to auscultation bilaterally Heart: regular rate and rhythm, S1, S2 normal, no murmur, click, rub or gallop Abdomen: soft, non-tender; bowel sounds normal; no masses,  no organomegaly Pulses: 2+ and symmetric Skin: Skin  color, texture, turgor normal. No rashes or lesions Lymph nodes: Cervical, supraclavicular, and axillary nodes normal.  Lab Results  Component Value Date   HGBA1C 6.5 05/05/2019   HGBA1C 5.5 02/06/2018   HGBA1C 5.8 (A) 08/06/2017    Lab Results  Component Value Date   CREATININE 1.47 05/18/2019   CREATININE 1.79 (H) 05/05/2019   CREATININE 1.10 02/06/2018    Lab Results  Component Value Date   WBC 9.6 10/30/2016   HGB 15.3 10/30/2016   HCT 46.1 10/30/2016   PLT 187.0 10/30/2016   GLUCOSE 99 05/18/2019   CHOL 156 05/05/2019   TRIG 305.0 (H) 05/05/2019   HDL 33.00 (L) 05/05/2019   LDLDIRECT 63.0 05/05/2019   LDLCALC 72 02/06/2018   ALT 27 05/05/2019   AST 20 05/05/2019   NA 139 05/18/2019   K 4.5 05/18/2019   CL 107 05/18/2019   CREATININE 1.47 05/18/2019   BUN 18 05/18/2019   CO2 29 05/18/2019   TSH 1.03 07/31/2017   PSA 1.60 05/05/2019   HGBA1C 6.5 05/05/2019   MICROALBUR <0.7 05/18/2019    DG Tibia/Fibula Right  Result Date: 09/19/2017 CLINICAL DATA:  Infection/cellulitis. EXAM: RIGHT TIBIA AND FIBULA - 2 VIEW COMPARISON:  No recent prior. FINDINGS: No acute bony or joint abnormality. No evidence of fracture or dislocation. Diffuse soft tissue swelling. IMPRESSION: Diffuse soft tissue swelling.  No acute bony or joint abnormality. Electronically Signed   By: Maisie Fus  Register   On: 09/19/2017 13:21    Assessment & Plan:   Problem List Items Addressed This Visit      Unprioritized   Essential hypertension, benign    Suspending hctz.  Repeat BP check in 1-2 weeks       Relevant Medications   sildenafil (REVATIO) 20 MG tablet   Erectile dysfunction    contineu prn sildenafil.  Refills of the 20 mg dose given.  reminded that the starting dose for ED is 50 mg        Other Visit Diagnoses    Chronic kidney disease, stage 1, normal or increased GFR    -  Primary   Relevant Orders   Comprehensive metabolic panel      I have discontinued George Fowler.  George Fowler's hydrochlorothiazide. I have also changed his gabapentin. Additionally, I am having him maintain his Calcium Carbonate-Vitamin D, vitamin B-12, Vitamin A, atorvastatin, fluticasone, losartan, and sildenafil.  Meds ordered this encounter  Medications  . sildenafil (REVATIO) 20 MG tablet    Sig: Take 1 tablet (20 mg total) by mouth 3 (three) times daily.    Dispense:  270 tablet    Refill:  3  . gabapentin (NEURONTIN) 300 MG capsule    Sig: Take 3 capsules (900 mg total) by mouth 3 (three) times daily.    Dispense:  810 capsule    Refill:  2    Medications Discontinued During This Encounter  Medication Reason  . sildenafil (REVATIO) 20 MG tablet   . gabapentin (NEURONTIN) 300 MG capsule Reorder  . hydrochlorothiazide (HYDRODIURIL) 12.5 MG  tablet     Follow-up: No follow-ups on file.   Sherlene Shams, MD

## 2019-09-24 NOTE — Patient Instructions (Addendum)
The medication for erectile dysfunction is written for a different condition to save you money.  The dose for ED is usually 50 to 100 mg PER ERECTION . ( NOT PER DAY!)   DO NOT TAKE IT UNLESS YOU WANT TO HAVE AN ERECTION  YOU DID NOT START TAKING HCTZ (WATER PILL)  UNTIL 2019,  WELL AFTER YOUR PROBLEM STARTED   STOP THE HCTZ AND RETURN IN 2 WEEKS FOR LABS    Erectile Dysfunction Erectile dysfunction (ED) is the inability to get or keep an erection in order to have sexual intercourse. Erectile dysfunction may include:  Inability to get an erection.  Lack of enough hardness of the erection to allow penetration.  Loss of the erection before sex is finished. What are the causes? This condition may be caused by:  Certain medicines, such as: ? Pain relievers. ? Antihistamines. ? Antidepressants. ? Blood pressure medicines. ? Water pills (diuretics). ? Ulcer medicines. ? Muscle relaxants. ? Drugs.  Excessive drinking.  Psychological causes, such as: ? Anxiety. ? Depression. ? Sadness. ? Exhaustion. ? Performance fear. ? Stress.  Physical causes, such as: ? Artery problems. This may include diabetes, smoking, liver disease, or atherosclerosis. ? High blood pressure. ? Hormonal problems, such as low testosterone. ? Obesity. ? Nerve problems. This may include back or pelvic injuries, diabetes mellitus, multiple sclerosis, or Parkinson disease. What are the signs or symptoms? Symptoms of this condition include:  Inability to get an erection.  Lack of enough hardness of the erection to allow penetration.  Loss of the erection before sex is finished.  Normal erections at some times, but with frequent unsatisfactory episodes.  Low sexual satisfaction in either partner due to erection problems.  A curved penis occurring with erection. The curve may cause pain or the penis may be too curved to allow for intercourse.  Never having nighttime erections. How is this  diagnosed? This condition is often diagnosed by:  Performing a physical exam to find other diseases or specific problems with the penis.  Asking you detailed questions about the problem.  Performing blood tests to check for diabetes mellitus or to measure hormone levels.  Performing other tests to check for underlying health conditions.  Performing an ultrasound exam to check for scarring.  Performing a test to check blood flow to the penis.  Doing a sleep study at home to measure nighttime erections. How is this treated? This condition may be treated by:  Medicine taken by mouth to help you achieve an erection (oral medicine).  Hormone replacement therapy to replace low testosterone levels.  Medicine that is injected into the penis. Your health care provider may instruct you how to give yourself these injections at home.  Vacuum pump. This is a pump with a ring on it. The pump and ring are placed on the penis and used to create pressure that helps the penis become erect.  Penile implant surgery. In this procedure, you may receive: ? An inflatable implant. This consists of cylinders, a pump, and a reservoir. The cylinders can be inflated with a fluid that helps to create an erection, and they can be deflated after intercourse. ? A semi-rigid implant. This consists of two silicone rubber rods. The rods provide some rigidity. They are also flexible, so the penis can both curve downward in its normal position and become straight for sexual intercourse.  Blood vessel surgery, to improve blood flow to the penis. During this procedure, a blood vessel from a different part  of the body is placed into the penis to allow blood to flow around (bypass) damaged or blocked blood vessels.  Lifestyle changes, such as exercising more, losing weight, and quitting smoking. Follow these instructions at home: Medicines   Take over-the-counter and prescription medicines only as told by your health  care provider. Do not increase the dosage without first discussing it with your health care provider.  If you are using self-injections, perform injections as directed by your health care provider. Make sure to avoid any veins that are on the surface of the penis. After giving an injection, apply pressure to the injection site for 5 minutes. General instructions  Exercise regularly, as directed by your health care provider. Work with your health care provider to lose weight, if needed.  Do not use any products that contain nicotine or tobacco, such as cigarettes and e-cigarettes. If you need help quitting, ask your health care provider.  Before using a vacuum pump, read the instructions that come with the pump and discuss any questions with your health care provider.  Keep all follow-up visits as told by your health care provider. This is important. Contact a health care provider if:  You feel nauseous.  You vomit. Get help right away if:  You are taking oral or injectable medicines and you have an erection that lasts longer than 4 hours. If your health care provider is unavailable, go to the nearest emergency room for evaluation. An erection that lasts much longer than 4 hours can result in permanent damage to your penis.  You have severe pain in your groin or abdomen.  You develop redness or severe swelling of your penis.  You have redness spreading up into your groin or lower abdomen.  You are unable to urinate.  You experience chest pain or a rapid heart beat (palpitations) after taking oral medicines. Summary  Erectile dysfunction (ED) is the inability to get or keep an erection during sexual intercourse. This problem can usually be treated successfully.  This condition is diagnosed based on a physical exam, your symptoms, and tests to determine the cause. Treatment varies depending on the cause, and may include medicines, hormone therapy, surgery, or vacuum pump.  You may  need follow-up visits to make sure that you are using your medicines or devices correctly.  Get help right away if you are taking or injecting medicines and you have an erection that lasts longer than 4 hours. This information is not intended to replace advice given to you by your health care provider. Make sure you discuss any questions you have with your health care provider. Document Revised: 02/14/2017 Document Reviewed: 03/20/2016 Elsevier Patient Education  2020 [employer].

## 2019-09-26 NOTE — Assessment & Plan Note (Signed)
Suspending hctz.  Repeat BP check in 1-2 weeks

## 2019-09-26 NOTE — Assessment & Plan Note (Signed)
Attributed to crush inujry of left foot in 2003 and continued favoring of right foot.  No formal diagnosis ,  Continue gabapentin.   

## 2019-09-26 NOTE — Assessment & Plan Note (Signed)
contineu prn sildenafil.  Refills of the 20 mg dose given.  reminded that the starting dose for ED is 50 mg

## 2019-10-07 ENCOUNTER — Other Ambulatory Visit: Payer: Self-pay

## 2019-10-07 ENCOUNTER — Other Ambulatory Visit: Payer: Self-pay | Admitting: Internal Medicine

## 2019-10-07 ENCOUNTER — Ambulatory Visit: Payer: Medicare Other

## 2019-10-07 ENCOUNTER — Other Ambulatory Visit (INDEPENDENT_AMBULATORY_CARE_PROVIDER_SITE_OTHER): Payer: Medicare Other

## 2019-10-07 VITALS — BP 159/90 | HR 61

## 2019-10-07 DIAGNOSIS — I10 Essential (primary) hypertension: Secondary | ICD-10-CM

## 2019-10-07 DIAGNOSIS — N181 Chronic kidney disease, stage 1: Secondary | ICD-10-CM | POA: Diagnosis not present

## 2019-10-07 LAB — COMPREHENSIVE METABOLIC PANEL
ALT: 20 U/L (ref 0–53)
AST: 17 U/L (ref 0–37)
Albumin: 4.2 g/dL (ref 3.5–5.2)
Alkaline Phosphatase: 73 U/L (ref 39–117)
BUN: 17 mg/dL (ref 6–23)
CO2: 29 mEq/L (ref 19–32)
Calcium: 9.1 mg/dL (ref 8.4–10.5)
Chloride: 105 mEq/L (ref 96–112)
Creatinine, Ser: 1.51 mg/dL — ABNORMAL HIGH (ref 0.40–1.50)
GFR: 48.66 mL/min — ABNORMAL LOW (ref >60.00)
Glucose, Bld: 147 mg/dL — ABNORMAL HIGH (ref 70–99)
Potassium: 4.3 mEq/L (ref 3.5–5.1)
Sodium: 137 mEq/L (ref 135–145)
Total Bilirubin: 0.8 mg/dL (ref 0.2–1.2)
Total Protein: 6.4 g/dL (ref 6.0–8.3)

## 2019-10-07 MED ORDER — LOSARTAN POTASSIUM-HCTZ 100-25 MG PO TABS
1.0000 | ORAL_TABLET | Freq: Every day | ORAL | 3 refills | Status: DC
Start: 2019-10-07 — End: 2019-10-20

## 2019-10-07 NOTE — Progress Notes (Signed)
Patient here for nurse visit BP check per order from Dr. Darrick Huntsman  Patient reports compliance with prescribed BP medications: yes  Last dose of BP medication: today @ 11:30 am   BP Readings from Last 3 Encounters:  09/24/19 126/80  02/06/18 130/90  09/26/17 134/90   Pulse Readings from Last 3 Encounters:  09/24/19 71  02/06/18 67  09/26/17 75   Patient stated his BP was taken at home by a nurse @ 5:30 pm on Monday and it was 138/78 and pulse was 88.     Per Dr. Darrick Huntsman she was increasing his HCTZ to 25 mg from 12.5 mg  and doing a combo of Losartan/HCTZ 100/25 mg so that the patient had to take only one pill daily, I informed the patient and he stated he would do a f/up with the provider to discuss.  Erle Guster,cma    Allean Found, CMA

## 2019-10-07 NOTE — Assessment & Plan Note (Signed)
RN visit for BP check reviewed and BP elevated on 12.5 mh hctz and 100 mg losartan.  Changing med to losartan/hct 100/25.  Repeat RN visit one week for BP echeck

## 2019-10-11 ENCOUNTER — Telehealth: Payer: Self-pay

## 2019-10-11 NOTE — Telephone Encounter (Signed)
LMTCB. Need to let pt know that I spoke with [facility] and they dispensed his medication so he should be getting it in the mail soon.

## 2019-10-11 NOTE — Telephone Encounter (Signed)
error 

## 2019-10-20 ENCOUNTER — Other Ambulatory Visit: Payer: Self-pay

## 2019-10-20 ENCOUNTER — Ambulatory Visit (INDEPENDENT_AMBULATORY_CARE_PROVIDER_SITE_OTHER): Payer: Medicare Other | Admitting: Internal Medicine

## 2019-10-20 ENCOUNTER — Encounter: Payer: Self-pay | Admitting: Internal Medicine

## 2019-10-20 DIAGNOSIS — I672 Cerebral atherosclerosis: Secondary | ICD-10-CM

## 2019-10-20 DIAGNOSIS — I10 Essential (primary) hypertension: Secondary | ICD-10-CM

## 2019-10-20 DIAGNOSIS — E785 Hyperlipidemia, unspecified: Secondary | ICD-10-CM

## 2019-10-20 MED ORDER — ATORVASTATIN CALCIUM 20 MG PO TABS: 20.0000 mg | ORAL_TABLET | Freq: Every day | ORAL | 2 refills | Status: DC

## 2019-10-20 NOTE — Progress Notes (Signed)
Subjective:  Patient ID: George Fowler, male    DOB: January 31, 1966  Age: 54 y.o. MRN: [id number]  CC: Diagnoses of Hyperlipidemia LDL goal <100, Essential hypertension, benign, and Cerebral atherosclerosis were pertinent to this visit.  HPI George Fowler presents for 2 WEEK FOLLOW UP  ON HYPERTENSION  BPS HAVE IMPROVED  ON  100 mg LOSARTAN AND 25 mg HCTZ.  Tolerating increased doses but does not want to change to combination pill ("I am resistant to change")  Kidney function: reviewed last several years of labs.  Cr has improved since Februrary .    Had a tooth pulled today   Cerebral atherosclerosis:  Has not taken lipitor in over a year due to being lost to follow up.  Reviewed reasons for using it.     This visit occurred during the SARS-CoV-2 public health emergency.  Safety protocols were in place, including screening questions prior to the visit, additional usage of staff PPE, and extensive cleaning of exam room while observing appropriate contact time as indicated for disinfecting solutions.      Outpatient Medications Prior to Visit  Medication Sig Dispense Refill  . Calcium Carbonate-Vitamin D (CALCIUM 600+D) 600-400 MG-UNIT per tablet Take 1 tablet by mouth daily.     . fluticasone (FLONASE) 50 MCG/ACT nasal spray Place 2 sprays into both nostrils daily. 16 g 6  . gabapentin (NEURONTIN) 300 MG capsule Take 3 capsules (900 mg total) by mouth 3 (three) times daily. 810 capsule 2  . sildenafil (REVATIO) 20 MG tablet Take 1 tablet (20 mg total) by mouth 3 (three) times daily. 270 tablet 3  . Vitamin A 61950 units TABS Take 1 tablet by mouth daily.    . vitamin B-12 (CYANOCOBALAMIN) 500 MCG tablet Take 500 mcg by mouth 2 (two) times daily.      Marland Kitchen losartan-hydrochlorothiazide (HYZAAR) 100-25 MG tablet Take 1 tablet by mouth daily. 90 tablet 3  . atorvastatin (LIPITOR) 20 MG tablet Take 1 tablet (20 mg total) by mouth daily. (Patient not taking: Reported on 10/20/2019) 90 tablet  0   No facility-administered medications prior to visit.    Review of Systems;  Patient denies headache, fevers, malaise, unintentional weight loss, skin rash, eye pain, sinus congestion and sinus pain, sore throat, dysphagia,  hemoptysis , cough, dyspnea, wheezing, chest pain, palpitations, orthopnea, edema, abdominal pain, nausea, melena, diarrhea, constipation, flank pain, dysuria, hematuria, urinary  Frequency, nocturia, numbness, tingling, seizures,  Focal weakness, Loss of consciousness,  Tremor, insomnia, depression, anxiety, and suicidal ideation.      Objective:  BP 120/74 (BP Location: Left Arm, Patient Position: Sitting, Cuff Size: Normal)   Pulse 74   Temp 98.3 F (36.8 C) (Oral)   Resp 14   Ht 5\' 9"  (1.753 m)   Wt 184 lb 12.8 oz (83.8 kg)   SpO2 98%   BMI 27.29 kg/m   BP Readings from Last 3 Encounters:  10/20/19 120/74  10/07/19 (!) 159/90  09/24/19 126/80    Wt Readings from Last 3 Encounters:  10/20/19 184 lb 12.8 oz (83.8 kg)  09/24/19 193 lb 12.8 oz (87.9 kg)  09/03/19 191 lb (86.6 kg)    General appearance: alert, cooperative and appears stated age Ears: normal TM's and external ear canals both ears Throat: lips, mucosa, and tongue normal; teeth and gums normal Neck: no adenopathy, no carotid bruit, supple, symmetrical, trachea midline and thyroid not enlarged, symmetric, no tenderness/mass/nodules Back: symmetric, no curvature. ROM normal. No CVA tenderness. Lungs:  clear to auscultation bilaterally Heart: regular rate and rhythm, S1, S2 normal, no murmur, click, rub or gallop Abdomen: soft, non-tender; bowel sounds normal; no masses,  no organomegaly Pulses: 2+ and symmetric Skin: Skin color, texture, turgor normal. No rashes or lesions Lymph nodes: Cervical, supraclavicular, and axillary nodes normal.  Lab Results  Component Value Date   HGBA1C 6.5 05/05/2019   HGBA1C 5.5 02/06/2018   HGBA1C 5.8 (A) 08/06/2017    Lab Results  Component Value  Date   CREATININE 1.51 (H) 10/07/2019   CREATININE 1.47 05/18/2019   CREATININE 1.79 (H) 05/05/2019    Lab Results  Component Value Date   WBC 9.6 10/30/2016   HGB 15.3 10/30/2016   HCT 46.1 10/30/2016   PLT 187.0 10/30/2016   GLUCOSE 147 (H) 10/07/2019   CHOL 156 05/05/2019   TRIG 305.0 (H) 05/05/2019   HDL 33.00 (L) 05/05/2019   LDLDIRECT 63.0 05/05/2019   LDLCALC 72 02/06/2018   ALT 20 10/07/2019   AST 17 10/07/2019   NA 137 10/07/2019   K 4.3 10/07/2019   CL 105 10/07/2019   CREATININE 1.51 (H) 10/07/2019   BUN 17 10/07/2019   CO2 29 10/07/2019   TSH 1.03 07/31/2017   PSA 1.60 05/05/2019   HGBA1C 6.5 05/05/2019   MICROALBUR <0.7 05/18/2019    DG Tibia/Fibula Right  Result Date: 09/19/2017 CLINICAL DATA:  Infection/cellulitis. EXAM: RIGHT TIBIA AND FIBULA - 2 VIEW COMPARISON:  No recent prior. FINDINGS: No acute bony or joint abnormality. No evidence of fracture or dislocation. Diffuse soft tissue swelling. IMPRESSION: Diffuse soft tissue swelling.  No acute bony or joint abnormality. Electronically Signed   By: Maisie Fus  Register   On: 09/19/2017 13:21    Assessment & Plan:   Problem List Items Addressed This Visit      Unprioritized   Benign hypertension with CKD (chronic kidney disease) stage I    Now at goal on 100 mg losartan and 25 mg daily . He prefers to keep the medications separate      Relevant Medications   atorvastatin (LIPITOR) 20 MG tablet   Cerebral atherosclerosis    Noted on previous head CT.  Statin recommended.       Relevant Medications   atorvastatin (LIPITOR) 20 MG tablet   Hyperlipidemia LDL goal <100    Recommend resuming statin therapy after reviewing atherosclerosis noted in previous CT       Relevant Medications   atorvastatin (LIPITOR) 20 MG tablet      I have discontinued Veverly Fells. Mantel's losartan-hydrochlorothiazide. I have also changed his atorvastatin. Additionally, I am having him maintain his Calcium  Carbonate-Vitamin D, vitamin B-12, Vitamin A, fluticasone, sildenafil, and gabapentin.  Meds ordered this encounter  Medications  . atorvastatin (LIPITOR) 20 MG tablet    Sig: Take 1 tablet (20 mg total) by mouth at bedtime.    Dispense:  90 tablet    Refill:  2    Medications Discontinued During This Encounter  Medication Reason  . losartan-hydrochlorothiazide (HYZAAR) 100-25 MG tablet   . atorvastatin (LIPITOR) 20 MG tablet Reorder    Follow-up: No follow-ups on file.   Sherlene Shams, MD

## 2019-10-20 NOTE — Patient Instructions (Addendum)
Your blood pressure is at goal of 130/80 or less   You can continue taking 2 of the 50 mg losartan tablets  Daily ,  And 2 of the 12.5 mg hctz tablet daily   Let me know if  you want to switch to 1 losartan and 1 hctz daily   OR to The combination pill  Your kidney function is better than it was in February.  We will recheck it in 6 months

## 2019-10-21 DIAGNOSIS — I672 Cerebral atherosclerosis: Secondary | ICD-10-CM | POA: Insufficient documentation

## 2019-10-21 NOTE — Assessment & Plan Note (Signed)
Noted on previous head CT.  Statin recommended.

## 2019-10-21 NOTE — Assessment & Plan Note (Signed)
Now at goal on 100 mg losartan and 25 mg daily . He prefers to keep the medications separate

## 2019-10-21 NOTE — Assessment & Plan Note (Signed)
Recommend resuming statin therapy after reviewing atherosclerosis noted in previous CT

## 2020-02-05 ENCOUNTER — Other Ambulatory Visit: Payer: Self-pay | Admitting: Internal Medicine

## 2020-02-21 ENCOUNTER — Other Ambulatory Visit: Payer: Self-pay | Admitting: Internal Medicine

## 2020-04-06 ENCOUNTER — Telehealth: Payer: Self-pay | Admitting: Internal Medicine

## 2020-04-06 DIAGNOSIS — I129 Hypertensive chronic kidney disease with stage 1 through stage 4 chronic kidney disease, or unspecified chronic kidney disease: Secondary | ICD-10-CM

## 2020-04-07 ENCOUNTER — Other Ambulatory Visit: Payer: Self-pay | Admitting: Internal Medicine

## 2020-04-07 MED ORDER — LOSARTAN POTASSIUM-HCTZ 100-25 MG PO TABS: 1.0000 | ORAL_TABLET | Freq: Every day | ORAL | 0 refills | Status: DC

## 2020-04-07 NOTE — Telephone Encounter (Signed)
Not sure why these were refused and I don't see them in pt's current medication list, but according to last office note it states that bp was at goal on 100 mg losartan and 12.5 mg HCTZ and that he would like to continue taking them as single pills rather than the combo pill.

## 2020-04-07 NOTE — Addendum Note (Signed)
Addended by: Sherlene Shams on: 04/07/2020 05:06 PM   Modules accepted: Orders

## 2020-04-07 NOTE — Telephone Encounter (Signed)
Pt wants to know why his prescriptions was rejected. He needs them sent to [facility]

## 2020-04-07 NOTE — Telephone Encounter (Signed)
It does not come in that combination.  100/25 combination dose  [facility]  Needs cmet this month .please scheudle lab visit

## 2020-04-21 ENCOUNTER — Encounter: Payer: Self-pay | Admitting: Internal Medicine

## 2020-04-21 ENCOUNTER — Other Ambulatory Visit: Payer: Self-pay

## 2020-04-21 ENCOUNTER — Ambulatory Visit (INDEPENDENT_AMBULATORY_CARE_PROVIDER_SITE_OTHER): Payer: Medicare Other | Admitting: Internal Medicine

## 2020-04-21 VITALS — BP 130/72 | HR 82 | Temp 98.1°F | Ht 69.02 in | Wt 194.2 lb

## 2020-04-21 DIAGNOSIS — E1122 Type 2 diabetes mellitus with diabetic chronic kidney disease: Secondary | ICD-10-CM

## 2020-04-21 DIAGNOSIS — E1165 Type 2 diabetes mellitus with hyperglycemia: Secondary | ICD-10-CM | POA: Diagnosis not present

## 2020-04-21 DIAGNOSIS — Z125 Encounter for screening for malignant neoplasm of prostate: Secondary | ICD-10-CM | POA: Diagnosis not present

## 2020-04-21 DIAGNOSIS — Z0000 Encounter for general adult medical examination without abnormal findings: Secondary | ICD-10-CM | POA: Diagnosis not present

## 2020-04-21 DIAGNOSIS — E785 Hyperlipidemia, unspecified: Secondary | ICD-10-CM

## 2020-04-21 DIAGNOSIS — I672 Cerebral atherosclerosis: Secondary | ICD-10-CM

## 2020-04-21 DIAGNOSIS — I129 Hypertensive chronic kidney disease with stage 1 through stage 4 chronic kidney disease, or unspecified chronic kidney disease: Secondary | ICD-10-CM | POA: Diagnosis not present

## 2020-04-21 DIAGNOSIS — N182 Chronic kidney disease, stage 2 (mild): Secondary | ICD-10-CM

## 2020-04-21 DIAGNOSIS — N181 Chronic kidney disease, stage 1: Secondary | ICD-10-CM

## 2020-04-21 LAB — COMPREHENSIVE METABOLIC PANEL
ALT: 37 U/L (ref 0–53)
AST: 28 U/L (ref 0–37)
Albumin: 4.2 g/dL (ref 3.5–5.2)
Alkaline Phosphatase: 85 U/L (ref 39–117)
BUN: 16 mg/dL (ref 6–23)
CO2: 30 mEq/L (ref 19–32)
Calcium: 9.4 mg/dL (ref 8.4–10.5)
Chloride: 104 mEq/L (ref 96–112)
Creatinine, Ser: 1.55 mg/dL — ABNORMAL HIGH (ref 0.40–1.50)
GFR: 50.93 mL/min — ABNORMAL LOW (ref >60.00)
Glucose, Bld: 66 mg/dL — ABNORMAL LOW (ref 70–99)
Potassium: 3.7 mEq/L (ref 3.5–5.1)
Sodium: 140 mEq/L (ref 135–145)
Total Bilirubin: 0.4 mg/dL (ref 0.2–1.2)
Total Protein: 6.4 g/dL (ref 6.0–8.3)

## 2020-04-21 LAB — LIPID PANEL
Cholesterol: 151 mg/dL (ref 0–200)
HDL: 39.9 mg/dL (ref >39.00)
LDL Cholesterol: 77 mg/dL (ref 0–99)
NonHDL: 111.55
Total CHOL/HDL Ratio: 4
Triglycerides: 172 mg/dL — ABNORMAL HIGH (ref 0.0–149.0)
VLDL: 34.4 mg/dL (ref 0.0–40.0)

## 2020-04-21 LAB — HEMOGLOBIN A1C: Hgb A1c MFr Bld: 5.9 % (ref 4.6–6.5)

## 2020-04-21 LAB — MICROALBUMIN / CREATININE URINE RATIO
Creatinine,U: 58.3 mg/dL
Microalb Creat Ratio: 1.2 mg/g (ref 0.0–30.0)
Microalb, Ur: <0.7 mg/dL (ref 0.0–1.9)

## 2020-04-21 LAB — PSA, MEDICARE: PSA: 1.31 ng/ml (ref 0.10–4.00)

## 2020-04-21 MED ORDER — LOSARTAN POTASSIUM 50 MG PO TABS
50.0000 mg | ORAL_TABLET | Freq: Every day | ORAL | 2 refills | Status: DC
Start: 2020-04-21 — End: 2020-04-23

## 2020-04-21 NOTE — Progress Notes (Signed)
_  Subjective:  Patient ID: George Fowler, male    DOB: 1966-01-22  Age: 55 y.o. 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  HPI George Fowler presents for  follow up on  hypertension,  Hyperlipidemia and recent labs diagnostic of Type 2 DM   This visit occurred during the SARS-CoV-2 public health emergency.  Safety protocols were in place, including screening questions prior to the visit, additional usage of staff PPE, and extensive cleaning of exam room while observing appropriate contact time as indicated for disinfecting solutions.    Patient has received NO  doses of the available COVID 19 vaccines by choice.  He also declines influenza and pneumonia vaccines today, despite recommendations from me based on his medial history which now includes Type 2 DM.    Patient states that he continues to mask   outside of the home except when walking in yard or at safe distances from others .  Patient denies any change in mood or development of unhealthy behaviors resuting from the pandemic's restriction of activities and socialization.    Intermittent Right elbow pain occurred 1 week ago, hurts only when he applies  pressure on his  flexed elbow .  Intermittent . No radiation  To hand,  No numbness or tingling in hand.  No history of injury  Prior to onset , but had been working on his car over his head while the car was on a lift.    HTN:  He has been taking losartan 100 MG DAILY (2 50 MG TABLETS)  AND /ONE   HCTZ 12.5 MG DAILY ; HIS  home readings have been 130/80 or less    Outpatient Medications Prior to Visit  Medication Sig Dispense Refill  .  atorvastatin (LIPITOR) 20 MG tablet Take 1 tablet (20 mg total) by mouth at bedtime. 90 tablet 2  . Calcium Carbonate-Vitamin D 600-400 MG-UNIT tablet Take 1 tablet by mouth daily.    . fluticasone (FLONASE) 50 MCG/ACT nasal spray Place 2 sprays into both nostrils daily. 16 g 6  . gabapentin (NEURONTIN) 300 MG capsule Take 3 capsules (900 mg total) by mouth 3 (three) times daily. 810 capsule 2  . hydrochlorothiazide (HYDRODIURIL) 12.5 MG tablet TAKE 1 TABLET BY MOUTH  DAILY 90 tablet 3  . sildenafil (REVATIO) 20 MG tablet Take 1 tablet (20 mg total) by mouth 3 (three) times daily. 270 tablet 3  . Vitamin A 59163 units TABS Take 1 tablet by mouth daily.    . vitamin B-12 (CYANOCOBALAMIN) 500 MCG tablet Take 500 mcg by mouth 2 (two) times daily.      Marland Kitchen losartan (COZAAR) 50 MG tablet Take 50 mg by mouth 2 (two) times daily.    Marland Kitchen losartan-hydrochlorothiazide (HYZAAR) 100-25 MG tablet Take 1 tablet by mouth daily. (Patient not taking: Reported on 04/21/2020) 90 tablet 0   No facility-administered medications prior to visit.    Review of Systems;  Patient denies headache, fevers, malaise, unintentional weight loss, skin rash, eye pain, sinus congestion and sinus pain, sore throat, dysphagia,  hemoptysis , cough, dyspnea, wheezing, chest pain, palpitations, orthopnea, edema, abdominal pain, nausea, melena, diarrhea, constipation, flank pain, dysuria, hematuria, urinary  Frequency, nocturia, numbness, tingling, seizures,  Focal weakness, Loss of consciousness,  Tremor, insomnia, depression, anxiety, and suicidal ideation.      Objective:  BP 130/72 (BP Location: Left Arm, Patient Position: Sitting)   Pulse 82   Temp 98.1 F (36.7 C)   Ht 5' 9.02" (1.753 m)   Wt 194 lb 3.2 oz (88.1 kg)   SpO2 95%   BMI 28.67 kg/m   BP Readings from Last 3 Encounters:  04/21/20 130/72  10/20/19 120/74  10/07/19 (!) 159/90    Wt Readings from Last 3 Encounters:  04/21/20 194 lb 3.2 oz (88.1 kg)  10/20/19  184 lb 12.8 oz (83.8 kg)  09/24/19 193 lb 12.8 oz (87.9 kg)    General appearance: alert, cooperative and appears stated age Ears: normal TM's and external ear canals both ears Throat: lips, mucosa, and tongue normal; teeth and gums normal Neck: no adenopathy, no carotid bruit, supple, symmetrical, trachea midline and thyroid not enlarged, symmetric, no tenderness/mass/nodules Back: symmetric, no curvature. ROM normal. No CVA tenderness. Lungs: clear to auscultation bilaterally Heart: regular rate and rhythm, S1, S2 normal, no murmur, click, rub or gallop Abdomen: soft, non-tender; bowel sounds normal; no masses,  no organomegaly Pulses: 2+ and symmetric Skin: Skin color, texture, turgor normal. No rashes or lesions Lymph nodes: Cervical, supraclavicular, and axillary nodes normal.  Lab Results  Component Value Date   HGBA1C 5.9 04/21/2020   HGBA1C 6.5 05/05/2019   HGBA1C 5.5 02/06/2018    Lab Results  Component Value Date   CREATININE 1.55 (H) 04/21/2020   CREATININE 1.51 (H) 10/07/2019   CREATININE 1.47 05/18/2019    Lab Results  Component Value Date   WBC 9.6 10/30/2016   HGB 15.3 10/30/2016   HCT 46.1 10/30/2016   PLT 187.0 10/30/2016   GLUCOSE 66 (L) 04/21/2020   CHOL 151 04/21/2020   TRIG 172.0 (H) 04/21/2020   HDL 39.90 04/21/2020   LDLDIRECT 63.0 05/05/2019   LDLCALC 77 04/21/2020   ALT 37 04/21/2020   AST 28 04/21/2020   NA 140 04/21/2020   K 3.7 04/21/2020   CL 104 04/21/2020   CREATININE 1.55 (H) 04/21/2020   BUN 16 04/21/2020   CO2 30 04/21/2020   TSH 1.03 07/31/2017   PSA 1.31 04/21/2020   HGBA1C 5.9 04/21/2020   MICROALBUR <0.7 04/21/2020    DG Tibia/Fibula Right  Result Date: 09/19/2017 CLINICAL DATA:  Infection/cellulitis. EXAM: RIGHT TIBIA AND FIBULA - 2 VIEW COMPARISON:  No recent prior. FINDINGS: No acute bony or joint abnormality. No evidence of fracture or dislocation. Diffuse soft tissue swelling. IMPRESSION: Diffuse soft tissue swelling.   No acute bony or joint abnormality. 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 He prefers to keep the medications separate  Lab Results  Component Value Date   CREATININE 1.55 (H) 04/21/2020   Lab Results  Component Value Date   NA 140 04/21/2020   K 3.7 04/21/2020   CL 104 04/21/2020   CO2 30 04/21/2020         Relevant Medications   losartan (COZAAR) 100 MG tablet   Cerebral atherosclerosis    Noted on previous head CT.  He is taking  a statin and tolerating it.       Relevant Medications   losartan (COZAAR) 100 MG tablet   Hyperlipidemia LDL goal <100    LDL is at goal on current  statin therapy which he started after reviewing atherosclerosis noted in previous CT   Lab Results  Component Value Date   CHOL 151 04/21/2020   HDL 39.90 04/21/2020   LDLCALC 77 04/21/2020   LDLDIRECT 63.0 05/05/2019   TRIG 172.0 (H) 04/21/2020   CHOLHDL 4 04/21/2020         Relevant Medications   losartan (COZAAR) 100 MG tablet   Type 2 diabetes mellitus with hyperglycemia (HCC)    Diagnosed after last visit in February 2021 with an A1c of  6.5.  Despite being lost to follow up and being unaware of the diagnosis ,  He has brought his a1c  down to 5.9  By repeat assessment.  No medications needed for glycemic control.  Continue statin and ARB .  Vaccinations are not up to date by patient choice.    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 Baker's losartan-hydrochlorothiazide and losartan. I have also changed his losartan. Additionally, I am having him maintain his Calcium Carbonate-Vitamin D, vitamin B-12, Vitamin A, fluticasone, sildenafil, gabapentin, atorvastatin, and hydrochlorothiazide.  Meds ordered this encounter  Medications  . DISCONTD: losartan (COZAAR) 50 MG tablet    Sig: Take 1 tablet (50 mg total) by mouth daily.    Dispense:  90 tablet    Refill:  2  . losartan (COZAAR) 100 MG tablet    Sig: Take 1 tablet (100 mg total) by mouth daily.    Dispense:  90 tablet    Refill:  1    REPLACES PREVIOUS RX FOR 50 MG SENT IN ON FEB 4    Medications Discontinued During This Encounter  Medication Reason  . losartan-hydrochlorothiazide (HYZAAR) 100-25 MG tablet Error  . losartan (COZAAR) 50 MG tablet   . losartan (COZAAR) 50 MG tablet     Follow-up: No follow-ups on file.   Sherlene Shams, MD

## 2020-04-21 NOTE — Patient Instructions (Addendum)
You need to have your eyes examined once a year because you have progressed from prediabetes to type 2 diabetes based on the A1c from last visit   I recommend that you consider having the pneumonia vaccine and the annual influenza vaccine as well   Diabetes Mellitus and Standards of Medical Care Living with and managing diabetes (diabetes mellitus) can be complicated. Your diabetes treatment may be managed by a team of health care providers, including:  A physician who specializes in diabetes (endocrinologist). You might also have visits with a nurse practitioner or physician assistant.  Nurses.  A registered dietitian.  A certified diabetes care and education specialist.  An exercise specialist.  A pharmacist.  An eye doctor.  A foot specialist (podiatrist).  A dental care provider.  A primary care provider.  A mental health care provider. How to manage your diabetes You can do many things to successfully manage your diabetes. Your health care providers will follow guidelines to help you get the best quality of care. Here are general guidelines for your diabetes management plan. Your health care providers may give you more specific instructions. Physical exams When you are diagnosed with diabetes, and each year after that, your health care provider will ask about your medical and family history. You will have a physical exam, which may include:  Measuring your height, weight, and body mass index (BMI).  Checking your blood pressure. This will be done at every routine medical visit. Your target blood pressure may vary depending on your medical conditions, your age, and other factors.  A thyroid exam.  A skin exam.  Screening for nerve damage (peripheral neuropathy). This may include checking the pulse in your legs and feet and the level of sensation in your hands and feet.  A foot exam to inspect the structure and skin of your feet, including checking for cuts, bruises,  redness, blisters, sores, or other problems.  Screening for blood vessel (vascular) problems. This may include checking the pulse in your legs and feet and checking your temperature. Blood tests Depending on your treatment plan and your personal needs, you may have the following tests:  Hemoglobin A1C (HbA1C). This test provides information about blood sugar (glucose) control over the previous 2-3 months. It is used to adjust your treatment plan, if needed. This test will be done: ? At least 2 times a year, if you are meeting your treatment goals. ? 4 times a year, if you are not meeting your treatment goals or if your goals have changed.  Lipid testing, including total cholesterol, LDL and HDL cholesterol, and triglyceride levels. ? The goal for LDL is less than 100 mg/dL (5.5 mmol/L). If you are at high risk for complications, the goal is less than 70 mg/dL (3.9 mmol/L). ? The goal for HDL is 40 mg/dL (2.2 mmol/L) or higher for men, and 50 mg/dL (2.8 mmol/L) or higher for women. An HDL cholesterol of 60 mg/dL (3.3 mmol/L) or higher gives some protection against heart disease. ? The goal for triglycerides is less than 150 mg/dL (8.3 mmol/L).  Liver function tests.  Kidney function tests.  Thyroid function tests.   Dental and eye exams  Visit your dentist two times a year.  If you have type 1 diabetes, your health care provider may recommend an eye exam within 5 years after you are diagnosed, and then once a year after your first exam. ? For children with type 1 diabetes, the health care provider may recommend an eye  exam when your child is age 57 or older and has had diabetes for 3-5 years. After the first exam, your child should get an eye exam once a year.  If you have type 2 diabetes, your health care provider may recommend an eye exam as soon as you are diagnosed, and then every 1-2 years after your first exam.   Immunizations  A yearly flu (influenza) vaccine is recommended  annually for everyone 6 months or older. This is especially important if you have diabetes.  The pneumonia (pneumococcal) vaccine is recommended for everyone 2 years or older who has diabetes. If you are age 54 or older, you may get the pneumonia vaccine as a series of two separate shots.  The hepatitis B vaccine is recommended for adults shortly after being diagnosed with diabetes. Adults and children with diabetes should receive all other vaccines according to age-specific recommendations from the Centers for Disease Control and Prevention (CDC). Mental and emotional health Screening for symptoms of eating disorders, anxiety, and depression is recommended at the time of diagnosis and after as needed. If your screening shows that you have symptoms, you may need more evaluation. You may work with a mental health care provider. Follow these instructions at home: Treatment plan You will monitor your blood glucose levels and may give yourself insulin. Your treatment plan will be reviewed at every medical visit. You and your health care provider will discuss:  How you are taking your medicines, including insulin.  Any side effects you have.  Your blood glucose level target goals.  How often you monitor your blood glucose level.  Lifestyle habits, such as activity level and tobacco, alcohol, and substance use. Education Your health care provider will assess how well you are monitoring your blood glucose levels and whether you are taking your insulin and medicines correctly. He or she may refer you to:  A certified diabetes care and education specialist to manage your diabetes throughout your life, starting at diagnosis.  A registered dietitian who can create and review your personal nutrition plan.  An exercise specialist who can discuss your activity level and exercise plan. General instructions  Take over-the-counter and prescription medicines only as told by your health care  provider.  Keep all follow-up visits. This is important. Where to find support There are many diabetes support networks, including:  American Diabetes Association (ADA): diabetes.org  Defeat Diabetes Foundation: defeatdiabetes.org Where to find more information  American Diabetes Association (ADA): www.diabetes.org  Association of Diabetes Care & Education Specialists (ADCES): diabeteseducator.org  International Diabetes Federation (IDF): http://hill.biz/ Summary  Managing diabetes (diabetes mellitus) can be complicated. Your diabetes treatment may be managed by a team of health care providers.  Your health care providers follow guidelines to help you get the best quality care.  You should have physical exams, blood tests, blood pressure monitoring, immunizations, and screening tests regularly. Stay updated on how to manage your diabetes.  Your health care providers may also give you more specific instructions based on your individual health. This information is not intended to replace advice given to you by your health care provider. Make sure you discuss any questions you have with your health care provider. Document Revised: 09/09/2019 Document Reviewed: 09/09/2019 Elsevier Patient Education  2021 [employer].

## 2020-04-23 MED ORDER — LOSARTAN POTASSIUM 100 MG PO TABS: 100.0000 mg | ORAL_TABLET | Freq: Every day | ORAL | 1 refills | Status: DC

## 2020-04-23 NOTE — Assessment & Plan Note (Signed)

## 2020-04-23 NOTE — Progress Notes (Signed)
Your diabetes remains under excellent control  on diet alone. And your cholesterol and other labs are also normal. Or unchanged.  Please continue your current medications. return in 6 months for follow up on diabetes and make sure you are seeing your eye doctor at least once a year for a dilated retina exam to monitor for diabetic retinopathy,. changes that can lead to blindness .   Regards,   Duncan Dull, MD

## 2020-04-23 NOTE — Assessment & Plan Note (Signed)
LDL is at goal on current  statin therapy which he started after reviewing atherosclerosis noted in previous CT   Lab Results  Component Value Date   CHOL 151 04/21/2020   HDL 39.90 04/21/2020   LDLCALC 77 04/21/2020   LDLDIRECT 63.0 05/05/2019   TRIG 172.0 (H) 04/21/2020   CHOLHDL 4 04/21/2020

## 2020-04-23 NOTE — Assessment & Plan Note (Addendum)
Diagnosed after last visit in February 2021 with an A1c of  6.5.  Despite being lost to follow up and being unaware of the diagnosis ,  He has brought his a1c  down to 5.9  By repeat assessment.  No medications needed for glycemic control.  Continue statin and ARB .  Vaccinations are not up to date by patient choice.    Lab Results  Component Value Date   HGBA1C 5.9 04/21/2020

## 2020-04-23 NOTE — Assessment & Plan Note (Signed)
Noted on previous head CT.  He is taking a statin and tolerating it.

## 2020-04-23 NOTE — Assessment & Plan Note (Addendum)
BP is Now at goal on 100 mg losartan and 25 mg daily . He prefers to keep the medications separate  Lab Results  Component Value Date   CREATININE 1.55 (H) 04/21/2020   Lab Results  Component Value Date   NA 140 04/21/2020   K 3.7 04/21/2020   CL 104 04/21/2020   CO2 30 04/21/2020

## 2020-06-13 ENCOUNTER — Other Ambulatory Visit: Payer: Self-pay | Admitting: Internal Medicine

## 2020-06-13 DIAGNOSIS — E785 Hyperlipidemia, unspecified: Secondary | ICD-10-CM

## 2020-09-05 ENCOUNTER — Ambulatory Visit: Payer: Medicare Other

## 2020-09-23 ENCOUNTER — Other Ambulatory Visit: Payer: Self-pay | Admitting: Internal Medicine

## 2020-10-06 ENCOUNTER — Ambulatory Visit: Payer: Medicare Other

## 2020-10-23 ENCOUNTER — Ambulatory Visit: Payer: Medicare Other | Admitting: Internal Medicine

## 2020-10-24 ENCOUNTER — Ambulatory Visit: Payer: Medicare Other | Admitting: Internal Medicine

## 2020-11-22 ENCOUNTER — Encounter: Payer: Self-pay | Admitting: Internal Medicine

## 2020-11-22 ENCOUNTER — Other Ambulatory Visit: Payer: Self-pay

## 2020-11-22 ENCOUNTER — Ambulatory Visit (INDEPENDENT_AMBULATORY_CARE_PROVIDER_SITE_OTHER): Payer: Medicare Other | Admitting: Internal Medicine

## 2020-11-22 VITALS — BP 132/80 | HR 75 | Temp 96.8°F | Ht 69.0 in | Wt 181.6 lb

## 2020-11-22 DIAGNOSIS — N181 Chronic kidney disease, stage 1: Secondary | ICD-10-CM

## 2020-11-22 DIAGNOSIS — S97102S Crushing injury of unspecified left toe(s), sequela: Secondary | ICD-10-CM

## 2020-11-22 DIAGNOSIS — G5790 Unspecified mononeuropathy of unspecified lower limb: Secondary | ICD-10-CM | POA: Diagnosis not present

## 2020-11-22 DIAGNOSIS — S9782XS Crushing injury of left foot, sequela: Secondary | ICD-10-CM | POA: Diagnosis not present

## 2020-11-22 DIAGNOSIS — Z0000 Encounter for general adult medical examination without abnormal findings: Secondary | ICD-10-CM

## 2020-11-22 DIAGNOSIS — E1165 Type 2 diabetes mellitus with hyperglycemia: Secondary | ICD-10-CM

## 2020-11-22 DIAGNOSIS — E785 Hyperlipidemia, unspecified: Secondary | ICD-10-CM

## 2020-11-22 DIAGNOSIS — N529 Male erectile dysfunction, unspecified: Secondary | ICD-10-CM

## 2020-11-22 DIAGNOSIS — I129 Hypertensive chronic kidney disease with stage 1 through stage 4 chronic kidney disease, or unspecified chronic kidney disease: Secondary | ICD-10-CM | POA: Diagnosis not present

## 2020-11-22 DIAGNOSIS — E663 Overweight: Secondary | ICD-10-CM

## 2020-11-22 NOTE — Progress Notes (Signed)
Subjective:  Patient ID: George Fowler, male    DOB: November 09, 1965  Age: 55 y.o. MRN: [id number]  CC: The primary encounter diagnosis was Hyperlipidemia LDL goal <100. 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  HPI George Fowler presents for  follow up on hypertension ,  type 2 DM and CKD   Chief Complaint  Patient presents with   Follow-up    6 month follow up    This visit occurred during the SARS-CoV-2 public health emergency.  Safety protocols were in place, including screening questions prior to the visit, additional usage of staff PPE, and extensive cleaning of exam room while observing appropriate contact time as indicated for disinfecting solutions.   Type 2 DM:  to date his diabetes has been diet controlled  by restricting intake to 1-2 meals daily, admittedly not low glycemic .  He eats what he wants but restricts himself to 2 meals daily.  Does not check sugars .  Denies neuropathy .  His job is physically active so he does not exercise .  He has lost weight intentionally: but does not  weigh at home .  He would like to get to 160 lbs/  He refuses vaccinations.   Hyperlipidemia:  Has been off of atorvastaitn for 3-4 days,  comin via mail order.   Outpatient Medications Prior to Visit  Medication Sig Dispense Refill   atorvastatin (LIPITOR) 20 MG tablet TAKE 1 TABLET BY MOUTH AT  BEDTIME 90 tablet 3   Calcium Carbonate-Vitamin D 600-400 MG-UNIT tablet Take 1 tablet by mouth daily.     fluticasone (FLONASE) 50 MCG/ACT nasal spray Place 2 sprays into both nostrils daily. 16 g 6    gabapentin (NEURONTIN) 300 MG capsule TAKE 3 CAPSULES BY MOUTH 3  TIMES DAILY 810 capsule 3   hydrochlorothiazide (HYDRODIURIL) 12.5 MG tablet TAKE 1 TABLET BY MOUTH  DAILY 90 tablet 3   losartan (COZAAR) 100 MG tablet TAKE 1 TABLET BY MOUTH  DAILY 90 tablet 3   sildenafil (REVATIO) 20 MG tablet Take 1 tablet (20 mg total) by mouth 3 (three) times daily. 270 tablet 3   Vitamin A 09233 units TABS Take 1 tablet by mouth daily.     vitamin B-12 (CYANOCOBALAMIN) 500 MCG tablet Take 500 mcg by mouth 2 (two) times daily.       No facility-administered medications prior to visit.    Review of Systems;  Patient denies headache, fevers, malaise, unintentional weight loss, skin rash, eye pain, sinus congestion and sinus pain, sore throat, dysphagia,  hemoptysis , cough, dyspnea, wheezing, chest pain, palpitations, orthopnea, edema, abdominal pain, nausea, melena, diarrhea, constipation, flank pain, dysuria, hematuria, urinary  Frequency, nocturia, numbness, tingling, seizures,  Focal weakness, Loss of consciousness,  Tremor, insomnia, depression, anxiety, and suicidal ideation.      Objective:  BP 132/80 (BP Location: Left Arm, Patient Position: Sitting, Cuff Size: Normal)   Pulse 75   Temp (!) 96.8 F (36 C) (Temporal)   Ht 5\' 9"  (1.753 m)   Wt 181 lb 9.6 oz (82.4 kg)   SpO2 95%   BMI 26.82 kg/m   BP Readings from Last 3 Encounters:  11/22/20 132/80  04/21/20 130/72  10/20/19 120/74    Wt Readings from Last 3 Encounters:  11/22/20 181 lb 9.6 oz (82.4 kg)  04/21/20 194 lb 3.2 oz (88.1 kg)  10/20/19 184 lb 12.8 oz (83.8 kg)    General appearance: alert, cooperative and appears stated age Ears: normal TM's and external ear canals both ears Throat: lips, mucosa, and tongue normal; teeth and gums normal Neck: no adenopathy, no carotid bruit, supple, symmetrical, trachea midline and thyroid not enlarged, symmetric, no tenderness/mass/nodules Back: symmetric, no curvature. ROM normal.  No CVA tenderness. Lungs: clear to auscultation bilaterally Heart: regular rate and rhythm, S1, S2 normal, no murmur, click, rub or gallop Abdomen: soft, non-tender; bowel sounds normal; no masses,  no organomegaly Pulses: 2+ and symmetric Skin: Skin color, texture, turgor normal. No rashes or lesions Lymph nodes: Cervical, supraclavicular, and axillary nodes normal.  Lab Results  Component Value Date   HGBA1C 5.9 04/21/2020   HGBA1C 6.5 05/05/2019   HGBA1C 5.5 02/06/2018    Lab Results  Component Value Date   CREATININE 1.55 (H) 04/21/2020   CREATININE 1.51 (H) 10/07/2019   CREATININE 1.47 05/18/2019    Lab Results  Component Value Date   WBC 9.6 10/30/2016   HGB 15.3 10/30/2016   HCT 46.1 10/30/2016   PLT 187.0 10/30/2016   GLUCOSE 66 (L) 04/21/2020   CHOL 151 04/21/2020   TRIG 172.0 (H) 04/21/2020   HDL 39.90 04/21/2020   LDLDIRECT 63.0 05/05/2019   LDLCALC 77 04/21/2020   ALT 37 04/21/2020   AST 28 04/21/2020   NA 140 04/21/2020   K 3.7 04/21/2020   CL 104 04/21/2020   CREATININE 1.55 (H) 04/21/2020   BUN 16 04/21/2020   CO2 30 04/21/2020   TSH 1.03 07/31/2017   PSA 1.31 04/21/2020   HGBA1C 5.9 04/21/2020   MICROALBUR <0.7 04/21/2020    DG Tibia/Fibula Right  Result Date: 09/19/2017 CLINICAL DATA:  Infection/cellulitis. EXAM: RIGHT TIBIA AND FIBULA - 2 VIEW COMPARISON:  No recent prior. FINDINGS: No acute bony or joint abnormality. No evidence of fracture or dislocation. Diffuse soft tissue swelling. IMPRESSION: Diffuse soft tissue swelling.  No acute bony or joint abnormality. 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  No formal diagnosis ,  Continue gabapentin.        Encounter for preventive  health examination   Benign hypertension with CKD (chronic kidney disease) stage I    BP is Now at goal on 100 mg losartan and 25 mg ctz  daily . He prefers to keep the medications separate. He has no proteinuria.  Will recommend GLP agonist pending repeat labs.   Lab Results  Component Value Date   CREATININE 1.55 (H) 04/21/2020   Lab Results  Component Value Date   NA 140 04/21/2020   K 3.7 04/21/2020   CL 104 04/21/2020   CO2 30 04/21/2020   Lab Results  Component Value Date   MICROALBUR <0.7 04/21/2020   MICROALBUR <0.7 05/18/2019           Hyperlipidemia LDL goal <100 - Primary   Relevant Orders   Lipid panel   TSH   Crushing injury of  foot with toe, left, sequela    He has a peripheral neuropathy from a remote crush injury      Overweight (BMI 25.0-29.9)    I have congratulated him in reduction of   BMI and encouraged  Continued weight loss with goal of 10% of body weigh over the next 6 months using a low glycemic index diet and regular exercise a minimum of 5 days per week.        Type 2 diabetes mellitus with hyperglycemia (HCC)    DiagnosedFebruary 2021 with an A1c of  6.5.   He has brought his a1c  down to 5.9  By repeat assessment.  No medications needed for glycemic control.  Continue statin and ARB .  Vaccinations are not up to date by patient choice.    Lab Results  Component Value Date   HGBA1C 5.9 04/21/2020         Relevant Orders   Comprehensive metabolic panel   Hemoglobin A1c   Erectile dysfunction    Managed with generic Viagra, refill rx given       I spent 30 minutes dedicated to the care of this patient on the date of this encounter to include pre-visit review of his medical history,  Face-to-face time with the patient , and post visit ordering of testing and therapeutics.  Follow-up: No follow-ups on file.   Sherlene Shams, MD

## 2020-11-25 NOTE — Assessment & Plan Note (Signed)
He has a peripheral neuropathy from a remote crush injury

## 2020-11-25 NOTE — Assessment & Plan Note (Signed)
Attributed to crush inujry of left foot in 2003 and continued favoring of right foot.  No formal diagnosis ,  Continue gabapentin.   

## 2020-11-25 NOTE — Assessment & Plan Note (Signed)
Managed with generic Viagra, refill rx given

## 2020-11-25 NOTE — Assessment & Plan Note (Signed)
DiagnosedFebruary 2021 with an A1c of  6.5.   He has brought his a1c  down to 5.9  By repeat assessment.  No medications needed for glycemic control.  Continue statin and ARB .  Vaccinations are not up to date by patient choice.    Lab Results  Component Value Date   HGBA1C 5.9 04/21/2020

## 2020-11-25 NOTE — Assessment & Plan Note (Signed)
I have congratulated him in reduction of   BMI and encouraged  Continued weight loss with goal of 10% of body weigh over the next 6 months using a low glycemic index diet and regular exercise a minimum of 5 days per week.   

## 2020-11-25 NOTE — Assessment & Plan Note (Addendum)
BP is Now at goal on 100 mg losartan and 25 mg ctz  daily . He prefers to keep the medications separate. He has no proteinuria.  Will recommend GLP agonist pending repeat labs.   Lab Results  Component Value Date   CREATININE 1.55 (H) 04/21/2020   Lab Results  Component Value Date   NA 140 04/21/2020   K 3.7 04/21/2020   CL 104 04/21/2020   CO2 30 04/21/2020   Lab Results  Component Value Date   MICROALBUR <0.7 04/21/2020   MICROALBUR <0.7 05/18/2019

## 2020-11-27 ENCOUNTER — Telehealth: Payer: Self-pay | Admitting: Internal Medicine

## 2020-11-27 MED ORDER — MOMETASONE FUROATE 0.1 % EX CREA: TOPICAL_CREAM | CUTANEOUS | 1 refills | Status: DC

## 2020-11-27 NOTE — Telephone Encounter (Signed)
Thanks.  Elocon cream refilled.

## 2020-11-27 NOTE — Telephone Encounter (Signed)
Patient states that he forgot to ask at his 11/22/20 appointment, he has Eczema on his left hand that is bothering him.  Wanting to know if anything could be prescribed? States over the counter hand lotion is not helping the itchiness.

## 2020-11-27 NOTE — Telephone Encounter (Signed)
Looks like there is a diagnosis of eczema in the chart from 2015 diagnosed by Dr. Milinda Antis at [facility].

## 2020-11-28 NOTE — Telephone Encounter (Signed)
Spoke with pt to let him know that the elocon cream has been sent in to [facility]. Pt gave a verbal understanding.

## 2020-11-29 ENCOUNTER — Other Ambulatory Visit: Payer: Self-pay

## 2020-11-29 ENCOUNTER — Other Ambulatory Visit (INDEPENDENT_AMBULATORY_CARE_PROVIDER_SITE_OTHER): Payer: Medicare Other

## 2020-11-29 ENCOUNTER — Ambulatory Visit: Payer: Medicare Other

## 2020-11-29 DIAGNOSIS — E785 Hyperlipidemia, unspecified: Secondary | ICD-10-CM

## 2020-11-29 DIAGNOSIS — I129 Hypertensive chronic kidney disease with stage 1 through stage 4 chronic kidney disease, or unspecified chronic kidney disease: Secondary | ICD-10-CM

## 2020-11-29 DIAGNOSIS — N181 Chronic kidney disease, stage 1: Secondary | ICD-10-CM

## 2020-11-29 DIAGNOSIS — E1165 Type 2 diabetes mellitus with hyperglycemia: Secondary | ICD-10-CM

## 2020-11-29 LAB — COMPREHENSIVE METABOLIC PANEL
ALT: 17 U/L (ref 0–53)
AST: 15 U/L (ref 0–37)
Albumin: 4.1 g/dL (ref 3.5–5.2)
Alkaline Phosphatase: 88 U/L (ref 39–117)
BUN: 15 mg/dL (ref 6–23)
CO2: 29 mEq/L (ref 19–32)
Calcium: 9.4 mg/dL (ref 8.4–10.5)
Chloride: 103 mEq/L (ref 96–112)
Creatinine, Ser: 1.6 mg/dL — ABNORMAL HIGH (ref 0.40–1.50)
GFR: 48.81 mL/min — ABNORMAL LOW (ref >60.00)
Glucose, Bld: 98 mg/dL (ref 70–99)
Potassium: 4 mEq/L (ref 3.5–5.1)
Sodium: 139 mEq/L (ref 135–145)
Total Bilirubin: 0.7 mg/dL (ref 0.2–1.2)
Total Protein: 6.3 g/dL (ref 6.0–8.3)

## 2020-11-29 LAB — HEMOGLOBIN A1C: Hgb A1c MFr Bld: 5.8 % (ref 4.6–6.5)

## 2020-11-29 LAB — LIPID PANEL
Cholesterol: 166 mg/dL (ref 0–200)
HDL: 36.9 mg/dL — ABNORMAL LOW (ref >39.00)
LDL Cholesterol: 93 mg/dL (ref 0–99)
NonHDL: 129.43
Total CHOL/HDL Ratio: 5
Triglycerides: 184 mg/dL — ABNORMAL HIGH (ref 0.0–149.0)
VLDL: 36.8 mg/dL (ref 0.0–40.0)

## 2020-11-29 LAB — TSH: TSH: 1.37 u[IU]/mL (ref 0.35–5.50)

## 2020-12-02 ENCOUNTER — Encounter: Payer: Self-pay | Admitting: Internal Medicine

## 2020-12-05 NOTE — Assessment & Plan Note (Signed)
He has declined nephrology referral

## 2020-12-05 NOTE — Progress Notes (Signed)
Nephrology referral declined

## 2021-04-04 ENCOUNTER — Telehealth: Payer: Self-pay | Admitting: Internal Medicine

## 2021-04-04 ENCOUNTER — Other Ambulatory Visit: Payer: Self-pay

## 2021-04-04 MED ORDER — SILDENAFIL CITRATE 20 MG PO TABS: 20.0000 mg | ORAL_TABLET | Freq: Three times a day (TID) | ORAL | 3 refills | Status: DC

## 2021-04-04 NOTE — Telephone Encounter (Signed)
[facility] called in regards to pt. Pt is using this facility now and is needing a refill for   Pharmacy is requesting a 90 day supply for pt.   Phone: [phone] [facility] Fax: [phone]

## 2021-04-05 MED ORDER — SILDENAFIL CITRATE 20 MG PO TABS: 20.0000 mg | ORAL_TABLET | Freq: Three times a day (TID) | ORAL | 3 refills | Status: DC

## 2021-04-05 NOTE — Telephone Encounter (Signed)
Rx has been sent in as a 90 day supply.

## 2021-04-05 NOTE — Addendum Note (Signed)
Addended by: Sandy Salaam on: 04/05/2021 03:25 PM   Modules accepted: Orders

## 2021-04-05 NOTE — Telephone Encounter (Signed)
Is it okay to refill Sildenafil for a 90 day supply?

## 2021-04-11 ENCOUNTER — Other Ambulatory Visit: Payer: Self-pay | Admitting: Internal Medicine

## 2021-04-23 ENCOUNTER — Encounter: Payer: Self-pay | Admitting: Internal Medicine

## 2021-04-23 ENCOUNTER — Other Ambulatory Visit: Payer: Self-pay

## 2021-04-23 ENCOUNTER — Ambulatory Visit (INDEPENDENT_AMBULATORY_CARE_PROVIDER_SITE_OTHER): Payer: Medicare Other | Admitting: Internal Medicine

## 2021-04-23 VITALS — BP 120/68 | HR 92 | Temp 98.1°F | Ht 69.0 in | Wt 184.6 lb

## 2021-04-23 DIAGNOSIS — Z125 Encounter for screening for malignant neoplasm of prostate: Secondary | ICD-10-CM

## 2021-04-23 DIAGNOSIS — I129 Hypertensive chronic kidney disease with stage 1 through stage 4 chronic kidney disease, or unspecified chronic kidney disease: Secondary | ICD-10-CM | POA: Diagnosis not present

## 2021-04-23 DIAGNOSIS — Z0000 Encounter for general adult medical examination without abnormal findings: Secondary | ICD-10-CM

## 2021-04-23 DIAGNOSIS — R5383 Other fatigue: Secondary | ICD-10-CM

## 2021-04-23 DIAGNOSIS — Z113 Encounter for screening for infections with a predominantly sexual mode of transmission: Secondary | ICD-10-CM

## 2021-04-23 DIAGNOSIS — E785 Hyperlipidemia, unspecified: Secondary | ICD-10-CM | POA: Diagnosis not present

## 2021-04-23 DIAGNOSIS — E1122 Type 2 diabetes mellitus with diabetic chronic kidney disease: Secondary | ICD-10-CM

## 2021-04-23 DIAGNOSIS — N181 Chronic kidney disease, stage 1: Secondary | ICD-10-CM | POA: Diagnosis not present

## 2021-04-23 DIAGNOSIS — N183 Chronic kidney disease, stage 3 unspecified: Secondary | ICD-10-CM | POA: Diagnosis not present

## 2021-04-23 DIAGNOSIS — E663 Overweight: Secondary | ICD-10-CM

## 2021-04-23 LAB — COMPREHENSIVE METABOLIC PANEL
ALT: 20 U/L (ref 0–53)
AST: 13 U/L (ref 0–37)
Albumin: 4.1 g/dL (ref 3.5–5.2)
Alkaline Phosphatase: 97 U/L (ref 39–117)
BUN: 15 mg/dL (ref 6–23)
CO2: 31 mEq/L (ref 19–32)
Calcium: 8.9 mg/dL (ref 8.4–10.5)
Chloride: 106 mEq/L (ref 96–112)
Creatinine, Ser: 1.57 mg/dL — ABNORMAL HIGH (ref 0.40–1.50)
GFR: 49.8 mL/min — ABNORMAL LOW (ref >60.00)
Glucose, Bld: 125 mg/dL — ABNORMAL HIGH (ref 70–99)
Potassium: 3.9 mEq/L (ref 3.5–5.1)
Sodium: 143 mEq/L (ref 135–145)
Total Bilirubin: 0.6 mg/dL (ref 0.2–1.2)
Total Protein: 6.1 g/dL (ref 6.0–8.3)

## 2021-04-23 LAB — LIPID PANEL
Cholesterol: 149 mg/dL (ref 0–200)
HDL: 39.9 mg/dL (ref >39.00)
LDL Cholesterol: 86 mg/dL (ref 0–99)
NonHDL: 109.53
Total CHOL/HDL Ratio: 4
Triglycerides: 116 mg/dL (ref 0.0–149.0)
VLDL: 23.2 mg/dL (ref 0.0–40.0)

## 2021-04-23 LAB — TSH: TSH: 0.92 u[IU]/mL (ref 0.35–5.50)

## 2021-04-23 LAB — HEMOGLOBIN A1C: Hgb A1c MFr Bld: 5.9 % (ref 4.6–6.5)

## 2021-04-23 NOTE — Assessment & Plan Note (Addendum)
BP is Now at goal on 100 mg losartan and 25 mg hctz  daily . He prefers to keep the medications separate. He has no history of proteinuria. He defers nephrology referral  Lab Results  Component Value Date   NA 139 11/29/2020   K 4.0 11/29/2020   CL 103 11/29/2020   CO2 29 11/29/2020   Lab Results  Component Value Date   MICROALBUR <0.7 04/21/2020   MICROALBUR <0.7 05/18/2019

## 2021-04-23 NOTE — Assessment & Plan Note (Addendum)
Wants to lose weight ,  Goal 170 .  Weighed 120 in high school  , and gained weight after his foot injury due to drinking too much.  Eating 2 meals daily

## 2021-04-23 NOTE — Progress Notes (Signed)
Patient ID: George Fowler, male    DOB: June 28, 1965  Age: 56 y.o. MRN: [id number]  The patient is here for annual preventive  examination and management of other chronic and acute problems.  This visit occurred during the SARS-CoV-2 public health emergency.  Safety protocols were in place, including screening questions prior to the visit, additional usage of staff PPE, and extensive cleaning of exam room while observing appropriate contact time as indicated for disinfecting solutions.     The risk factors are reflected in the social history.  The roster of all physicians providing medical care to patient - is listed in the Snapshot section of the chart.  Activities of daily living:  The patient is 100% independent in all ADLs: dressing, toileting, feeding as well as independent mobility  Home safety : The patient has smoke detectors in the home. They wear seatbelts.  There are no firearms at home. There is no violence in the home.   There is no risks for hepatitis, STDs or HIV. There is no   history of blood transfusion. They have no travel history to infectious disease endemic areas of the world.  The patient has seen their dentist in the last six month. They have seen their eye doctor in the last year. They admit to slight hearing difficulty with regard to whispered voices and some television programs.  They have deferred audiologic testing in the last year.  They do not  have excessive sun exposure. Discussed the need for sun protection: hats, long sleeves and use of sunscreen if there is significant sun exposure.   Diet: the importance of a healthy diet is discussed. They do have a healthy diet.  The benefits of regular aerobic exercise were discussed.  He did not exercise for the past six months but has returned to the gym 2 days per week  and plans to increase his frequency more when his work schedule  permits .   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  Cognitive assessment: the patient manages all their financial and personal affairs and is actively engaged. They could relate day,date,year and events; recalled 2/3 objects at 3 minutes; performed clock-face test normally.  The following portions of the patient's history were reviewed and updated as appropriate: allergies, current medications, past family history, past medical history,  past surgical history, past social history  and problem list.  Visual acuity was not assessed per patient preference since she has regular follow up with her ophthalmologist. Hearing and body mass index were assessed and reviewed.   During the course of the visit the patient was educated and counseled about appropriate screening and preventive services including : fall prevention , diabetes screening, nutrition counseling, colorectal cancer screening, and recommended immunizations.    CC: The primary encounter diagnosis was Type 2 DM with CKD stage 3 and hypertension ([place]). Diagnoses of Hyperlipidemia LDL goal <100, Prostate cancer screening, Other fatigue, Encounter for preventive health examination, Screen for STD (sexually transmitted disease), Overweight (BMI 25.0-29.9), and Benign hypertension with CKD (chronic kidney disease) stage I were also pertinent to this visit.  1) Diet controlled DM,   CKD (refuses nephrology referral) hypertension and hyperlipidemia   2) Declines colon cancer screening and prostate cancer screening ,  HIV and Hep C     History Kekai has a past medical history of  Foot injury, Neuropathy of leg, No pertinent past medical history, Seasonal allergic rhinitis, and Umbilical hernia (8/58/8502).   He has a past surgical history that includes Foot surgery (2003-lt); Foot arthroplasty (2003); Eye surgery (7741); and Umbilical hernia repair  (09/25/2011).   His family history includes Cancer in an other family member; Diabetes in his mother; Heart disease in his paternal grandfather and paternal grandmother; Hypertension in his father and mother.He reports that he quit smoking about 31 years ago. His smoking use included cigarettes. He has never used smokeless tobacco. He reports current alcohol use of about 1.0 - 2.0 standard drink per week. He reports that he does not use drugs.  Outpatient Medications Prior to Visit  Medication Sig Dispense Refill   atorvastatin (LIPITOR) 20 MG tablet TAKE 1 TABLET BY MOUTH AT  BEDTIME 90 tablet 3   Calcium Carbonate-Vitamin D 600-400 MG-UNIT tablet Take 1 tablet by mouth daily.     fluticasone (FLONASE) 50 MCG/ACT nasal spray Place 2 sprays into both nostrils daily. 16 g 6   gabapentin (NEURONTIN) 300 MG capsule TAKE 3 CAPSULES BY MOUTH 3  TIMES DAILY 810 capsule 3   hydrochlorothiazide (HYDRODIURIL) 12.5 MG tablet TAKE 1 TABLET BY MOUTH  DAILY 90 tablet 3   losartan (COZAAR) 100 MG tablet TAKE 1 TABLET BY MOUTH  DAILY 90 tablet 3   mometasone (ELOCON) 0.1 % cream Use twice daily as needed for eczema 15 g 1   sildenafil (REVATIO) 20 MG tablet Take 1 tablet (20 mg total) by mouth 3 (three) times daily. 270 tablet 3   Vitamin A 10000 units TABS Take 1 tablet by mouth daily.     vitamin B-12 (CYANOCOBALAMIN) 500 MCG tablet Take 500 mcg by mouth 2 (two) times daily.       No facility-administered medications prior to visit.    Review of Systems  Patient denies headache, fevers, malaise, unintentional weight loss, skin rash, eye pain, sinus congestion and sinus pain, sore throat, dysphagia,  hemoptysis , cough, dyspnea, wheezing, chest pain, palpitations, orthopnea, edema, abdominal pain, nausea, melena, diarrhea, constipation, flank pain, dysuria, hematuria, urinary  Frequency, nocturia, numbness, tingling, seizures,  Focal weakness, Loss of consciousness,  Tremor, insomnia, depression, anxiety, and  suicidal ideation.     Objective:  BP 120/68 (BP Location: Left Arm, Patient Position: Sitting, Cuff Size: Normal)    Pulse 92    Temp 98.1 F (36.7 C) (Oral)    Ht 5' 9" (1.753 m)    Wt 184 lb 9.6 oz (83.7 kg)    SpO2 96%    BMI 27.26 kg/m   Physical Exam  General appearance: alert, cooperative and appears stated age Ears: normal TM's and external ear canals both ears Throat: lips, mucosa, and tongue normal; teeth and gums normal Neck: no adenopathy, no carotid bruit, supple, symmetrical, trachea midline and thyroid not enlarged, symmetric, no tenderness/mass/nodules Back: symmetric, no curvature. ROM normal. No CVA tenderness. Lungs: clear to auscultation bilaterally Heart: regular rate and rhythm, S1, S2 normal, no murmur, click, rub or gallop Abdomen: soft, non-tender; bowel sounds normal; no masses,  no organomegaly Pulses: 2+ and symmetric Skin: Skin color, texture, turgor normal. No rashes or lesions Lymph nodes: Cervical, supraclavicular, and axillary nodes normal.   Assessment & Plan:   Problem List Items Addressed This Visit     Benign hypertension with CKD (chronic kidney disease) stage I    BP is Now at goal on 100 mg losartan and 25 mg hctz  daily .  He prefers to keep the medications separate. He has no history of proteinuria. He defers nephrology referral  Lab Results  Component Value Date   NA 139 11/29/2020   K 4.0 11/29/2020   CL 103 11/29/2020   CO2 29 11/29/2020   Lab Results  Component Value Date   MICROALBUR <0.7 04/21/2020   MICROALBUR <0.7 05/18/2019          Encounter for preventive health examination    age appropriate education and counseling updated, referrals for preventative services and immunizations addressed, dietary and smoking counseling addressed, most recent labs reviewed.  I have personally reviewed and have noted:   1) the patient's medical and social history 2) The pt's use of alcohol, tobacco, and illicit drugs 3) The patient's  current medications and supplements 4) Functional ability including ADL's, fall risk, home safety risk, hearing and visual impairment 5) Diet and physical activities 6) Evidence for depression or mood disorder 7) The patient's height, weight, and BMI have been recorded in the chart 8) I have ordered and reviewed a 12 lead EKG and find that there are no acute changes and patient is in sinus rhythm.     I have made referrals, and provided counseling and education based on review of the above      Hyperlipidemia LDL goal <100    Managed with atorvastatin 20 mg daily       Relevant Orders   Lipid Profile   Overweight (BMI 25.0-29.9)    Wants to lose weight ,  Goal 170 .  Weighed 120 in high school  , and gained weight after his foot injury due to drinking too much.  Eating 2 meals daily       Type 2 DM with CKD stage 3 and hypertension ([place]) - Primary    Diagnosed in February 2021 with an A1c of  6.5.   He has brought his a1c  down to 5.9  By repeat assessment.  No medications needed for glycemic control until A1c is 6.5 or higher .  Continue statin and ARB .  Vaccinations are not up to date by patient choice.    Lab Results  Component Value Date   HGBA1C 5.8 11/29/2020         Relevant Orders   Comp Met (CMET)   HgB A1c   Urine Microalbumin w/creat. ratio   Other Visit Diagnoses     Prostate cancer screening       Other fatigue       Relevant Orders   TSH   Screen for STD (sexually transmitted disease)           I am having Uno Esau. Valere Dross maintain his Calcium Carbonate-Vitamin D, vitamin B-12, Vitamin A, fluticasone, atorvastatin, gabapentin, losartan, mometasone, sildenafil, and hydrochlorothiazide.  No orders of the defined types were placed in this encounter.   There are no discontinued medications.  Follow-up: Return in about 6 months (around 10/21/2021) for follow up diabetes.   Crecencio Mc, MD

## 2021-04-23 NOTE — Assessment & Plan Note (Signed)
Diagnosed in February 2021 with an A1c of  6.5.   He has brought his a1c  down to 5.9  By repeat assessment.  No medications needed for glycemic control until A1c is 6.5 or higher .  Continue statin and ARB .  Vaccinations are not up to date by patient choice.    Lab Results  Component Value Date   HGBA1C 5.8 11/29/2020

## 2021-04-23 NOTE — Assessment & Plan Note (Signed)

## 2021-04-23 NOTE — Assessment & Plan Note (Signed)
Managed with atorvastatin 20 mg daily

## 2021-04-23 NOTE — Patient Instructions (Signed)
You have declined screening for prostate cancer and colon cancer  If you change your mind,  please let me know and I will make the referrals.  Don't forget that an diabetic annual exam can prevent blindness  that occurs over time from diabetes

## 2021-04-24 ENCOUNTER — Telehealth: Payer: Self-pay | Admitting: Internal Medicine

## 2021-04-24 NOTE — Telephone Encounter (Signed)
Spoke with pt and he stated that he was unable to supply a urine sample yesterday during his visit and was unaware that he could get a cup and bring it home to collect then bring back.

## 2021-04-24 NOTE — Telephone Encounter (Signed)
Pt called stating he want to talk to the cma today regarding his lab appointment tomorrow.

## 2021-04-25 ENCOUNTER — Other Ambulatory Visit: Payer: Medicare Other

## 2021-05-04 NOTE — Telephone Encounter (Signed)
Urine cup has been given to pt.

## 2021-05-09 LAB — MICROALBUMIN / CREATININE URINE RATIO
Creatinine,U: 29.9 mg/dL
Microalb Creat Ratio: 2.3 mg/g (ref 0.0–30.0)
Microalb, Ur: <0.7 mg/dL (ref 0.0–1.9)

## 2021-05-16 ENCOUNTER — Other Ambulatory Visit: Payer: Self-pay | Admitting: Internal Medicine

## 2021-05-16 DIAGNOSIS — E785 Hyperlipidemia, unspecified: Secondary | ICD-10-CM

## 2021-08-21 ENCOUNTER — Ambulatory Visit (INDEPENDENT_AMBULATORY_CARE_PROVIDER_SITE_OTHER): Payer: Medicare Other | Admitting: Internal Medicine

## 2021-08-21 ENCOUNTER — Encounter: Payer: Self-pay | Admitting: Internal Medicine

## 2021-08-21 ENCOUNTER — Ambulatory Visit (INDEPENDENT_AMBULATORY_CARE_PROVIDER_SITE_OTHER): Payer: Medicare Other

## 2021-08-21 VITALS — BP 96/70 | HR 95 | Temp 98.4°F | Ht 69.0 in | Wt 181.8 lb

## 2021-08-21 DIAGNOSIS — J0100 Acute maxillary sinusitis, unspecified: Secondary | ICD-10-CM | POA: Insufficient documentation

## 2021-08-21 DIAGNOSIS — R058 Other specified cough: Secondary | ICD-10-CM

## 2021-08-21 DIAGNOSIS — R059 Cough, unspecified: Secondary | ICD-10-CM | POA: Diagnosis not present

## 2021-08-21 MED ORDER — PREDNISONE 10 MG PO TABS: ORAL_TABLET | ORAL | 0 refills | Status: DC

## 2021-08-21 MED ORDER — LEVOFLOXACIN 500 MG PO TABS: 500.0000 mg | ORAL_TABLET | Freq: Every day | ORAL | 0 refills | Status: AC

## 2021-08-21 NOTE — Assessment & Plan Note (Signed)
Empiric levaquin and prednisone taper.  Chest x ray needed given reports of purulent sputum to rule out CAP

## 2021-08-21 NOTE — Progress Notes (Signed)
Subjective:  Patient ID: George Fowler, male    DOB: 26-Sep-1965  Age: 56 y.o. MRN: [id number]  CC: The primary encounter diagnosis was Productive cough. A diagnosis of Acute non-recurrent maxillary sinusitis was also pertinent to this visit.   HPI George Fowler presents for  Chief Complaint  Patient presents with   Acute Visit    Sinus pain, pressure, teeth hurt, head congestion and chest congestion. Pt stated that the chest congestion is green. Symptoms started on Sunday.    ,56 yr old male with history of tobacco abuse (former smoker) , allergic rhinitis CAD, HTN and CKD presents with sigsn and symptoms of sinusitis.   Sinus congestion and cough started on Saturday , became much worse on  Sunday.    No precedent allergy symptoms.  Reports a  temp of 100.5  , which was last seen on Monday morning .  Taking tylenol and Allegra.    Has developed maxillary pain on the right with  aching of ipsilateral teeth ,  cough  has become productive of green sputum  .   home COVID TEST is negative.  Symptoms have improved slightly over the last 24 hours. Teeth no longer hurting  as bad as yesterday. Taking Delsym X, for cough ,  a once daily antihistamine ,     Outpatient Medications Prior to Visit  Medication Sig Dispense Refill   atorvastatin (LIPITOR) 20 MG tablet TAKE 1 TABLET BY MOUTH AT  BEDTIME 90 tablet 3   Calcium Carbonate-Vitamin D 600-400 MG-UNIT tablet Take 1 tablet by mouth daily.     fluticasone (FLONASE) 50 MCG/ACT nasal spray Place 2 sprays into both nostrils daily. 16 g 6   gabapentin (NEURONTIN) 300 MG capsule TAKE 3 CAPSULES BY MOUTH 3  TIMES DAILY 810 capsule 3   hydrochlorothiazide (HYDRODIURIL) 12.5 MG tablet TAKE 1 TABLET BY MOUTH  DAILY 90 tablet 3   losartan (COZAAR) 100 MG tablet TAKE 1 TABLET BY MOUTH  DAILY 90 tablet 3   mometasone (ELOCON) 0.1 % cream Use twice daily as needed for eczema 15 g 1   sildenafil (REVATIO) 20 MG tablet Take 1 tablet (20 mg total) by mouth  3 (three) times daily. 270 tablet 3   Vitamin A 50539 units TABS Take 1 tablet by mouth daily.     vitamin B-12 (CYANOCOBALAMIN) 500 MCG tablet Take 500 mcg by mouth 2 (two) times daily.       No facility-administered medications prior to visit.    Review of Systems;  Patient denies headache, fevers, malaise, unintentional weight loss, skin rash, eye pain, sinus congestion and sinus pain, sore throat, dysphagia,  hemoptysis , cough, dyspnea, wheezing, chest pain, palpitations, orthopnea, edema, abdominal pain, nausea, melena, diarrhea, constipation, flank pain, dysuria, hematuria, urinary  Frequency, nocturia, numbness, tingling, seizures,  Focal weakness, Loss of consciousness,  Tremor, insomnia, depression, anxiety, and suicidal ideation.      Objective:  BP 96/70 (BP Location: Left Arm, Patient Position: Sitting, Cuff Size: Normal)   Pulse 95   Temp 98.4 F (36.9 C) (Oral)   Ht 5\' 9"  (1.753 m)   Wt 181 lb 12.8 oz (82.5 kg)   SpO2 93%   BMI 26.85 kg/m   BP Readings from Last 3 Encounters:  08/21/21 96/70  04/23/21 120/68  11/22/20 132/80    Wt Readings from Last 3 Encounters:  08/21/21 181 lb 12.8 oz (82.5 kg)  04/23/21 184 lb 9.6 oz (83.7 kg)  11/22/20 181 lb 9.6  oz (82.4 kg)    General appearance: alert, cooperative and appears stated age Ears: normal TM's and external ear canals both ears Face :  tender over right maxillary sinus Throat: lips, mucosa, and tongue normal; teeth and gums normal Neck: no adenopathy, no carotid bruit, supple, symmetrical, trachea midline and thyroid not enlarged, symmetric, no tenderness/mass/nodules Back: symmetric, no curvature. ROM normal. No CVA tenderness. Lungs: clear to auscultation bilaterally, no egophony  Heart: regular rate and rhythm, S1, S2 normal, no murmur, click, rub or gallop Abdomen: soft, non-tender; bowel sounds normal; no masses,  no organomegaly Pulses: 2+ and symmetric Skin: Skin color, texture, turgor normal. No  rashes or lesions Lymph nodes: Cervical, supraclavicular, and axillary nodes normal.  Lab Results  Component Value Date   HGBA1C 5.9 04/23/2021   HGBA1C 5.8 11/29/2020   HGBA1C 5.9 04/21/2020    Lab Results  Component Value Date   CREATININE 1.57 (H) 04/23/2021   CREATININE 1.60 (H) 11/29/2020   CREATININE 1.55 (H) 04/21/2020    Lab Results  Component Value Date   WBC 9.6 10/30/2016   HGB 15.3 10/30/2016   HCT 46.1 10/30/2016   PLT 187.0 10/30/2016   GLUCOSE 125 (H) 04/23/2021   CHOL 149 04/23/2021   TRIG 116.0 04/23/2021   HDL 39.90 04/23/2021   LDLDIRECT 63.0 05/05/2019   LDLCALC 86 04/23/2021   ALT 20 04/23/2021   AST 13 04/23/2021   NA 143 04/23/2021   K 3.9 04/23/2021   CL 106 04/23/2021   CREATININE 1.57 (H) 04/23/2021   BUN 15 04/23/2021   CO2 31 04/23/2021   TSH 0.92 04/23/2021   PSA 1.31 04/21/2020   HGBA1C 5.9 04/23/2021   MICROALBUR <0.7 05/09/2021    DG Tibia/Fibula Right  Result Date: 09/19/2017 CLINICAL DATA:  Infection/cellulitis. EXAM: RIGHT TIBIA AND FIBULA - 2 VIEW COMPARISON:  No recent prior. FINDINGS: No acute bony or joint abnormality. No evidence of fracture or dislocation. Diffuse soft tissue swelling. IMPRESSION: Diffuse soft tissue swelling.  No acute bony or joint abnormality. Electronically Signed   By: Maisie Fus  Register   On: 09/19/2017 13:21    Assessment & Plan:   Problem List Items Addressed This Visit     Maxillary sinusitis, acute    Empiric levaquin and prednisone taper.  Chest x ray needed given reports of purulent sputum to rule out CAP        Relevant Medications   levofloxacin (LEVAQUIN) 500 MG tablet   predniSONE (DELTASONE) 10 MG tablet   Other Visit Diagnoses     Productive cough    -  Primary   Relevant Orders   DG Chest 2 View       I spent a total of   minutes with this patient in a face to face visit on the date of this encounter reviewing the last office visit with me on        ,  most recent with  patient's cardiologist in    ,  patient'ss diet and eating habits, home blood pressure readings ,  most recent imaging study ,   and post visit ordering of testing and therapeutics.    Follow-up: No follow-ups on file.   Sherlene Shams, MD

## 2021-08-21 NOTE — Patient Instructions (Addendum)
I am treating you for sinusitis     am prescribing an antibiotic (levaquin) and a prednisone taper     (you take 6 prednisone  tablets all at once on Day 1,  Then taper by 1 tablet daily until gone    If Your chest x ray suggests pneumonia, I will prescribe a second antibiotic to take along with the levaquin  You may continue to use the OTC meds to help with your other symptoms.

## 2021-09-10 ENCOUNTER — Ambulatory Visit (INDEPENDENT_AMBULATORY_CARE_PROVIDER_SITE_OTHER): Payer: Medicare Other | Admitting: Internal Medicine

## 2021-09-10 ENCOUNTER — Encounter: Payer: Self-pay | Admitting: Internal Medicine

## 2021-09-10 DIAGNOSIS — N529 Male erectile dysfunction, unspecified: Secondary | ICD-10-CM | POA: Diagnosis not present

## 2021-09-10 DIAGNOSIS — Z658 Other specified problems related to psychosocial circumstances: Secondary | ICD-10-CM | POA: Diagnosis not present

## 2021-09-11 DIAGNOSIS — R4589 Other symptoms and signs involving emotional state: Secondary | ICD-10-CM | POA: Insufficient documentation

## 2021-09-11 DIAGNOSIS — Z658 Other specified problems related to psychosocial circumstances: Secondary | ICD-10-CM | POA: Insufficient documentation

## 2021-10-22 ENCOUNTER — Ambulatory Visit: Payer: Medicare Other | Admitting: Internal Medicine

## 2021-10-31 ENCOUNTER — Encounter: Payer: Self-pay | Admitting: Internal Medicine

## 2021-10-31 ENCOUNTER — Ambulatory Visit (INDEPENDENT_AMBULATORY_CARE_PROVIDER_SITE_OTHER): Payer: Medicare Other | Admitting: Internal Medicine

## 2021-10-31 VITALS — BP 104/66 | HR 95 | Temp 97.6°F | Ht 69.0 in | Wt 187.4 lb

## 2021-10-31 DIAGNOSIS — G5790 Unspecified mononeuropathy of unspecified lower limb: Secondary | ICD-10-CM | POA: Diagnosis not present

## 2021-10-31 DIAGNOSIS — Z113 Encounter for screening for infections with a predominantly sexual mode of transmission: Secondary | ICD-10-CM | POA: Diagnosis not present

## 2021-10-31 DIAGNOSIS — R5383 Other fatigue: Secondary | ICD-10-CM | POA: Diagnosis not present

## 2021-10-31 DIAGNOSIS — I129 Hypertensive chronic kidney disease with stage 1 through stage 4 chronic kidney disease, or unspecified chronic kidney disease: Secondary | ICD-10-CM | POA: Diagnosis not present

## 2021-10-31 DIAGNOSIS — E785 Hyperlipidemia, unspecified: Secondary | ICD-10-CM

## 2021-10-31 DIAGNOSIS — E1122 Type 2 diabetes mellitus with diabetic chronic kidney disease: Secondary | ICD-10-CM

## 2021-10-31 DIAGNOSIS — N181 Chronic kidney disease, stage 1: Secondary | ICD-10-CM

## 2021-10-31 DIAGNOSIS — N183 Chronic kidney disease, stage 3 unspecified: Secondary | ICD-10-CM | POA: Diagnosis not present

## 2021-10-31 LAB — COMPREHENSIVE METABOLIC PANEL
ALT: 18 U/L (ref 0–53)
AST: 18 U/L (ref 0–37)
Albumin: 4.1 g/dL (ref 3.5–5.2)
Alkaline Phosphatase: 78 U/L (ref 39–117)
BUN: 11 mg/dL (ref 6–23)
CO2: 27 mEq/L (ref 19–32)
Calcium: 8.9 mg/dL (ref 8.4–10.5)
Chloride: 103 mEq/L (ref 96–112)
Creatinine, Ser: 1.42 mg/dL (ref 0.40–1.50)
GFR: 55.97 mL/min — ABNORMAL LOW (ref >60.00)
Glucose, Bld: 118 mg/dL — ABNORMAL HIGH (ref 70–99)
Potassium: 3.8 mEq/L (ref 3.5–5.1)
Sodium: 138 mEq/L (ref 135–145)
Total Bilirubin: 0.5 mg/dL (ref 0.2–1.2)
Total Protein: 6.2 g/dL (ref 6.0–8.3)

## 2021-10-31 LAB — HEMOGLOBIN A1C: Hgb A1c MFr Bld: 5.9 % (ref 4.6–6.5)

## 2021-10-31 MED ORDER — EMPAGLIFLOZIN 10 MG PO TABS: 10.0000 mg | ORAL_TABLET | Freq: Every day | ORAL | 2 refills | Status: DC

## 2021-10-31 NOTE — Patient Instructions (Addendum)
I am recommending that you consider starting a  medication called Jardiance.  George Fowler is an SLGT 2  Inhibitor .  It works to lower blood sugars by routing the excess sugar through the kidneys into the urine. It does make you urinate more, but it has been proven to have a  protective effect on  your heart AND ON YOUR KIDNEYS .  It actually lowers  your risk for heart attack   Your Blood pressure is now on the low side.  STOP THE HCTZ   and continue losartan.   have George Fowler check bp  in   2  weeks    If the new medication  (jardiance) is too expensive,  let George Fowler know to tell me.

## 2021-10-31 NOTE — Progress Notes (Addendum)
Subjective:  Patient ID: George Fowler, male    DOB: 05-08-1965  Age: 56 y.o. MRN: [id number]  CC: The primary encounter diagnosis was Type 2 DM with CKD stage 3 and hypertension ([place]). 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   HPI George Fowler presents for follow up on type 2 DM with  CKD and hyperlipidemia  Chief Complaint  Patient presents with   Follow-up    6 month follow up    1)  T2DM:  patient continues to control his diabetes with diet and is reluctant to accept the diagnosis  which was made in Nov 2021 with an a1c of 6.5 .  He does not check blood sugars. He has CKD but has deferred nephrology follow up,  pneumonia vaccine .  Taking a statin and ARB   2) HTN: Hypertension: patient checks blood pressure monthly  at home.  Readings have been for the most part < 140/80 at rest . Patient is following a reduce salt diet most days and is taking losartan/hct  as prescribed    Outpatient Medications Prior to Visit  Medication Sig Dispense Refill   atorvastatin (LIPITOR) 20 MG tablet TAKE 1 TABLET BY MOUTH AT  BEDTIME 90 tablet 3   Calcium Carbonate-Vitamin D 600-400 MG-UNIT tablet Take 1 tablet by mouth daily.     fluticasone (FLONASE) 50 MCG/ACT nasal spray Place 2 sprays into both nostrils daily. 16 g 6   gabapentin (NEURONTIN) 300 MG capsule TAKE 3 CAPSULES BY MOUTH 3  TIMES DAILY 810 capsule 3   losartan (COZAAR) 100 MG tablet TAKE 1 TABLET BY MOUTH  DAILY 90 tablet 3   sildenafil (REVATIO) 20 MG tablet Take 1 tablet (20 mg total) by mouth 3 (three) times daily. 270 tablet 3   Vitamin A 10000 units TABS Take 1 tablet by mouth daily.     vitamin B-12 (CYANOCOBALAMIN) 500 MCG tablet Take 500 mcg by mouth 2 (two) times daily.       hydrochlorothiazide (HYDRODIURIL)  12.5 MG tablet TAKE 1 TABLET BY MOUTH  DAILY 90 tablet 3   mometasone (ELOCON) 0.1 % cream Use twice daily as needed for eczema (Patient not taking: Reported on 10/31/2021) 15 g 1   No facility-administered medications prior to visit.    Review of Systems;  Patient denies headache, fevers, malaise, unintentional weight loss, skin rash, eye pain, sinus congestion and sinus pain, sore throat, dysphagia,  hemoptysis , cough, dyspnea, wheezing, chest pain, palpitations, orthopnea, edema, abdominal pain, nausea, melena, diarrhea, constipation, flank pain, dysuria, hematuria, urinary  Frequency, nocturia, numbness, tingling, seizures,  Focal weakness, Loss of consciousness,  Tremor, insomnia, depression, anxiety, and suicidal ideation.      Objective:  BP 104/66 (BP Location: Left Arm, Patient Position: Sitting, Cuff Size: Normal)   Pulse 95   Temp 97.6 F (36.4 C) (Oral)   Ht _0  (1.753 m)   Wt 187 lb 6.4 oz (85 kg)   SpO2 96%   BMI 27.67 kg/m   BP Readings from Last 3 Encounters:  10/31/21 104/66  09/10/21 124/78  08/21/21 96/70    Wt Readings from Last 3 Encounters:  10/31/21 187 lb 6.4 oz (85 kg)  09/10/21 183 lb 12.8 oz (83.4 kg)  08/21/21 181 lb 12.8 oz (82.5 kg)    General appearance:  alert, cooperative and appears stated age Ears: normal TM's and external ear canals both ears Throat: lips, mucosa, and tongue normal; teeth and gums normal Neck: no adenopathy, no carotid bruit, supple, symmetrical, trachea midline and thyroid not enlarged, symmetric, no tenderness/mass/nodules Back: symmetric, no curvature. ROM normal. No CVA tenderness. Lungs: clear to auscultation bilaterally Heart: regular rate and rhythm, S1, S2 normal, no murmur, click, rub or gallop Abdomen: soft, non-tender; bowel sounds normal; no masses,  no organomegaly Pulses: 2+ and symmetric Skin: Skin color, texture, turgor normal. No rashes or lesions Lymph nodes: Cervical, supraclavicular, and axillary  nodes normal.  Lab Results  Component Value Date   HGBA1C 5.9 10/31/2021   HGBA1C 5.9 04/23/2021   HGBA1C 5.8 11/29/2020    Lab Results  Component Value Date   CREATININE 1.42 10/31/2021   CREATININE 1.57 (H) 04/23/2021   CREATININE 1.60 (H) 11/29/2020    Lab Results  Component Value Date   WBC 9.6 10/30/2016   HGB 15.3 10/30/2016   HCT 46.1 10/30/2016   PLT 187.0 10/30/2016   GLUCOSE 118 (H) 10/31/2021   CHOL 149 04/23/2021   TRIG 116.0 04/23/2021   HDL 39.90 04/23/2021   LDLDIRECT 63.0 05/05/2019   LDLCALC 86 04/23/2021   ALT 18 10/31/2021   AST 18 10/31/2021   NA 138 10/31/2021   K 3.8 10/31/2021   CL 103 10/31/2021   CREATININE 1.42 10/31/2021   BUN 11 10/31/2021   CO2 27 10/31/2021   TSH 0.92 04/23/2021   PSA 1.31 04/21/2020   HGBA1C 5.9 10/31/2021   MICROALBUR <0.7 05/09/2021    DG Tibia/Fibula Right  Result Date: 09/19/2017 CLINICAL DATA:  Infection/cellulitis. EXAM: RIGHT TIBIA AND FIBULA - 2 VIEW COMPARISON:  No recent prior. FINDINGS: No acute bony or joint abnormality. No evidence of fracture or dislocation. Diffuse soft tissue swelling. IMPRESSION: Diffuse soft tissue swelling.  No acute bony or joint abnormality. 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  No formal diagnosis ,  Continue gabapentin.        Hyperlipidemia LDL goal <100   Type 2 DM with CKD stage 3 and hypertension ([place]) - Primary    Diagnosed in February 2021 with an A1c of  6.5.   He has kept his a1c < 6.0 with dietary restrictions and exercise.  We  discussed starting Jardiance given his CKD  And PAD, but he states that he cannot afford the copay.  pharmacy consult ordered . He has deferred nephrology follow up.     HCTZ discontinued today.   Advised to Continue statin and ARB .  Vaccinations are not up to date by patient choice.         Relevant Medications   empagliflozin (JARDIANCE) 10 MG TABS tablet   Other Relevant Orders   Comp Met (CMET) (Completed)   HgB A1c (Completed)   Amb Referral to Clinical Pharmacist   Other Visit Diagnoses     Screen for STD (sexually transmitted disease)       Relevant Orders   HIV Antibody (routine testing w rflx) (Completed)   Hepatitis C antibody (Completed)       I spent a total of 34 minutes with this patient in a face to face visit on the date of this encounter reviewing the last office visit with me in February,  counselling on his chronic kidney disease, ,  patient's diet and eating habits, home blood pressure readings ,  and post visit ordering of testing and therapeutics.    Follow-up: Return in about 6 months (around 05/03/2022) for follow up diabetes.   Crecencio Mc, MD

## 2021-10-31 NOTE — Assessment & Plan Note (Addendum)
Diagnosed in February 2021 with an A1c of  6.5.   He has kept his a1c < 6.0 with dietary restrictions and exercise.  We  discussed starting Jardiance given his CKD  And PAD, but he states that he cannot afford the copay.  pharmacy consult ordered . He has deferred nephrology follow up.     HCTZ discontinued today.   Advised to Continue statin and ARB .  Vaccinations are not up to date by patient choice.

## 2021-10-31 NOTE — Assessment & Plan Note (Signed)
Stopping hctz due to relatively low B P and plans to start Jardiance/   Lab Results  Component Value Date   CREATININE 1.42 10/31/2021   Lab Results  Component Value Date   NA 138 10/31/2021   K 3.8 10/31/2021   CL 103 10/31/2021   CO2 27 10/31/2021   Lab Results  Component Value Date   MICROALBUR <0.7 05/09/2021   MICROALBUR <0.7 04/21/2020

## 2021-10-31 NOTE — Assessment & Plan Note (Signed)
Attributed to crush inujry of left foot in 2003 and continued favoring of right foot.  No formal diagnosis ,  Continue gabapentin.

## 2021-11-01 LAB — HIV ANTIBODY (ROUTINE TESTING W REFLEX): HIV 1&2 Ab, 4th Generation: NONREACTIVE

## 2021-11-01 LAB — HEPATITIS C ANTIBODY: Hepatitis C Ab: NONREACTIVE

## 2021-11-04 NOTE — Addendum Note (Signed)
Addended by: Sherlene Shams on: 11/04/2021 02:27 PM   Modules accepted: Orders

## 2021-11-05 NOTE — Addendum Note (Signed)
Addended by: Sherlene Shams on: 11/05/2021 06:02 PM   Modules accepted: Orders

## 2021-11-06 ENCOUNTER — Telehealth: Payer: Self-pay | Admitting: Pharmacist

## 2021-11-06 NOTE — Progress Notes (Signed)
[place] [facility])  [place] Team    11/06/2021  George Fowler [birth year] [id number]  Reason for referral: Medication Assistance  Referral source: Dr. Derrel Nip Current insurance: [facility]  PMHx includes but not limited to:  DMT2  Outreach:  Successful telephone call with Mr. Jsoeph Podesta.  HIPAA identifiers verified.   Objective: The 10-year ASCVD risk score (Arnett DK, et al., 2019) is: 6.8%   Values used to calculate the score:     Age: 56 years     Sex: Male     Is Non-Hispanic African American: No     Diabetic: Yes     Tobacco smoker: No     Systolic Blood Pressure: 532 mmHg     Is BP treated: Yes     HDL Cholesterol: 39.9 mg/dL     Total Cholesterol: 149 mg/dL  Lab Results  Component Value Date   CREATININE 1.42 10/31/2021   CREATININE 1.57 (H) 04/23/2021   CREATININE 1.60 (H) 11/29/2020    Lab Results  Component Value Date   HGBA1C 5.9 10/31/2021    Lipid Panel     Component Value Date/Time   CHOL 149 04/23/2021 1111   TRIG 116.0 04/23/2021 1111   HDL 39.90 04/23/2021 1111   CHOLHDL 4 04/23/2021 1111   VLDL 23.2 04/23/2021 1111   LDLCALC 86 04/23/2021 1111   LDLCALC 72 02/06/2018 1512   LDLDIRECT 63.0 05/05/2019 0953    BP Readings from Last 3 Encounters:  10/31/21 104/66  09/10/21 124/78  08/21/21 96/70    Allergies  Allergen Reactions   Penicillins     REACTION: rash    Assessment: Reviewed the patient assistance program with Mr. Raine, he is interested in applying however wishes to discuss the details and complete the application process in person. I will be in [place] at the [facility] office over the next two days and have agreed to meet him in person at the [facility] office.  Patient will contact me when he is able to come.    Medication Assistance Findings:  Medication assistance needs identified: Jardiance  Extra Help:  Not eligible for Extra Help Low Income Subsidy based on reported income and  assets  Patient Assistance Programs: Jardiance made by [employer] requirement met: Yes and will review documents with patient  Out-of-pocket prescription expenditure met:   Not Applicable Reviewed program requirements with patient.    Plan: Mr. Vigo will contact me in the next few days to set up a face to face meeting to review and potentially complete patient assistance paperwork for Jardiance.  Loretha Brasil, PharmD [place] Pharmacist Office: [phone]

## 2021-11-07 NOTE — Progress Notes (Signed)
[facility] Customer service manager [facility])                                            [facility] Quality Pharmacy Team    11/07/2021  KENDELL SAGRAVES 1965/04/09 [id number]  Received telephone call from Mr. Chizek today. We were able to review the income specifications for the patient assistance programs. Patient does not meet the income requirements for patient assistance at this time. Patient expressed appreciation for my outreach.   Reynold Bowen, PharmD [facility]  [facility] Clinical Pharmacist Office: [phone]

## 2021-11-09 ENCOUNTER — Other Ambulatory Visit: Payer: Self-pay | Admitting: Internal Medicine

## 2021-12-10 ENCOUNTER — Telehealth: Payer: Self-pay

## 2021-12-10 NOTE — Telephone Encounter (Signed)
error 

## 2021-12-10 NOTE — Telephone Encounter (Signed)
Per Dr. Derrel Nip pt was given Jardiance 10 mg samples.   Medication Samples have been provided to the patient.  Drug name: Jardiance       Strength: 10 mg        Qty: 2 boxes  LOT: [id number], [id number]  Exp.Date: 03/19/2023, 12/17/2023  Dosing instructions: Take 1 tablet daily by mouth before breakfast.   The patient has been instructed regarding the correct time, dose, and frequency of taking this medication, including desired effects and most common side effects.   George Fowler 1:47 PM 12/10/2021

## 2021-12-24 ENCOUNTER — Other Ambulatory Visit: Payer: Self-pay | Admitting: Internal Medicine

## 2022-02-05 ENCOUNTER — Other Ambulatory Visit: Payer: Self-pay | Admitting: Internal Medicine

## 2022-02-05 DIAGNOSIS — E785 Hyperlipidemia, unspecified: Secondary | ICD-10-CM

## 2022-02-20 ENCOUNTER — Other Ambulatory Visit: Payer: Self-pay | Admitting: Internal Medicine

## 2022-02-20 NOTE — Telephone Encounter (Signed)
Is it ok to refuse? Medication was discontinued on 10/31/2021.

## 2022-03-08 ENCOUNTER — Telehealth: Payer: Self-pay

## 2022-03-08 NOTE — Telephone Encounter (Signed)
Medication Samples have been provided to the patient.  Drug name: Jardiance       Strength: 10mg         Qty: 28  LOT  Exp.Date: Nov2025  Dosing instructions: Take 1 tablet daily before breakfast  The patient has been instructed regarding the correct time, dose, and frequency of taking this medication, including desired effects and most common side effects.   Nov2225 10:34 AM 03/08/2022

## 2022-04-01 ENCOUNTER — Other Ambulatory Visit: Payer: Self-pay

## 2022-04-01 MED ORDER — EMPAGLIFLOZIN 10 MG PO TABS: 10.0000 mg | ORAL_TABLET | Freq: Every day | ORAL | 1 refills | Status: DC

## 2022-06-09 ENCOUNTER — Other Ambulatory Visit: Payer: Self-pay | Admitting: Internal Medicine

## 2022-07-25 ENCOUNTER — Encounter: Payer: Self-pay | Admitting: Internal Medicine

## 2022-07-25 ENCOUNTER — Ambulatory Visit (INDEPENDENT_AMBULATORY_CARE_PROVIDER_SITE_OTHER): Payer: Medicare Other | Admitting: Internal Medicine

## 2022-07-25 VITALS — BP 126/72 | HR 62 | Temp 97.6°F | Ht 69.0 in | Wt 175.0 lb

## 2022-07-25 DIAGNOSIS — I129 Hypertensive chronic kidney disease with stage 1 through stage 4 chronic kidney disease, or unspecified chronic kidney disease: Secondary | ICD-10-CM

## 2022-07-25 DIAGNOSIS — E785 Hyperlipidemia, unspecified: Secondary | ICD-10-CM | POA: Diagnosis not present

## 2022-07-25 DIAGNOSIS — G5790 Unspecified mononeuropathy of unspecified lower limb: Secondary | ICD-10-CM | POA: Diagnosis not present

## 2022-07-25 DIAGNOSIS — R5383 Other fatigue: Secondary | ICD-10-CM | POA: Diagnosis not present

## 2022-07-25 DIAGNOSIS — E559 Vitamin D deficiency, unspecified: Secondary | ICD-10-CM

## 2022-07-25 DIAGNOSIS — Z125 Encounter for screening for malignant neoplasm of prostate: Secondary | ICD-10-CM

## 2022-07-25 DIAGNOSIS — E1122 Type 2 diabetes mellitus with diabetic chronic kidney disease: Secondary | ICD-10-CM | POA: Diagnosis not present

## 2022-07-25 DIAGNOSIS — N183 Chronic kidney disease, stage 3 unspecified: Secondary | ICD-10-CM

## 2022-07-25 LAB — COMPREHENSIVE METABOLIC PANEL
ALT: 25 U/L (ref 0–53)
AST: 20 U/L (ref 0–37)
Albumin: 4.4 g/dL (ref 3.5–5.2)
Alkaline Phosphatase: 97 U/L (ref 39–117)
BUN: 15 mg/dL (ref 6–23)
CO2: 27 mEq/L (ref 19–32)
Calcium: 9.4 mg/dL (ref 8.4–10.5)
Chloride: 105 mEq/L (ref 96–112)
Creatinine, Ser: 1.57 mg/dL — ABNORMAL HIGH (ref 0.40–1.50)
GFR: 49.36 mL/min — ABNORMAL LOW (ref >60.00)
Glucose, Bld: 81 mg/dL (ref 70–99)
Potassium: 4.3 mEq/L (ref 3.5–5.1)
Sodium: 141 mEq/L (ref 135–145)
Total Bilirubin: 0.9 mg/dL (ref 0.2–1.2)
Total Protein: 6.7 g/dL (ref 6.0–8.3)

## 2022-07-25 LAB — LDL CHOLESTEROL, DIRECT: Direct LDL: 102 mg/dL

## 2022-07-25 LAB — TSH: TSH: 1.33 u[IU]/mL (ref 0.35–5.50)

## 2022-07-25 LAB — CBC WITH DIFFERENTIAL/PLATELET
Basophils Absolute: 0.1 10*3/uL (ref 0.0–0.1)
Basophils Relative: 1 % (ref 0.0–3.0)
Eosinophils Absolute: 0.3 10*3/uL (ref 0.0–0.7)
Eosinophils Relative: 3.7 % (ref 0.0–5.0)
HCT: 46.8 % (ref 39.0–52.0)
Hemoglobin: 15.7 g/dL (ref 13.0–17.0)
Lymphocytes Relative: 28.8 % (ref 12.0–46.0)
Lymphs Abs: 2.2 10*3/uL (ref 0.7–4.0)
MCHC: 33.5 g/dL (ref 30.0–36.0)
MCV: 94.6 fl (ref 78.0–100.0)
Monocytes Absolute: 0.5 10*3/uL (ref 0.1–1.0)
Monocytes Relative: 7 % (ref 3.0–12.0)
Neutro Abs: 4.6 10*3/uL (ref 1.4–7.7)
Neutrophils Relative %: 59.5 % (ref 43.0–77.0)
Platelets: 182 10*3/uL (ref 150.0–400.0)
RBC: 4.95 Mil/uL (ref 4.22–5.81)
RDW: 12.4 % (ref 11.5–15.5)
WBC: 7.7 10*3/uL (ref 4.0–10.5)

## 2022-07-25 LAB — LIPID PANEL
Cholesterol: 174 mg/dL (ref 0–200)
HDL: 43.2 mg/dL (ref >39.00)
LDL Cholesterol: 101 mg/dL — ABNORMAL HIGH (ref 0–99)
NonHDL: 130.39
Total CHOL/HDL Ratio: 4
Triglycerides: 149 mg/dL (ref 0.0–149.0)
VLDL: 29.8 mg/dL (ref 0.0–40.0)

## 2022-07-25 LAB — HEMOGLOBIN A1C: Hgb A1c MFr Bld: 5.8 % (ref 4.6–6.5)

## 2022-07-25 LAB — MICROALBUMIN / CREATININE URINE RATIO
Creatinine,U: 20.8 mg/dL
Microalb Creat Ratio: 3.4 mg/g (ref 0.0–30.0)
Microalb, Ur: <0.7 mg/dL (ref 0.0–1.9)

## 2022-07-25 LAB — VITAMIN D 25 HYDROXY (VIT D DEFICIENCY, FRACTURES): VITD: 65.14 ng/mL (ref 30.00–100.00)

## 2022-07-25 LAB — PSA, MEDICARE: PSA: 1.31 ng/ml (ref 0.10–4.00)

## 2022-07-25 MED ORDER — LOSARTAN POTASSIUM 100 MG PO TABS: 100.0000 mg | ORAL_TABLET | Freq: Every day | ORAL | 2 refills | Status: DC

## 2022-07-25 NOTE — Assessment & Plan Note (Signed)
Attributed to crush inujry of left foot in 2003 RESULTING in disability and continued favoring of right foot.  No formal diagnosis ,  Continue gabapentin.
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
## 2022-07-25 NOTE — Patient Instructions (Signed)
Congratulations on the weight loss !  See you in 6 months  

## 2022-07-25 NOTE — Assessment & Plan Note (Addendum)
Diagnosed in February 2021 with an A1c of  6.5.   He has kept his a1c < 6.0 with dietary restrictions and exercise.   He was tolerating Jardiance supplied via samples from the office because he does not meet income criteria for pharmacy assistance  He has CKD  And PAD, made available through [facility] .  He has made had nephrology follow up.     Tolerating  statin and ARB .  Vaccinations are not up to date by patient choice.

## 2022-10-17 ENCOUNTER — Other Ambulatory Visit: Payer: Self-pay | Admitting: Internal Medicine

## 2022-10-17 DIAGNOSIS — E785 Hyperlipidemia, unspecified: Secondary | ICD-10-CM

## 2022-11-16 ENCOUNTER — Other Ambulatory Visit: Payer: Self-pay | Admitting: Internal Medicine

## 2023-01-27 ENCOUNTER — Ambulatory Visit (INDEPENDENT_AMBULATORY_CARE_PROVIDER_SITE_OTHER): Payer: Medicare Other | Admitting: Internal Medicine

## 2023-01-27 ENCOUNTER — Encounter: Payer: Self-pay | Admitting: Internal Medicine

## 2023-01-27 VITALS — BP 126/66 | HR 69 | Ht 69.0 in | Wt 176.8 lb

## 2023-01-27 DIAGNOSIS — E785 Hyperlipidemia, unspecified: Secondary | ICD-10-CM

## 2023-01-27 DIAGNOSIS — E1122 Type 2 diabetes mellitus with diabetic chronic kidney disease: Secondary | ICD-10-CM | POA: Diagnosis not present

## 2023-01-27 DIAGNOSIS — N181 Chronic kidney disease, stage 1: Secondary | ICD-10-CM | POA: Diagnosis not present

## 2023-01-27 DIAGNOSIS — S97102S Crushing injury of unspecified left toe(s), sequela: Secondary | ICD-10-CM | POA: Diagnosis not present

## 2023-01-27 DIAGNOSIS — I129 Hypertensive chronic kidney disease with stage 1 through stage 4 chronic kidney disease, or unspecified chronic kidney disease: Secondary | ICD-10-CM

## 2023-01-27 DIAGNOSIS — S9782XS Crushing injury of left foot, sequela: Secondary | ICD-10-CM

## 2023-01-27 DIAGNOSIS — N183 Chronic kidney disease, stage 3 unspecified: Secondary | ICD-10-CM

## 2023-01-27 DIAGNOSIS — Z7984 Long term (current) use of oral hypoglycemic drugs: Secondary | ICD-10-CM | POA: Diagnosis not present

## 2023-01-27 LAB — COMPREHENSIVE METABOLIC PANEL
ALT: 23 U/L (ref 0–53)
AST: 19 U/L (ref 0–37)
Albumin: 4.3 g/dL (ref 3.5–5.2)
Alkaline Phosphatase: 94 U/L (ref 39–117)
BUN: 16 mg/dL (ref 6–23)
CO2: 28 meq/L (ref 19–32)
Calcium: 8.9 mg/dL (ref 8.4–10.5)
Chloride: 106 meq/L (ref 96–112)
Creatinine, Ser: 1.47 mg/dL (ref 0.40–1.50)
GFR: 53.23 mL/min — ABNORMAL LOW (ref >60.00)
Glucose, Bld: 90 mg/dL (ref 70–99)
Potassium: 4.4 meq/L (ref 3.5–5.1)
Sodium: 140 meq/L (ref 135–145)
Total Bilirubin: 0.8 mg/dL (ref 0.2–1.2)
Total Protein: 6.4 g/dL (ref 6.0–8.3)

## 2023-01-27 LAB — LIPID PANEL
Cholesterol: 173 mg/dL (ref 0–200)
HDL: 46 mg/dL (ref >39.00)
LDL Cholesterol: 99 mg/dL (ref 0–99)
NonHDL: 126.71
Total CHOL/HDL Ratio: 4
Triglycerides: 141 mg/dL (ref 0.0–149.0)
VLDL: 28.2 mg/dL (ref 0.0–40.0)

## 2023-01-27 LAB — LDL CHOLESTEROL, DIRECT: Direct LDL: 99 mg/dL

## 2023-01-27 LAB — HEMOGLOBIN A1C: Hgb A1c MFr Bld: 5.8 % (ref 4.6–6.5)

## 2023-01-27 NOTE — Assessment & Plan Note (Addendum)
Improved control wit h losartan  and hydrochlorothiazide  Continue Jardiance/   Lab Results  Component Value Date   CREATININE 1.47 01/27/2023   Lab Results  Component Value Date   NA 140 01/27/2023   K 4.4 01/27/2023   CL 106 01/27/2023   CO2 28 01/27/2023   Lab Results  Component Value Date   MICROALBUR <0.7 07/25/2022   MICROALBUR <0.7 05/09/2021

## 2023-01-27 NOTE — Assessment & Plan Note (Signed)
Diagnosed in February 2021 with an A1c of  6.5.   He has kept his a1c < 6.0 with dietary restrictions and exercise.   He was tolerating Jardiance supplied via samples from the office because he does not meet income criteria for pharmacy assistance  He has CKD  And PAD, made available through [facility] .  He has made had nephrology follow up.     Tolerating  statin and ARB .  Vaccinations are not up to date by patient choice.   Lab Results  Component Value Date   HGBA1C 5.8 01/27/2023   Lab Results  Component Value Date   MICROALBUR <0.7 07/25/2022   MICROALBUR <0.7 05/09/2021

## 2023-01-27 NOTE — Assessment & Plan Note (Signed)
He has a peripheral neuropathy from a remote crush injury

## 2023-01-27 NOTE — Assessment & Plan Note (Signed)
Managed with atorvastatin 20 mg daily   Lab Results  Component Value Date   CHOL 173 01/27/2023   HDL 46.00 01/27/2023   LDLCALC 99 01/27/2023   LDLDIRECT 99.0 01/27/2023   TRIG 141.0 01/27/2023   CHOLHDL 4 01/27/2023

## 2023-01-27 NOTE — Progress Notes (Signed)
Subjective:  Patient ID: George Fowler, male    DOB: 12-29-1965  Age: 57 y.o. MRN: [id number]  CC: The primary encounter diagnosis was Type 2 DM with CKD stage 3 and hypertension (HCC). Diagnoses of Hyperlipidemia LDL goal <100, Benign hypertension with CKD (chronic kidney disease) stage I, and Crushing injury of foot with toe, left, sequela were also pertinent to this visit.   HPI George Fowler presents for  Chief Complaint  Patient presents with   Medical Management of Chronic Issues    6 month follow up    1) T2DM:  he  feels generally well, is  exercising regularly ; weight is stable.  Not  Checking  blood sugars;  no symptoms of  hypoglycemia. BS have been under 130 fasting and < 150 post prandially.  Denies any recent hypoglyemic events.  Taking  Jardiance , losartan and atorvastatin as directed. Following a carbohydrate modified diet 6 days per week. Denies  new onset numbness, burning and tingling of extremities  has numbness and tingling of his  big  toe on left since his crush injury  . Appetite is good.    2) ED: not using Sildenafil . using a commercial product that he states works better    Outpatient Medications Prior to Visit  Medication Sig Dispense Refill   atorvastatin (LIPITOR) 20 MG tablet TAKE 1 TABLET BY MOUTH AT  BEDTIME 100 tablet 2   Calcium Carbonate-Vitamin D 600-400 MG-UNIT tablet Take 1 tablet by mouth daily.     fluticasone (FLONASE) 50 MCG/ACT nasal spray Place 2 sprays into both nostrils daily. 16 g 6   gabapentin (NEURONTIN) 300 MG capsule TAKE 3 CAPSULES BY MOUTH 3 TIMES DAILY 900 capsule 2   hydrochlorothiazide (HYDRODIURIL) 12.5 MG tablet TAKE 1 TABLET BY MOUTH DAILY 100 tablet 2   JARDIANCE 10 MG TABS tablet TAKE 1 TABLET BY MOUTH DAILY  BEFORE BREAKFAST 100 tablet 2   losartan (COZAAR) 100 MG tablet Take 1 tablet (100 mg total) by mouth daily. 100 tablet 2   Vitamin A 40347 units TABS Take 1 tablet by mouth daily.     vitamin B-12  (CYANOCOBALAMIN) 500 MCG tablet Take 500 mcg by mouth 2 (two) times daily.       sildenafil (REVATIO) 20 MG tablet Take 1 tablet (20 mg total) by mouth 3 (three) times daily. 270 tablet 3   No facility-administered medications prior to visit.    Review of Systems;  Patient denies headache, fevers, malaise, unintentional weight loss, skin rash, eye pain, sinus congestion and sinus pain, sore throat, dysphagia,  hemoptysis , cough, dyspnea, wheezing, chest pain, palpitations, orthopnea, edema, abdominal pain, nausea, melena, diarrhea, constipation, flank pain, dysuria, hematuria, urinary  Frequency, nocturia, numbness, tingling, seizures,  Focal weakness, Loss of consciousness,  Tremor, insomnia, depression, anxiety, and suicidal ideation.      Objective:  BP 126/66   Pulse 69   Ht 5\' 9"  (1.753 m)   Wt 176 lb 12.8 oz (80.2 kg)   SpO2 96%   BMI 26.11 kg/m   BP Readings from Last 3 Encounters:  01/27/23 126/66  07/25/22 126/72  10/31/21 104/66    Wt Readings from Last 3 Encounters:  01/27/23 176 lb 12.8 oz (80.2 kg)  07/25/22 175 lb (79.4 kg)  10/31/21 187 lb 6.4 oz (85 kg)    Physical Exam  Lab Results  Component Value Date   HGBA1C 5.8 01/27/2023   HGBA1C 5.8 07/25/2022   HGBA1C 5.9 10/31/2021  Lab Results  Component Value Date   CREATININE 1.47 01/27/2023   CREATININE 1.57 (H) 07/25/2022   CREATININE 1.42 10/31/2021    Lab Results  Component Value Date   WBC 7.7 07/25/2022   HGB 15.7 07/25/2022   HCT 46.8 07/25/2022   PLT 182.0 07/25/2022   GLUCOSE 90 01/27/2023   CHOL 173 01/27/2023   TRIG 141.0 01/27/2023   HDL 46.00 01/27/2023   LDLDIRECT 99.0 01/27/2023   LDLCALC 99 01/27/2023   ALT 23 01/27/2023   AST 19 01/27/2023   NA 140 01/27/2023   K 4.4 01/27/2023   CL 106 01/27/2023   CREATININE 1.47 01/27/2023   BUN 16 01/27/2023   CO2 28 01/27/2023   TSH 1.33 07/25/2022   PSA 1.31 07/25/2022   HGBA1C 5.8 01/27/2023   MICROALBUR <0.7 07/25/2022         Assessment & Plan:  .Type 2 DM with CKD stage 3 and hypertension [facility]) Assessment & Plan: Diagnosed in February 2021 with an A1c of  6.5.   He has kept his a1c < 6.0 with dietary restrictions and exercise.   He was tolerating Jardiance supplied via samples from the office because he does not meet income criteria for pharmacy assistance  He has CKD  And PAD, made available through [facility] .  He has made had nephrology follow up.     Tolerating  statin and ARB .  Vaccinations are not up to date by patient choice.   Lab Results  Component Value Date   HGBA1C 5.8 01/27/2023   Lab Results  Component Value Date   MICROALBUR <0.7 07/25/2022   MICROALBUR <0.7 05/09/2021       Orders: -     Comprehensive metabolic panel -     Hemoglobin A1c  Hyperlipidemia LDL goal <100 Assessment & Plan: Managed with atorvastatin 20 mg daily   Lab Results  Component Value Date   CHOL 173 01/27/2023   HDL 46.00 01/27/2023   LDLCALC 99 01/27/2023   LDLDIRECT 99.0 01/27/2023   TRIG 141.0 01/27/2023   CHOLHDL 4 01/27/2023     Orders: -     Lipid panel -     LDL cholesterol, direct  Benign hypertension with CKD (chronic kidney disease) stage I Assessment & Plan: Improved control wit h losartan  and hydrochlorothiazide  Continue Jardiance/   Lab Results  Component Value Date   CREATININE 1.47 01/27/2023   Lab Results  Component Value Date   NA 140 01/27/2023   K 4.4 01/27/2023   CL 106 01/27/2023   CO2 28 01/27/2023   Lab Results  Component Value Date   MICROALBUR <0.7 07/25/2022   MICROALBUR <0.7 05/09/2021        Crushing injury of foot with toe, left, sequela Assessment & Plan: He has a peripheral neuropathy from a remote crush injury      I provided 30 minutes of face-to-face time during this encounter reviewing patient's last visit with me, patient's  most recent visit with cardiology,  nephrology,  and neurology,  recent surgical and non surgical procedures,  previous  labs and imaging studies, counseling on currently addressed issues,  and post visit ordering to diagnostics and therapeutics .   Follow-up: Return in about 6 months (around 07/27/2023) for physical.   George Shams, MD

## 2023-01-27 NOTE — Patient Instructions (Addendum)
You're doing great!  I did not change any medications today.   I strongly recommend that you get the TDaP vaccine at [facility] .  It is good for ten years for protection against tetanus,  And for lifetime protection against  diptheria and whooping cough

## 2023-02-07 ENCOUNTER — Telehealth: Payer: Self-pay

## 2023-02-07 NOTE — Patient Outreach (Signed)
Spoke with patient who states he will not do a DM eye exam. George Fowler, CMA Care Guide [employer]

## 2023-02-24 ENCOUNTER — Telehealth: Payer: Self-pay

## 2023-02-24 NOTE — Telephone Encounter (Signed)
Pt aware that samples are available for pickup, was picked up by family member.  Medication Samples have been provided to the patient.  Drug name: Jardiance       Strength: 25mg         Qty: 28  LOT: [id number]  Exp.Date: 01/2025  Dosing instructions: Take one a day as directed  The patient has been instructed regarding the correct time, dose, and frequency of taking this medication, including desired effects and most common side effects.   Thurmond Butts 4:24 PM 02/24/2023

## 2023-02-24 NOTE — Telephone Encounter (Signed)
Pt is out of Jardiance and he is in the doughnut hole. Is it okay to give him samples if we have any?

## 2023-03-24 ENCOUNTER — Telehealth: Payer: Self-pay

## 2023-03-24 NOTE — Telephone Encounter (Signed)
 Medication Samples have been provided to the patient.  Drug name: Jardiance        Strength: 10 mg        Qty: 4 boxes  LOT: [id number]  Exp.Date: 03/2024, 01/2025, 12/2024  Dosing instructions: Take 1 tablet by mouth daily.   The patient has been instructed regarding the correct time, dose, and frequency of taking this medication, including desired effects and most common side effects.   Pranay Hilbun 3:55 PM 03/24/2023

## 2023-03-24 NOTE — Telephone Encounter (Signed)
 Pt is in needed for a PA for Jardiance.

## 2023-04-01 ENCOUNTER — Other Ambulatory Visit (HOSPITAL_COMMUNITY): Payer: Self-pay

## 2023-04-01 NOTE — Telephone Encounter (Signed)
 PA not needed, per test claim, a 30 day supply costs $387.00, due to deductible. I have routed a separate request for pt assistance to [facility] to evaluate whether or not pt would be eligible for [facility] patient assistance. Thank you.

## 2023-04-01 NOTE — Telephone Encounter (Signed)
 Pharmacy Patient Advocate Encounter   Received notification from Pt Calls Messages that prior authorization for Jardiance  10 MG is required/requested.   Insurance verification completed.   The patient is insured through [facility] .   Per test claim: The current 30 day co-pay is, $387.00, due to deductible.  No PA needed at this time. This test claim was processed through [facility]- copay amounts may vary at other pharmacies due to pharmacy/plan contracts, or as the patient moves through the different stages of their insurance plan.

## 2023-04-03 ENCOUNTER — Encounter: Payer: Self-pay | Admitting: Pharmacist

## 2023-04-03 NOTE — Progress Notes (Signed)
Called patient to discuss Jardiance cost.   We discussed in detail the medicare structure this year and what it means to have a drug deductible. However, patient denies having a deductible and states he was told last year the the donut hole would go away.   We did review several times that the donut hole is gone, and that a deductible is something different. Assured him that his copay would decrease after deductible is met though this looped [place] right back to the donut hole each time.   Patient reports he will call his insurance again to see why they are "not covering" the Jardiance.  Advised applying for PAP either way though patient refuses. States he does not want any "hand outs". We reviewed that PAP are extremely common and used by hundreds of our patients who have Medicare. He still declines and would prefer to call his insurance first then will call clinic back.   I do feel he would be eligible for PAP (reports household 1, income ~$27,000).   Loree Fee, PharmD Clinical Pharmacist [facility] [phone]

## 2023-04-04 NOTE — Telephone Encounter (Signed)
Noted  

## 2023-04-15 ENCOUNTER — Telehealth: Payer: Self-pay

## 2023-04-15 MED ORDER — DAPAGLIFLOZIN PROPANEDIOL 5 MG PO TABS: 10.0000 mg | ORAL_TABLET | Freq: Every day | ORAL | 2 refills | Status: DC

## 2023-04-15 NOTE — Telephone Encounter (Signed)
Medication Samples have been provided to the patient.  Drug name: Jardiance       Strength: 10 mg        Qty: 4 boxes  LOT: [id number]  Exp.Date: 02/14/2025  Dosing instructions: Take 1 tablet by mouth daily.  The patient has been instructed regarding the correct time, dose, and frequency of taking this medication, including desired effects and most common side effects.   George Fowler 9:24 AM 04/15/2023

## 2023-04-15 NOTE — Telephone Encounter (Signed)
Pt is wondering if there is a different medication that he can take other than Jardiance. His [place] Pepper is going to cost him $487. Pt is not able to afford that.

## 2023-04-15 NOTE — Telephone Encounter (Signed)
Sending generic Marcelline Deist instead,  same class of drugs,  so provides the same portectin on the heart and kidneys

## 2023-04-15 NOTE — Telephone Encounter (Signed)
Pt is aware.

## 2023-04-15 NOTE — Addendum Note (Signed)
Addended by: Sherlene Shams on: 04/15/2023 11:28 AM   Modules accepted: Orders

## 2023-04-16 ENCOUNTER — Telehealth: Payer: Self-pay

## 2023-04-16 MED ORDER — DAPAGLIFLOZIN PROPANEDIOL 10 MG PO TABS: 10.0000 mg | ORAL_TABLET | Freq: Every day | ORAL | 2 refills | Status: DC

## 2023-04-16 NOTE — Telephone Encounter (Signed)
noted

## 2023-04-16 NOTE — Telephone Encounter (Addendum)
Received a fax from [facility] stating that insurance will only cover 1 tablet daily of George Fowler so if pt needs to take 10 mg they will need a new rx for the 10 mg tablet.

## 2023-05-12 ENCOUNTER — Other Ambulatory Visit: Payer: Self-pay | Admitting: Internal Medicine

## 2023-07-01 ENCOUNTER — Other Ambulatory Visit: Payer: Self-pay | Admitting: Internal Medicine

## 2023-07-01 DIAGNOSIS — E785 Hyperlipidemia, unspecified: Secondary | ICD-10-CM

## 2023-07-16 ENCOUNTER — Other Ambulatory Visit: Payer: Self-pay | Admitting: Internal Medicine

## 2023-07-20 ENCOUNTER — Other Ambulatory Visit: Payer: Self-pay | Admitting: Internal Medicine

## 2023-07-20 DIAGNOSIS — E785 Hyperlipidemia, unspecified: Secondary | ICD-10-CM

## 2023-07-26 ENCOUNTER — Other Ambulatory Visit: Payer: Self-pay | Admitting: Internal Medicine

## 2023-07-30 ENCOUNTER — Encounter: Payer: Self-pay | Admitting: Internal Medicine

## 2023-07-30 ENCOUNTER — Ambulatory Visit: Payer: Medicare Other | Admitting: Internal Medicine

## 2023-07-30 VITALS — BP 126/80 | HR 67 | Temp 97.7°F | Ht 69.0 in | Wt 176.0 lb

## 2023-07-30 DIAGNOSIS — S97102S Crushing injury of unspecified left toe(s), sequela: Secondary | ICD-10-CM | POA: Diagnosis not present

## 2023-07-30 DIAGNOSIS — N181 Chronic kidney disease, stage 1: Secondary | ICD-10-CM | POA: Diagnosis not present

## 2023-07-30 DIAGNOSIS — Z0000 Encounter for general adult medical examination without abnormal findings: Secondary | ICD-10-CM

## 2023-07-30 DIAGNOSIS — R5383 Other fatigue: Secondary | ICD-10-CM

## 2023-07-30 DIAGNOSIS — R972 Elevated prostate specific antigen [PSA]: Secondary | ICD-10-CM | POA: Diagnosis not present

## 2023-07-30 DIAGNOSIS — Z125 Encounter for screening for malignant neoplasm of prostate: Secondary | ICD-10-CM

## 2023-07-30 DIAGNOSIS — N183 Chronic kidney disease, stage 3 unspecified: Secondary | ICD-10-CM

## 2023-07-30 DIAGNOSIS — I129 Hypertensive chronic kidney disease with stage 1 through stage 4 chronic kidney disease, or unspecified chronic kidney disease: Secondary | ICD-10-CM | POA: Diagnosis not present

## 2023-07-30 DIAGNOSIS — Z7984 Long term (current) use of oral hypoglycemic drugs: Secondary | ICD-10-CM

## 2023-07-30 DIAGNOSIS — N1831 Chronic kidney disease, stage 3a: Secondary | ICD-10-CM

## 2023-07-30 DIAGNOSIS — E1122 Type 2 diabetes mellitus with diabetic chronic kidney disease: Secondary | ICD-10-CM | POA: Diagnosis not present

## 2023-07-30 DIAGNOSIS — I672 Cerebral atherosclerosis: Secondary | ICD-10-CM

## 2023-07-30 DIAGNOSIS — S9782XS Crushing injury of left foot, sequela: Secondary | ICD-10-CM | POA: Diagnosis not present

## 2023-07-30 DIAGNOSIS — E785 Hyperlipidemia, unspecified: Secondary | ICD-10-CM | POA: Diagnosis not present

## 2023-07-30 LAB — LIPID PANEL
Cholesterol: 182 mg/dL (ref 0–200)
HDL: 48.8 mg/dL (ref >39.00)
LDL Cholesterol: 106 mg/dL — ABNORMAL HIGH (ref 0–99)
NonHDL: 133.46
Total CHOL/HDL Ratio: 4
Triglycerides: 136 mg/dL (ref 0.0–149.0)
VLDL: 27.2 mg/dL (ref 0.0–40.0)

## 2023-07-30 LAB — HEMOGLOBIN A1C: Hgb A1c MFr Bld: 5.7 % (ref 4.6–6.5)

## 2023-07-30 LAB — PSA, MEDICARE: PSA: 2.34 ng/mL (ref 0.10–4.00)

## 2023-07-30 LAB — CBC WITH DIFFERENTIAL/PLATELET
Basophils Absolute: 0.1 10*3/uL (ref 0.0–0.1)
Basophils Relative: 0.8 % (ref 0.0–3.0)
Eosinophils Absolute: 0.3 10*3/uL (ref 0.0–0.7)
Eosinophils Relative: 3.5 % (ref 0.0–5.0)
HCT: 48.8 % (ref 39.0–52.0)
Hemoglobin: 16 g/dL (ref 13.0–17.0)
Lymphocytes Relative: 22.4 % (ref 12.0–46.0)
Lymphs Abs: 1.7 10*3/uL (ref 0.7–4.0)
MCHC: 32.8 g/dL (ref 30.0–36.0)
MCV: 97 fl (ref 78.0–100.0)
Monocytes Absolute: 0.5 10*3/uL (ref 0.1–1.0)
Monocytes Relative: 6.8 % (ref 3.0–12.0)
Neutro Abs: 5.1 10*3/uL (ref 1.4–7.7)
Neutrophils Relative %: 66.5 % (ref 43.0–77.0)
Platelets: 178 10*3/uL (ref 150.0–400.0)
RBC: 5.03 Mil/uL (ref 4.22–5.81)
RDW: 12.8 % (ref 11.5–15.5)
WBC: 7.7 10*3/uL (ref 4.0–10.5)

## 2023-07-30 LAB — COMPREHENSIVE METABOLIC PANEL WITH GFR
ALT: 30 U/L (ref 0–53)
AST: 22 U/L (ref 0–37)
Albumin: 4.6 g/dL (ref 3.5–5.2)
Alkaline Phosphatase: 89 U/L (ref 39–117)
BUN: 22 mg/dL (ref 6–23)
CO2: 27 meq/L (ref 19–32)
Calcium: 9.4 mg/dL (ref 8.4–10.5)
Chloride: 103 meq/L (ref 96–112)
Creatinine, Ser: 1.53 mg/dL — ABNORMAL HIGH (ref 0.40–1.50)
GFR: 50.55 mL/min — ABNORMAL LOW (ref >60.00)
Glucose, Bld: 94 mg/dL (ref 70–99)
Potassium: 4.1 meq/L (ref 3.5–5.1)
Sodium: 137 meq/L (ref 135–145)
Total Bilirubin: 0.8 mg/dL (ref 0.2–1.2)
Total Protein: 6.8 g/dL (ref 6.0–8.3)

## 2023-07-30 LAB — TSH: TSH: 1.41 u[IU]/mL (ref 0.35–5.50)

## 2023-07-30 LAB — MICROALBUMIN / CREATININE URINE RATIO
Creatinine,U: 41.6 mg/dL
Microalb Creat Ratio: UNDETERMINED mg/g (ref 0.0–30.0)
Microalb, Ur: <0.7 mg/dL

## 2023-07-30 LAB — LDL CHOLESTEROL, DIRECT: Direct LDL: 110 mg/dL

## 2023-07-30 MED ORDER — GABAPENTIN 300 MG PO CAPS: 300.0000 mg | ORAL_CAPSULE | Freq: Two times a day (BID) | ORAL | 2 refills | Status: DC

## 2023-07-30 MED ORDER — EMPAGLIFLOZIN 10 MG PO TABS: 10.0000 mg | ORAL_TABLET | Freq: Every day | ORAL | 2 refills | Status: AC

## 2023-07-30 NOTE — Assessment & Plan Note (Signed)
 He continues to decline vaccinations and eye exam because he does not accept the diagnosis of type 2 DM .  He has lowered his A1c to 5.7 and is taking atorvastatin  and ARB  Lab Results  Component Value Date   HGBA1C 5.7 07/30/2023   Lab Results  Component Value Date   CHOL 182 07/30/2023   HDL 48.80 07/30/2023   LDLCALC 106 (H) 07/30/2023   LDLDIRECT 110.0 07/30/2023   TRIG 136.0 07/30/2023   CHOLHDL 4 07/30/2023   Lab Results  Component Value Date   MICROALBUR <0.7 07/30/2023   MICROALBUR <0.7 07/25/2022

## 2023-07-30 NOTE — Progress Notes (Addendum)
 Patient ID: George Fowler, male    DOB: 1965-12-13  Age: 58 y.o. MRN: [id number]  The patient is here for annual preventive examination and management of other chronic and acute problems.   The risk factors are reflected in the social history.   The roster of all physicians providing medical care to patient - is listed in the Snapshot section of the chart.   Activities of daily living:  The patient is 100% independent in all ADLs: dressing, toileting, feeding as well as independent mobility   Home safety : The patient has smoke detectors in the home. They wear seatbelts.  There are no unsecured firearms at home. There is no violence in the home.    There is no risks for hepatitis, STDs or HIV. There is no   history of blood transfusion. They have no travel history to infectious disease endemic areas of the world.   The patient has seen their dentist in the last six month. They have seen their eye doctor in the last year. The patinet  denies slight hearing difficulty with regard to whispered voices and some television programs.  They have deferred audiologic testing in the last year.  They do not  have excessive sun exposure. Discussed the need for sun protection: hats, long sleeves and use of sunscreen if there is significant sun exposure.    Diet: the importance of a healthy diet is discussed. They do have a healthy diet.   The benefits of regular aerobic exercise were discussed. The patient  exercises  3 tdays per week  for  60 minutes.    Depression screen: there are no signs or vegative symptoms of depression- irritability, change in appetite, anhedonia, sadness/tearfullness.   The following portions of the patient's history were reviewed and updated as appropriate: allergies, current medications, past family history, past medical history,  past surgical history, past social history  and problem list.   Visual acuity was not assessed per patient preference since the patient has regular  follow up with an  ophthalmologist. Hearing and body mass index were assessed and reviewed.    During the course of the visit the patient was educated and counseled about appropriate screening and preventive services including : fall prevention , diabetes screening, nutrition counseling, colorectal cancer screening, and recommended immunizations.    Chief Complaint:   none    Review of Symptoms  Patient denies headache, fevers, malaise, unintentional weight loss, skin rash, eye pain, sinus congestion and sinus pain, sore throat, dysphagia,  hemoptysis , cough, dyspnea, wheezing, chest pain, palpitations, orthopnea, edema, abdominal pain, nausea, melena, diarrhea, constipation, flank pain, dysuria, hematuria, urinary  Frequency, nocturia, numbness, tingling, seizures,  Focal weakness, Loss of consciousness,  Tremor, insomnia, depression, anxiety, and suicidal ideation.    Physical Exam:  BP 126/80   Pulse 67   Temp 97.7 F (36.5 C)   Ht 5\' 9"  (1.753 m)   Wt 176 lb (79.8 kg)   SpO2 99%   BMI 25.99 kg/m    Physical Exam Vitals reviewed.  Constitutional:      General: He is not in acute distress.    Appearance: Normal appearance. He is normal weight. He is not ill-appearing, toxic-appearing or diaphoretic.  HENT:     Head: Normocephalic and atraumatic.     Right Ear: Tympanic membrane, ear canal and external ear normal. There is no impacted cerumen.     Left Ear: Tympanic membrane, ear canal and external ear normal. There is no impacted  cerumen.     Nose: Nose normal.     Mouth/Throat:     Mouth: Mucous membranes are moist.     Pharynx: Oropharynx is clear.  Eyes:     General: No scleral icterus.       Right eye: No discharge.        Left eye: No discharge.     Conjunctiva/sclera: Conjunctivae normal.  Neck:     Thyroid : No thyromegaly.     Vascular: No carotid bruit or JVD.  Cardiovascular:     Rate and Rhythm: Normal rate and regular rhythm.     Heart sounds: Normal  heart sounds.  Pulmonary:     Effort: Pulmonary effort is normal. No respiratory distress.     Breath sounds: Normal breath sounds.  Abdominal:     General: Bowel sounds are normal.     Palpations: Abdomen is soft. There is no mass.     Tenderness: There is no abdominal tenderness. There is no guarding or rebound.  Musculoskeletal:        General: Normal range of motion.     Cervical back: Normal range of motion and neck supple.  Lymphadenopathy:     Cervical: No cervical adenopathy.  Skin:    General: Skin is warm and dry.  Neurological:     General: No focal deficit present.     Mental Status: He is alert and oriented to person, place, and time. Mental status is at baseline.  Psychiatric:        Mood and Affect: Mood normal.        Behavior: Behavior normal.        Thought Content: Thought content normal.        Judgment: Judgment normal.    Assessment and Plan: Type 2 DM with CKD stage 3 and hypertension (HCC) Assessment & Plan: He continues to decline vaccinations and eye exam because he does not accept the diagnosis of type 2 DM .  He has lowered his A1c to 5.7 and is taking atorvastatin  and ARB  Lab Results  Component Value Date   HGBA1C 5.7 07/30/2023   Lab Results  Component Value Date   CHOL 182 07/30/2023   HDL 48.80 07/30/2023   LDLCALC 106 (H) 07/30/2023   LDLDIRECT 110.0 07/30/2023   TRIG 136.0 07/30/2023   CHOLHDL 4 07/30/2023   Lab Results  Component Value Date   MICROALBUR <0.7 07/30/2023   MICROALBUR <0.7 07/25/2022       Orders: -     Microalbumin / creatinine urine ratio -     Hemoglobin A1c -     Comprehensive metabolic panel with GFR  Hyperlipidemia LDL goal <100 Assessment & Plan: Managed with atorvastatin  20 mg daily . NO CHANGES TODAY  Lab Results  Component Value Date   CHOL 182 07/30/2023   HDL 48.80 07/30/2023   LDLCALC 106 (H) 07/30/2023   LDLDIRECT 110.0 07/30/2023   TRIG 136.0 07/30/2023   CHOLHDL 4 07/30/2023      Orders: -     Lipid panel -     LDL cholesterol, direct  Encounter for preventive health examination Assessment & Plan: age appropriate education and counseling updated, referrals for preventative services and immunizations addressed, dietary and smoking counseling addressed, most recent labs reviewed.  I have personally reviewed and have noted:   1) the patient's medical and social history 2) The pt's use of alcohol, tobacco, and illicit drugs 3) The patient's current medications and supplements 4) Functional ability  including ADL's, fall risk, home safety risk, hearing and visual impairment 5) Diet and physical activities 6) Evidence for depression or mood disorder 7) The patient's height, weight, and BMI have been recorded in the chart 8) I have ordered and reviewed a 12 lead EKG and find that there are no acute changes and patient is in sinus rhythm.     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  Continue losartan   and hydrochlorothiazide   advised to resume  Jardiance  if affordable   Lab Results  Component Value Date   MICROALBUR <0.7 07/30/2023   MICROALBUR <0.7 07/25/2022        Lab Results  Component Value Date   CREATININE 1.53 (H) 07/30/2023   Lab Results  Component Value Date   NA 137 07/30/2023   K 4.1 07/30/2023   CL 103 07/30/2023   CO2 27 07/30/2023   Lab Results  Component Value Date   MICROALBUR <0.7 07/30/2023   MICROALBUR <0.7 07/25/2022       Orders: -     Microalbumin / creatinine urine ratio -     Comprehensive metabolic panel with GFR  Other fatigue -     TSH -     CBC with Differential/Platelet  Prostate cancer screening -     PSA, Medicare  Cerebral atherosclerosis Assessment & Plan: Noted on previous head CT.  He is taking atorvastatin  and tolerating it.    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  Will repeat PSA in 6 months    CKD stage 3a, GFR 45-59 ml/min (HCC) Assessment & Plan: GFR is stable.  Continue losartan   and hydrochlorothiazide   advised to resume  Jardiance  if affordable   Lab Results  Component Value Date   MICROALBUR <0.7 07/30/2023   MICROALBUR <0.7 07/25/2022        Lab Results  Component Value Date   CREATININE 1.53 (H) 07/30/2023   Lab Results  Component Value Date   NA 137 07/30/2023   K 4.1 07/30/2023   CL 103 07/30/2023   CO2 27 07/30/2023   Lab Results  Component Value Date   MICROALBUR <0.7 07/30/2023   MICROALBUR <0.7 07/25/2022        Other orders -     Gabapentin ; Take 1 capsule (300 mg total) by mouth 2 (two) times daily.  Dispense: 200 capsule; Refill: 2 -     Empagliflozin ; Take 1 tablet (10 mg total) by mouth daily before breakfast.  Dispense: 100 tablet; Refill: 2    Return in about 6 months (around 01/30/2024) for follow up diabetes, hypertension.   I spent 30 minutes dedicated to the care of this patient on the date of this encounter to include pre-visit review of her medical history,  most recent imaging studies, Face-to-face time with the patient , and post visit ordering of testing and therapeutics.    Thersia Flax, MD

## 2023-07-30 NOTE — Patient Instructions (Signed)
  You are OVERDUE for your tetanus-diptheria-pertussis vaccine   (TDaP)   Please get this done at your pharmacy ;  it will be PAID FOR [facility] MEDICARE ONLY AT [facility]

## 2023-07-30 NOTE — Assessment & Plan Note (Signed)

## 2023-07-31 NOTE — Assessment & Plan Note (Addendum)
 Managed with atorvastatin  20 mg daily . NO CHANGES TODAY  Lab Results  Component Value Date   CHOL 182 07/30/2023   HDL 48.80 07/30/2023   LDLCALC 106 (H) 07/30/2023   LDLDIRECT 110.0 07/30/2023   TRIG 136.0 07/30/2023   CHOLHDL 4 07/30/2023

## 2023-07-31 NOTE — Assessment & Plan Note (Addendum)
 Noted on previous head CT.  He is taking atorvastatin  and tolerating it.

## 2023-07-31 NOTE — Assessment & Plan Note (Addendum)
 GFR is stable.  Continue losartan   and hydrochlorothiazide   advised to resume  Jardiance  if affordable   Lab Results  Component Value Date   MICROALBUR <0.7 07/30/2023   MICROALBUR <0.7 07/25/2022        Lab Results  Component Value Date   CREATININE 1.53 (H) 07/30/2023   Lab Results  Component Value Date   NA 137 07/30/2023   K 4.1 07/30/2023   CL 103 07/30/2023   CO2 27 07/30/2023   Lab Results  Component Value Date   MICROALBUR <0.7 07/30/2023   MICROALBUR <0.7 07/25/2022

## 2023-07-31 NOTE — Assessment & Plan Note (Signed)
He has a peripheral neuropathy from a remote crush injury

## 2023-08-02 ENCOUNTER — Ambulatory Visit: Payer: Self-pay | Admitting: Internal Medicine

## 2023-08-02 DIAGNOSIS — R972 Elevated prostate specific antigen [PSA]: Secondary | ICD-10-CM | POA: Insufficient documentation

## 2023-08-02 NOTE — Assessment & Plan Note (Signed)
 PSA , although normal,  has risen by a full point over the past year.  Will repeat PSA in 6 months

## 2023-08-02 NOTE — Addendum Note (Signed)
 Addended by: Thersia Flax on: 08/02/2023 11:45 AM   Modules accepted: Orders, Level of Service

## 2023-08-26 ENCOUNTER — Telehealth: Payer: Self-pay

## 2023-08-26 NOTE — Telephone Encounter (Signed)
 Spoke with pt to let him know that his handicap placard form is complete and ready for pick up. Pt gave a verbal understanding.

## 2023-09-30 ENCOUNTER — Other Ambulatory Visit: Payer: Self-pay | Admitting: Internal Medicine

## 2023-09-30 DIAGNOSIS — E785 Hyperlipidemia, unspecified: Secondary | ICD-10-CM

## 2023-12-12 ENCOUNTER — Telehealth: Payer: Self-pay

## 2023-12-12 NOTE — Telephone Encounter (Signed)
 Medication Samples have been provided to the patient.  Drug name: Jardiance        Strength: 10 mg        Qty: 2 boxes  LOT: [id number]  Exp.Date: 05/15/2024   Dosing instructions: Take 1 tablet by mouth daily.   The patient has been instructed regarding the correct time, dose, and frequency of taking this medication, including desired effects and most common side effects.   Brylin Stanislawski 1:23 PM 12/12/2023

## 2024-01-12 ENCOUNTER — Other Ambulatory Visit: Payer: Self-pay | Admitting: Internal Medicine

## 2024-01-28 ENCOUNTER — Other Ambulatory Visit

## 2024-01-28 DIAGNOSIS — N183 Chronic kidney disease, stage 3 unspecified: Secondary | ICD-10-CM | POA: Diagnosis not present

## 2024-01-28 DIAGNOSIS — I129 Hypertensive chronic kidney disease with stage 1 through stage 4 chronic kidney disease, or unspecified chronic kidney disease: Secondary | ICD-10-CM | POA: Diagnosis not present

## 2024-01-28 DIAGNOSIS — R972 Elevated prostate specific antigen [PSA]: Secondary | ICD-10-CM | POA: Diagnosis not present

## 2024-01-28 DIAGNOSIS — E1122 Type 2 diabetes mellitus with diabetic chronic kidney disease: Secondary | ICD-10-CM

## 2024-01-28 LAB — PSA, MEDICARE: PSA: 1.26 ng/mL (ref 0.10–4.00)

## 2024-01-28 NOTE — Addendum Note (Signed)
 Addended by: MARYLEN PRO A on: 01/28/2024 08:22 AM   Modules accepted: Orders

## 2024-01-29 ENCOUNTER — Encounter: Payer: Self-pay | Admitting: Internal Medicine

## 2024-01-29 ENCOUNTER — Ambulatory Visit (INDEPENDENT_AMBULATORY_CARE_PROVIDER_SITE_OTHER): Admitting: Internal Medicine

## 2024-01-29 VITALS — BP 126/84 | HR 61 | Ht 69.0 in | Wt 173.6 lb

## 2024-01-29 DIAGNOSIS — L309 Dermatitis, unspecified: Secondary | ICD-10-CM

## 2024-01-29 DIAGNOSIS — E1122 Type 2 diabetes mellitus with diabetic chronic kidney disease: Secondary | ICD-10-CM | POA: Diagnosis not present

## 2024-01-29 DIAGNOSIS — N1831 Chronic kidney disease, stage 3a: Secondary | ICD-10-CM

## 2024-01-29 DIAGNOSIS — I129 Hypertensive chronic kidney disease with stage 1 through stage 4 chronic kidney disease, or unspecified chronic kidney disease: Secondary | ICD-10-CM | POA: Diagnosis not present

## 2024-01-29 DIAGNOSIS — N183 Chronic kidney disease, stage 3 unspecified: Secondary | ICD-10-CM

## 2024-01-29 LAB — COMPREHENSIVE METABOLIC PANEL WITH GFR
AG Ratio: 2.4 (calc) (ref 1.0–2.5)
ALT: 22 U/L (ref 9–46)
AST: 19 U/L (ref 10–35)
Albumin: 4.3 g/dL (ref 3.6–5.1)
Alkaline phosphatase (APISO): 80 U/L (ref 35–144)
BUN/Creatinine Ratio: 11 (calc) (ref 6–22)
BUN: 16 mg/dL (ref 7–25)
CO2: 26 mmol/L (ref 20–32)
Calcium: 9.1 mg/dL (ref 8.6–10.3)
Chloride: 105 mmol/L (ref 98–110)
Creat: 1.44 mg/dL — ABNORMAL HIGH (ref 0.70–1.30)
Globulin: 1.8 g/dL — ABNORMAL LOW (ref 1.9–3.7)
Glucose, Bld: 93 mg/dL (ref 65–99)
Potassium: 4.4 mmol/L (ref 3.5–5.3)
Sodium: 138 mmol/L (ref 135–146)
Total Bilirubin: 0.8 mg/dL (ref 0.2–1.2)
Total Protein: 6.1 g/dL (ref 6.1–8.1)
eGFR: 57 mL/min/1.73m2 — ABNORMAL LOW (ref >=60)

## 2024-01-29 LAB — LIPID PANEL W/REFLEX DIRECT LDL
Cholesterol: 160 mg/dL (ref <200)
HDL: 51 mg/dL (ref >=40)
LDL Cholesterol (Calc): 88 mg/dL
Non-HDL Cholesterol (Calc): 109 mg/dL (ref <130)
Total CHOL/HDL Ratio: 3.1 (calc) (ref <5.0)
Triglycerides: 117 mg/dL (ref <150)

## 2024-01-29 LAB — HEMOGLOBIN A1C
Hgb A1c MFr Bld: 5.5 % (ref <5.7)
Mean Plasma Glucose: 111 mg/dL
eAG (mmol/L): 6.2 mmol/L

## 2024-01-29 MED ORDER — TETANUS-DIPHTH-ACELL PERTUSSIS 5-2-15.5 LF-MCG/0.5 IM SUSP: 0.5000 mL | Freq: Once | INTRAMUSCULAR | 0 refills | Status: AC

## 2024-01-29 MED ORDER — TRIAMCINOLONE ACETONIDE 0.1 % EX CREA: 1.0000 | TOPICAL_CREAM | Freq: Two times a day (BID) | CUTANEOUS | 0 refills | Status: AC

## 2024-01-29 NOTE — Patient Instructions (Signed)
 You are doing great!  All of your labs are improving  I have prescribed a steroid ointment for your eczema on your left hand.  Use it twice daily  as needed    See you in 6 months.  Pre-visit labs ordered

## 2024-01-29 NOTE — Assessment & Plan Note (Signed)
 He continues to decline vaccinations and eye exam because he does not accept the diagnosis of type 2 DM .  He has lowered his A1c to 5.7 and is taking atorvastatin  and ARB  Lab Results  Component Value Date   HGBA1C 5.5 01/28/2024   Lab Results  Component Value Date   CHOL 160 01/28/2024   HDL 51 01/28/2024   LDLCALC 88 01/28/2024   LDLDIRECT 110.0 07/30/2023   TRIG 117 01/28/2024   CHOLHDL 3.1 01/28/2024   Lab Results  Component Value Date   MICROALBUR <0.7 07/30/2023

## 2024-01-29 NOTE — Assessment & Plan Note (Addendum)
 GFR is stable.  He has declined nephrology  referral .  He has no proteinuria.  Continue losartan   and Jardiance  .  Hydrochlorothiazide  discontinued  Lab Results  Component Value Date   MICROALBUR <0.7 07/30/2023        Lab Results  Component Value Date   CREATININE 1.44 (H) 01/28/2024   Lab Results  Component Value Date   NA 138 01/28/2024   K 4.4 01/28/2024   CL 105 01/28/2024   CO2 26 01/28/2024   Lab Results  Component Value Date   MICROALBUR <0.7 07/30/2023

## 2024-01-29 NOTE — Progress Notes (Unsigned)
 Subjective:  Patient ID: George Fowler, male    DOB: 1965-12-28  Age: 58 y.o. MRN: [id number]  CC: There were no encounter diagnoses.   HPI VITOR OVERBAUGH presents for  Chief Complaint  Patient presents with   Medical Management of Chronic Issues      Outpatient Medications Prior to Visit  Medication Sig Dispense Refill   atorvastatin  (LIPITOR) 20 MG tablet TAKE 1 TABLET BY MOUTH AT  BEDTIME 100 tablet 2   Calcium  Carbonate-Vitamin D  600-400 MG-UNIT tablet Take 1 tablet by mouth daily.     empagliflozin  (JARDIANCE ) 10 MG TABS tablet Take 1 tablet (10 mg total) by mouth daily before breakfast. 100 tablet 2   fluticasone  (FLONASE ) 50 MCG/ACT nasal spray Place 2 sprays into both nostrils daily. 16 g 6   gabapentin  (NEURONTIN ) 300 MG capsule Take 1 capsule (300 mg total) by mouth 2 (two) times daily. 200 capsule 2   losartan  (COZAAR ) 100 MG tablet TAKE 1 TABLET BY MOUTH DAILY 100 tablet 2   Vitamin A 10000 units TABS Take 1 tablet by mouth daily.     vitamin B-12 (CYANOCOBALAMIN ) 500 MCG tablet Take 500 mcg by mouth 2 (two) times daily.       hydrochlorothiazide  (HYDRODIURIL ) 12.5 MG tablet TAKE 1 TABLET BY MOUTH DAILY (Patient not taking: Reported on 01/29/2024) 100 tablet 2   No facility-administered medications prior to visit.    Review of Systems;  Patient denies headache, fevers, malaise, unintentional weight loss, skin rash, eye pain, sinus congestion and sinus pain, sore throat, dysphagia,  hemoptysis , cough, dyspnea, wheezing, chest pain, palpitations, orthopnea, edema, abdominal pain, nausea, melena, diarrhea, constipation, flank pain, dysuria, hematuria, urinary  Frequency, nocturia, numbness, tingling, seizures,  Focal weakness, Loss of consciousness,  Tremor, insomnia, depression, anxiety, and suicidal ideation.      Objective:  BP 126/84   Pulse 61   Ht 5' 9 (1.753 m)   Wt 173 lb 9.6 oz (78.7 kg)   SpO2 98%   BMI 25.64 kg/m   BP Readings from Last 3  Encounters:  01/29/24 126/84  07/30/23 126/80  01/27/23 126/66    Wt Readings from Last 3 Encounters:  01/29/24 173 lb 9.6 oz (78.7 kg)  07/30/23 176 lb (79.8 kg)  01/27/23 176 lb 12.8 oz (80.2 kg)    Physical Exam  Lab Results  Component Value Date   HGBA1C 5.5 01/28/2024   HGBA1C 5.7 07/30/2023   HGBA1C 5.8 01/27/2023    Lab Results  Component Value Date   CREATININE 1.44 (H) 01/28/2024   CREATININE 1.53 (H) 07/30/2023   CREATININE 1.47 01/27/2023    Lab Results  Component Value Date   WBC 7.7 07/30/2023   HGB 16.0 07/30/2023   HCT 48.8 07/30/2023   PLT 178.0 07/30/2023   GLUCOSE 93 01/28/2024   CHOL 160 01/28/2024   TRIG 117 01/28/2024   HDL 51 01/28/2024   LDLDIRECT 110.0 07/30/2023   LDLCALC 88 01/28/2024   ALT 22 01/28/2024   AST 19 01/28/2024   NA 138 01/28/2024   K 4.4 01/28/2024   CL 105 01/28/2024   CREATININE 1.44 (H) 01/28/2024   BUN 16 01/28/2024   CO2 26 01/28/2024   TSH 1.41 07/30/2023   PSA 1.26 01/28/2024   HGBA1C 5.5 01/28/2024   MICROALBUR <0.7 07/30/2023    DG Tibia/Fibula Right Result Date: 09/19/2017 CLINICAL DATA:  Infection/cellulitis. EXAM: RIGHT TIBIA AND FIBULA - 2 VIEW COMPARISON:  No recent prior. FINDINGS: No acute  bony or joint abnormality. No evidence of fracture or dislocation. Diffuse soft tissue swelling. IMPRESSION: Diffuse soft tissue swelling.  No acute bony or joint abnormality. Electronically Signed   By: Debby  Register   On: 09/19/2017 13:21    Assessment & Plan:  .There are no diagnoses linked to this encounter.   I spent 34 minutes on the day of this face to face encounter reviewing patient's  most recent visit with cardiology,  nephrology,  and neurology,  prior relevant surgical and non surgical procedures, recent  labs and imaging studies, counseling on weight management,  reviewing the assessment and plan with patient, and post visit ordering and reviewing of  diagnostics and therapeutics with patient  .    Follow-up: No follow-ups on file.   Verneita LITTIE Kettering, MD

## 2024-01-30 ENCOUNTER — Encounter: Payer: Self-pay | Admitting: Internal Medicine

## 2024-03-17 NOTE — Progress Notes (Signed)
 George Fowler                                          MRN: [id number]   03/17/2024   The [facility] Quality Team Specialist reviewed this patient medical record for the purposes of chart review for care gap closure. The following were reviewed: chart review for care gap closure-colorectal cancer screening and diabetic eye exam.    [facility] Quality Team

## 2024-03-17 NOTE — Progress Notes (Signed)
 George Fowler                                          MRN: [id number]   03/17/2024   The [facility] Quality Team Specialist reviewed this patient medical record for the purposes of chart review for care gap closure. The following were reviewed: abstraction for care gap closure-glycemic status assessment.    [facility] Quality Team

## 2024-04-17 ENCOUNTER — Other Ambulatory Visit: Payer: Self-pay | Admitting: Internal Medicine

## 2024-04-21 ENCOUNTER — Telehealth: Payer: Self-pay | Admitting: Internal Medicine

## 2024-04-21 NOTE — Telephone Encounter (Signed)
 Patient's handicap placard renewal form is up front in Dr Lula color folder.

## 2024-04-21 NOTE — Telephone Encounter (Signed)
Placed in quick sign folder.  

## 2024-04-21 NOTE — Telephone Encounter (Signed)
 Pt is aware that form is complete

## 2024-07-27 ENCOUNTER — Other Ambulatory Visit

## 2024-07-29 ENCOUNTER — Ambulatory Visit: Admitting: Internal Medicine
